# Patient Record
Sex: Female | Born: 1935 | Race: White | Hispanic: No | Smoking: Never smoker
Health system: Southern US, Community
[De-identification: ages and names within clinical notes are randomized; demographics above are authoritative.]

## PROBLEM LIST (undated history)

## (undated) DIAGNOSIS — K648 Other hemorrhoids: Secondary | ICD-10-CM

## (undated) DIAGNOSIS — I10 Essential (primary) hypertension: Secondary | ICD-10-CM

## (undated) DIAGNOSIS — I509 Heart failure, unspecified: Secondary | ICD-10-CM

## (undated) DIAGNOSIS — M1990 Unspecified osteoarthritis, unspecified site: Secondary | ICD-10-CM

## (undated) DIAGNOSIS — Z86010 Personal history of colonic polyps: Secondary | ICD-10-CM

## (undated) DIAGNOSIS — M4800 Spinal stenosis, site unspecified: Secondary | ICD-10-CM

## (undated) DIAGNOSIS — K5792 Diverticulitis of intestine, part unspecified, without perforation or abscess without bleeding: Secondary | ICD-10-CM

## (undated) DIAGNOSIS — I272 Pulmonary hypertension, unspecified: Principal | ICD-10-CM

## (undated) DIAGNOSIS — I4891 Unspecified atrial fibrillation: Secondary | ICD-10-CM

## (undated) DIAGNOSIS — N390 Urinary tract infection, site not specified: Secondary | ICD-10-CM

## (undated) DIAGNOSIS — R32 Unspecified urinary incontinence: Secondary | ICD-10-CM

## (undated) HISTORY — DX: Urinary tract infection, site not specified: N39.0

## (undated) HISTORY — DX: Unspecified urinary incontinence: R32

## (undated) HISTORY — DX: Personal history of colonic polyps: Z86.010

## (undated) HISTORY — DX: Pulmonary hypertension, unspecified: I27.20

## (undated) HISTORY — DX: Essential (primary) hypertension: I10

## (undated) HISTORY — DX: Unspecified atrial fibrillation: I48.91

## (undated) HISTORY — PX: CATARACT EXTRACTION: SUR2

## (undated) HISTORY — DX: Unspecified osteoarthritis, unspecified site: M19.90

## (undated) HISTORY — DX: Other hemorrhoids: K64.8

## (undated) HISTORY — DX: Spinal stenosis, site unspecified: M48.00

## (undated) HISTORY — DX: Heart failure, unspecified: I50.9

## (undated) HISTORY — DX: Diverticulitis of intestine, part unspecified, without perforation or abscess without bleeding: K57.92

## (undated) HISTORY — PX: VAGINAL HYSTERECTOMY: SHX2639

---
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
## 1968-03-14 HISTORY — PX: GALLBLADDER SURGERY: SHX652

## 2008-03-14 HISTORY — PX: BREAST BIOPSY: SHX20

## 2014-10-20 ENCOUNTER — Encounter: Payer: Self-pay | Admitting: Adult Health

## 2014-10-20 ENCOUNTER — Ambulatory Visit (INDEPENDENT_AMBULATORY_CARE_PROVIDER_SITE_OTHER): Payer: Medicare Other | Admitting: Adult Health

## 2014-10-20 VITALS — BP 138/80 | Temp 98.5°F | Ht 63.25 in | Wt 183.0 lb

## 2014-10-20 DIAGNOSIS — M1990 Unspecified osteoarthritis, unspecified site: Secondary | ICD-10-CM

## 2014-10-20 DIAGNOSIS — Z7189 Other specified counseling: Secondary | ICD-10-CM

## 2014-10-20 DIAGNOSIS — I509 Heart failure, unspecified: Secondary | ICD-10-CM

## 2014-10-20 DIAGNOSIS — N390 Urinary tract infection, site not specified: Secondary | ICD-10-CM

## 2014-10-20 DIAGNOSIS — I50813 Acute on chronic right heart failure: Secondary | ICD-10-CM | POA: Insufficient documentation

## 2014-10-20 DIAGNOSIS — I482 Chronic atrial fibrillation, unspecified: Secondary | ICD-10-CM

## 2014-10-20 DIAGNOSIS — Z7689 Persons encountering health services in other specified circumstances: Secondary | ICD-10-CM

## 2014-10-20 DIAGNOSIS — I10 Essential (primary) hypertension: Secondary | ICD-10-CM

## 2014-10-20 LAB — POCT URINALYSIS DIPSTICK
Bilirubin, UA: NEGATIVE
GLUCOSE UA: NEGATIVE
KETONES UA: NEGATIVE
Nitrite, UA: NEGATIVE
Urobilinogen, UA: 0.2
pH, UA: 5.5

## 2014-10-20 MED ORDER — METOPROLOL SUCCINATE ER 50 MG PO TB24: 50.0000 mg | ORAL_TABLET | Freq: Every day | ORAL | Status: DC

## 2014-10-20 MED ORDER — METOPROLOL SUCCINATE ER 25 MG PO TB24: 25.0000 mg | ORAL_TABLET | Freq: Every day | ORAL | Status: DC

## 2014-10-20 MED ORDER — LISINOPRIL 10 MG PO TABS: 10.0000 mg | ORAL_TABLET | Freq: Every day | ORAL | Status: DC

## 2014-10-20 MED ORDER — FUROSEMIDE 40 MG PO TABS: 40.0000 mg | ORAL_TABLET | Freq: Every day | ORAL | Status: DC

## 2014-10-20 MED ORDER — ELIQUIS 5 MG PO TABS: 5.0000 mg | ORAL_TABLET | Freq: Two times a day (BID) | ORAL | Status: DC

## 2014-10-20 MED ORDER — CIPROFLOXACIN HCL 500 MG PO TABS: 500.0000 mg | ORAL_TABLET | Freq: Two times a day (BID) | ORAL | Status: DC

## 2014-10-20 MED ORDER — MELOXICAM 15 MG PO TABS: 7.5000 mg | ORAL_TABLET | Freq: Every day | ORAL | Status: DC

## 2014-10-20 NOTE — Progress Notes (Signed)
HPI:  Alyssa Cameron is here to establish care. She is a very pleasant and spry 81 year old caucasian female.   Last PCP and physical: 1.5 years.   Has the following chronic problems that require follow up and concerns today:   Afib/CHF - She is currently taking Eliquis for her Afib and Lasix for her CHF. She was followed by Cardiology in [place] and would like to follow up with cardiology in [place]. She feels as though she is controlled on both medications.   Chronic UTI - Was followed by Urology in [place] and would like a referral placed for Urology in [place]. She does endorse having burning with urination currently. Denies any other symptoms.   Arthritis - She has arthritic pain in bilateral knees, right elbow and bilateral wrists. Is currently taking Tylenol for her arthritis.     ROS negative for unless reported above: fevers, chills,feeling poorly, unintentional weight loss, hearing or vision loss, chest pain, palpitations, leg claudication, struggling to breath,Not feeling congested in the chest, no orthopenia, no cough,no wheezing, normal appetite, no soft tissue swelling, no hemoptysis, melena, hematochezia, hematuria, falls, loc, si, or thoughts of self harm.  Immunizations: Needs PNA shingles. But unsure if had chickenpox.  Diet:eats a healthy diet Exercise: Does PT Colonoscopy:2 years ago - has hx polyps Dexa: "many years ago Mammogram:2014 Dentist: Will schedule.   No past medical history on file.  No past surgical history on file.  No family history on file.  History   Social History  . Marital Status: Widowed    Spouse Name: N/A  . Number of Children: N/A  . Years of Education: N/A   Social History Main Topics  . Smoking status: Never Smoker   . Smokeless tobacco: Not on file  . Alcohol Use: No  . Drug Use: No  . Sexual Activity: Not on file   Other Topics Concern  . Not on file   Social History Narrative  . No narrative on file     Current  outpatient prescriptions:  .  acetaminophen (TYLENOL) 500 MG tablet, Take 500 mg by mouth every 8 (eight) hours as needed., Disp: , Rfl:  .  ELIQUIS 5 MG TABS tablet, Take 5 mg by mouth 2 (two) times daily., Disp: , Rfl: 0 .  furosemide (LASIX) 40 MG tablet, Take 40 mg by mouth., Disp: , Rfl:  .  lisinopril (PRINIVIL,ZESTRIL) 10 MG tablet, Take 10 mg by mouth daily., Disp: , Rfl: 0 .  LORazepam (ATIVAN) 0.5 MG tablet, Take 0.5 mg by mouth at bedtime., Disp: , Rfl:  .  metoprolol succinate (TOPROL-XL) 25 MG 24 hr tablet, Take 1 tablet by mouth 3 (three) times daily., Disp: , Rfl: 0 .  metoprolol succinate (TOPROL-XL) 50 MG 24 hr tablet, Take 50 mg by mouth 3 (three) times daily., Disp: , Rfl: 0  EXAM:  Filed Vitals:   10/20/14 1250  BP: 138/80  Temp: 98.5 F (36.9 C)    Body mass index is 32.14 kg/(m^2).  GENERAL: vitals reviewed and listed above, alert, oriented, appears well hydrated and in no acute distress  HEENT: atraumatic, conjunttiva clear, no obvious abnormalities on inspection of external nose and ears  NECK: Neck is soft and supple without masses, no adenopathy or thyromegaly, trachea midline, no JVD. Normal range of motion.   LUNGS: clear to auscultation bilaterally, no wheezes, rales or rhonchi, good air movement  CV: A-fib,   no audible murmurs, gallops, or rubs. No carotid bruit and  slight peripheral edema.   MS: moves all extremities without noticeable abnormality. Has steady gate with walker. Can get onto exam table with liminal assistance  Abd: soft/nontender/nondistended/normal bowel sounds   Skin: warm and dry, no rash   Extremities: No clubbing, cyanosis, or edema. Capillary refill is WNL. Pulses intact bilaterally in upper and lower extremities.   Neuro: CN II-XII intact, sensation and reflexes normal throughout, 5/5 muscle strength in bilateral upper and lower extremities. Normal finger to nose. Normal rapid alternating movements.    PSYCH: pleasant and  cooperative, no obvious depression or anxiety  ASSESSMENT AND PLAN:  1. Encounter to establish care - Ambulatory referral to Cardiology - Ambulatory referral to Urology - POCT urinalysis dipstick - Follow up in one month for CPE  2. Recurrent UTI - POCT urinalysis dipstick; Standing - Culture, Urine - POCT urinalysis dipstick -- ciprofloxacin (CIPRO) 500 MG tablet; Take 1 tablet (500 mg total) by mouth 2 (two) times daily.  Dispense: 6 tablet; Refill: 0  3. Chronic atrial fibrillation - Ambulatory referral to Cardiology - ELIQUIS 5 MG TABS tablet; Take 1 tablet (5 mg total) by mouth 2 (two) times daily.  Dispense: 180 tablet; Refill: 1  4. Chronic congestive heart failure, unspecified congestive heart failure type  - Ambulatory referral to Cardiology - furosemide (LASIX) 40 MG tablet; Take 1 tablet (40 mg total) by mouth daily.  Dispense: 90 tablet; Refill: 1  5. Essential hypertension - Controlled - no change - Ambulatory referral to Cardiology - lisinopril (PRINIVIL,ZESTRIL) 10 MG tablet; Take 1 tablet (10 mg total) by mouth daily.  Dispense: 90 tablet; Refill: 1 - metoprolol succinate (TOPROL-XL) 25 MG 24 hr tablet; Take 1 tablet (25 mg total) by mouth daily.  Dispense: 90 tablet; Refill: 1 - metoprolol succinate (TOPROL-XL) 50 MG 24 hr tablet; Take 1 tablet (50 mg total) by mouth daily.  Dispense: 90 tablet; Refill: 1  6. Arthritis - meloxicam (MOBIC) 15 MG tablet; Take 0.5 tablets (7.5 mg total) by mouth daily.  Dispense: 30 tablet; Refill: 1   -We reviewed the PMH, PSH, FH, SH, Meds and Allergies. -We provided refills for any medications we will prescribe as needed. -We addressed current concerns per orders and patient instructions. -We have asked for records for pertinent exams, studies, vaccines and notes from previous providers. -We have advised patient to follow up per instructions below.   -Patient advised to return or notify a provider immediately if symptoms  worsen or persist or new concerns arise.  There are no Patient Instructions on file for this visit.   Shirline Frees, AGNP

## 2014-10-20 NOTE — Progress Notes (Signed)
Pre visit review using our clinic review tool, if applicable. No additional management support is needed unless otherwise documented below in the visit note. 

## 2014-10-20 NOTE — Patient Instructions (Signed)
It was so nice to meet you today!  Please follow up with me in one month for a physical.   I have placed the referrals and they will call you to make appointments.   I have sent a prescription for Mobic to the pharmacy, please take one half a pill as directed.   Please let me know if you need anything in the meantime.

## 2014-10-21 ENCOUNTER — Telehealth: Payer: Self-pay | Admitting: Adult Health

## 2014-10-21 NOTE — Telephone Encounter (Signed)
[facility] received orders for home health yesterday, they need copy of OV notes from yesterday and copy of demographic sheet and insurance card.  Fax:  [phone]

## 2014-10-21 NOTE — Telephone Encounter (Signed)
This has already been done.

## 2014-10-23 LAB — URINE CULTURE: Colony Count: >=100000

## 2014-10-24 ENCOUNTER — Telehealth: Payer: Self-pay | Admitting: Adult Health

## 2014-10-24 NOTE — Telephone Encounter (Signed)
Physical Therapist Byrd Hesselbach with [facility] calling to notify pcp that pt was seen and evaluated today for home health physical therapy.  Byrd Hesselbach is requesting a verbal order for strength training, transfer balance and gait training for 1 time a week for this week and then 2 times per week for the next 6 weeks starting next week.  Please call her back to provide verbal order.

## 2014-10-27 ENCOUNTER — Telehealth: Payer: Self-pay | Admitting: Adult Health

## 2014-10-27 DIAGNOSIS — I509 Heart failure, unspecified: Secondary | ICD-10-CM

## 2014-10-27 MED ORDER — FUROSEMIDE 40 MG PO TABS: 40.0000 mg | ORAL_TABLET | Freq: Every day | ORAL | Status: DC

## 2014-10-27 NOTE — Telephone Encounter (Signed)
ok 

## 2014-10-27 NOTE — Telephone Encounter (Signed)
Ok to give verbal 

## 2014-10-27 NOTE — Telephone Encounter (Signed)
Pt's rx furosemide (LASIX) 40 MG tablet [facility] but [facility] is [facility].  [facility]ne [facility] until 8/18 pt needs enough to get her through.  Also daughter has a med list from 2014 that states pt is taking potassium 20 MEQ dated 2014. Pt was taking one /day But not on current med list. . pls advise..  [facility] [place]

## 2014-10-27 NOTE — Telephone Encounter (Signed)
Rx sent to local pharmacy.  Cory please advise on potassium 20 meq. Per pt's daughter she has been on this medication continuously since 2014 and is running low on this medication  If approved, a 30 day supply to local pharmacy and 90 day to mail order.

## 2014-10-27 NOTE — Telephone Encounter (Signed)
OK to refill both. I saw that her next appointment in a 15 min follow up. This was supposed to be a CPE.

## 2014-10-28 NOTE — Telephone Encounter (Signed)
Called and left a verbal ok for pt's PT on personalized vm.

## 2014-10-28 NOTE — Telephone Encounter (Addendum)
Sorry about that [place], the appt notes states fup, but it was scheduled as a CPE. I corrected my mistake, it is definitely a physical. Again, I apologize!  By the way, is pt to have labs that day? Fasting?

## 2014-10-29 ENCOUNTER — Telehealth: Payer: Self-pay | Admitting: Adult Health

## 2014-10-29 MED ORDER — POTASSIUM CHLORIDE CRYS ER 20 MEQ PO TBCR: 20.0000 meq | EXTENDED_RELEASE_TABLET | Freq: Once | ORAL | Status: DC

## 2014-10-29 NOTE — Telephone Encounter (Signed)
error 

## 2014-10-29 NOTE — Telephone Encounter (Signed)
Spoke with pt and pt is aware

## 2014-10-29 NOTE — Telephone Encounter (Signed)
Rx sent to mail order for 90 day supply and 30 day to the local pharmacy. Per Kandee Keen pt does not need to fast that day.  We will get labs another day.

## 2014-10-30 ENCOUNTER — Encounter: Payer: Self-pay | Admitting: Adult Health

## 2014-10-30 ENCOUNTER — Telehealth: Payer: Self-pay

## 2014-10-30 NOTE — Telephone Encounter (Signed)
[facility] called to get verbal ordres for occupational therapy.  She would like to see the patient once for 1 week and then twice for 4 weeks.  Ok per [place] to give verbal order.  Malori is aware.

## 2014-10-31 ENCOUNTER — Telehealth: Payer: Self-pay | Admitting: Adult Health

## 2014-10-31 NOTE — Telephone Encounter (Signed)
Sonya from [facility] call to say pt need prior authorization for the following med ELIQUIS 5 MG TABS tablet

## 2014-10-31 NOTE — Telephone Encounter (Signed)
Pls advise.  

## 2014-11-03 NOTE — Telephone Encounter (Signed)
PA is pending with Cover My Meds. Key is [id number].

## 2014-11-03 NOTE — Telephone Encounter (Signed)
Pt following up on med request ELIQUIS 5 MG TABS tablet [facility]

## 2014-11-04 ENCOUNTER — Telehealth: Payer: Self-pay | Admitting: Adult Health

## 2014-11-04 NOTE — Telephone Encounter (Signed)
Called and spoke with pt and pt states the bottle says take 1/2 tablet by mouth daily.  Pt states her daughter wrote take 0.5 tablet bid.  Per Cory's last office note pt should take 0.5 tablet daily.  Also advised pt that this Mobic will take the place of Tylenol.  Advised pt to call the office with further questions or concerns.   Pt states before she came here she was taking MegTab 84 mg and she cannot find it down here.  Pt states [facility] told her they could order it.  Pt would like to know if she should be taking it at all? Pls advise.

## 2014-11-04 NOTE — Telephone Encounter (Signed)
Pt has questions about her rx and would like a call back   meloxicam (MOBIC) 15 MG tablet

## 2014-11-04 NOTE — Telephone Encounter (Signed)
We will check her Magnesium level during her physical.

## 2014-11-05 NOTE — Telephone Encounter (Signed)
Called and spoke with pt and pt is aware.  

## 2014-11-17 NOTE — Progress Notes (Signed)
Cardiology Office Note   Date:  11/18/2014   ID:  Alyssa Cameron, DOB 1918-07-15, MRN [id number]  PCP:  Shirline Frees, NP  Cardiologist:   Madilyn Hook, MD   Chief Complaint  Patient presents with  . Advice Only    NP- consult. here to get established.      History of Present Illness: Alyssa Cameron is a 81 y.o. female with hypertension, heart failure, and atrial fibrillation who presents to establish care.  Alyssa Cameron recently moved to [place] from [place] and presents to establish care.  She is feeling well and is without complaint at this time.  She was diagnosed with atrial fibrillation approximately 3 years ago.  At that time she presented with heart failure.  She is unsure whether this was systolic or diastolic heart failure.  After starting metoprolol and diuresis she has done much better.  She denies palpitations, lightheadedness, CP, shortness of breath, LE edema, orthopnea or PND.  She moved to [place] to live with her daughter who accompanies her today.  She has been taking apixaban for anticoagulation and denies any complications.  Prior to that she was on warfarin.     Past Medical History  Diagnosis Date  . Hypertension   . A-fib   . Spinal stenosis   . History of colon polyps   . CHF (congestive heart failure)   . Arthritis   . Urine incontinence   . Frequent UTI   . Diverticulitis   . Hx of colonic polyps     2013 - 2 tubular adenoma sigmoid and Transverse polyp  . Internal hemorrhoids     Past Surgical History  Procedure Laterality Date  . Cataract extraction    . Gallbladder surgery  1970  . Breast biopsy  2010  . Appendectomy  1941  . Tonsillectomy and adenoidectomy  1941  . Vaginal hysterectomy       Current Outpatient Prescriptions  Medication Sig Dispense Refill  . acetaminophen (TYLENOL) 500 MG tablet Take 500 mg by mouth every 8 (eight) hours as needed.    Marland Kitchen ELIQUIS 5 MG TABS tablet Take 1 tablet (5 mg total) by mouth 2 (two) times daily.  180 tablet 1  . furosemide (LASIX) 40 MG tablet Take 1 tablet (40 mg total) by mouth daily. 30 tablet 0  . lisinopril (PRINIVIL,ZESTRIL) 10 MG tablet Take 1 tablet (10 mg total) by mouth daily. 90 tablet 1  . Melatonin 3 MG TABS Take 1 tablet by mouth at bedtime as needed.    . meloxicam (MOBIC) 15 MG tablet Take 0.5 tablets (7.5 mg total) by mouth daily. 30 tablet 1  . metoprolol succinate (TOPROL-XL) 50 MG 24 hr tablet Take 1 tablet (50 mg total) by mouth daily. 90 tablet 1  . potassium chloride SA (K-DUR,KLOR-CON) 20 MEQ tablet Take 1 tablet (20 mEq total) by mouth once. 90 tablet 1   No current facility-administered medications for this visit.    Allergies:   Demerol and Vicodin    Social History:  The patient  reports that she has never smoked. She does not have any smokeless tobacco history on file. She reports that she does not drink alcohol or use illicit drugs.   Family History:  The patient's family history includes Breast cancer in an other family member; Heart failure in her father and mother; Lung cancer in an other family member; Stroke in an other family member.    ROS:  Please see the history of present  illness.  All other systems are reviewed and were positive for foot numbness and knee pain due to arthritis.  Otherwise review of systems was negative.    PHYSICAL EXAM: VS:  BP 152/74 mmHg  Pulse 98  Ht 5\' 6"  (1.676 m)  Wt 86.183 kg (190 lb)  BMI 30.68 kg/m2 , BMI Body mass index is 30.68 kg/(m^2). GENERAL:  Well appearing HEENT:  Pupils equal round and reactive, fundi not visualized, oral mucosa unremarkable NECK:  No jugular venous distention, waveform within normal limits, carotid upstroke brisk and symmetric, no bruits, no thyromegaly LYMPHATICS:  No cervical adenopathy LUNGS:  Clear to auscultation bilaterally HEART:  Irregularly irregular.  PMI not displaced or sustained,S1 and S2 within normal limits, no S3, no S4, no clicks, no rubs, no murmurs ABD:  Flat,  positive bowel sounds normal in frequency in pitch, no bruits, no rebound, no guarding, no midline pulsatile mass, no hepatomegaly, no splenomegaly EXT:  2 plus pulses throughout, no edema, no cyanosis no clubbing SKIN:  No rashes no nodules NEURO:  Cranial nerves II through XII grossly intact, motor grossly intact throughout PSYCH:  Cognitively intact, oriented to person place and time    EKG:  EKG is ordered today. The ekg ordered today demonstrates atrial fibrillation rate 98 bpm.  Low voltage precordial leads.     Recent Labs: No results found for requested labs within last 365 days.    Lipid Panel No results found for: CHOL, TRIG, HDL, CHOLHDL, VLDL, LDLCALC, LDLDIRECT    Wt Readings from Last 3 Encounters:  11/18/14 86.183 kg (190 lb)  10/20/14 83.008 kg (183 lb)      Other studies Reviewed: Additional studies/ records that were reviewed today include:  Review of the above records demonstrates:  Please see elsewhere in the note.   ASSESSMENT AND PLAN:  # Chronic Atrial fibrillation: Rate controlled and patient tolerating metoprolol and apixaban.  Continue current management.  # Hypertension: BP above goal today.  She brings a list of her home BP readings which have been within normal range.  We will not make any changes based on this one reading, as she reports some symptomatic low BPs in the past. - Continue metoprolol and lisinopril  - Patient to continue to monitor home BP  # Chronic heart failure, type unknown:  Euvolemic today.  Will obtain outside records. - Continue furosemide  - Continue metoprolol and lisinopril - check K and Mg to appropriately dose supplementation  #  CV disease prevention: Patient requests that lipids be checked today.  Current medicines are reviewed at length with the patient today.  The patient does not have concerns regarding medicines.  The following changes have been made:  no change  Labs/ tests ordered today include:    Orders Placed This Encounter  Procedures  . Comprehensive metabolic panel  . Lipid panel  . Magnesium  . EKG 12-Lead     Disposition:   FU with Dr. Elmarie Shiley C. Duke Salvia in 1 year   Signed, Madilyn Hook, MD  11/18/2014 8:36 PM    [facility]

## 2014-11-18 ENCOUNTER — Ambulatory Visit (INDEPENDENT_AMBULATORY_CARE_PROVIDER_SITE_OTHER): Payer: Medicare Other | Admitting: Cardiovascular Disease

## 2014-11-18 ENCOUNTER — Encounter: Payer: Self-pay | Admitting: Cardiovascular Disease

## 2014-11-18 VITALS — BP 152/74 | HR 98 | Ht 66.0 in | Wt 190.0 lb

## 2014-11-18 DIAGNOSIS — I482 Chronic atrial fibrillation, unspecified: Secondary | ICD-10-CM

## 2014-11-18 DIAGNOSIS — Z79899 Other long term (current) drug therapy: Secondary | ICD-10-CM

## 2014-11-18 DIAGNOSIS — I10 Essential (primary) hypertension: Secondary | ICD-10-CM | POA: Diagnosis not present

## 2014-11-18 NOTE — Patient Instructions (Signed)
Your physician recommends that you return for lab work fasting. You will receive results via phone call or by mail if you are not signed up with my chart.   Your physician wants you to follow-up in: 1 year or sooner if needed with Dr. Duke Salvia. You will receive a reminder letter in the mail two months in advance. If you don't receive a letter, please call our office to schedule the follow-up appointment.

## 2014-11-19 ENCOUNTER — Encounter: Payer: Self-pay | Admitting: Adult Health

## 2014-11-19 ENCOUNTER — Ambulatory Visit (INDEPENDENT_AMBULATORY_CARE_PROVIDER_SITE_OTHER): Payer: Medicare Other | Admitting: Adult Health

## 2014-11-19 VITALS — BP 120/80 | Temp 98.4°F | Ht 66.0 in | Wt 187.7 lb

## 2014-11-19 DIAGNOSIS — Z0000 Encounter for general adult medical examination without abnormal findings: Secondary | ICD-10-CM

## 2014-11-19 DIAGNOSIS — I10 Essential (primary) hypertension: Secondary | ICD-10-CM

## 2014-11-19 DIAGNOSIS — Z23 Encounter for immunization: Secondary | ICD-10-CM | POA: Diagnosis not present

## 2014-11-19 NOTE — Patient Instructions (Signed)
It was great seeing you again!  I will follow up with you regarding your labs when I get the results.   Come back in 6 months for a follow up. If you need anything in the meantime, please let me know.

## 2014-11-19 NOTE — Progress Notes (Signed)
Subjective:  Patient presents for yearly medicare wellness exam Medicare questionnaire was completed and reviewed  All immunizations and health maintenance protocols were reviewed with the patient and needed orders were placed.  Appropriate screening laboratory values were ordered for the patient including screening of hyperlipidemia, renal function and hepatic function.  Medication reconciliation,  past medical history, social history, problem list and allergies were reviewed in detail with the patient  Goals were established with regard to weight loss, exercise, and  diet in compliance with medications  End of life planning was discussed.   Preventive Screening-Counseling & Management  Smoking Status: Never Smoker Second Hand Smoking status: No smokers in home  Risk Factors Regular exercise: None Diet: Does not follow specific diet Fall Risk: Yes, impaired mobility  Cardiac risk factors:  advanced age (older than 66 for men, 55 for women)  Hyperlipidemia : no No diabetes.  Family History: Father and Mother had heart disease  Depression Screen None. PHQ2 0   Activities of Daily Living Independent ADLs and IADLs   Hearing Difficulties: Has trouble understanding soft voices  Cognitive Testing No reported trouble.   Normal 3 word recall  List the Names of Other Physician/Practitioners you currently use: 1.Cardiology - [facility]ilton Si 2. Optho - Dr. Wallace Cullens - [place], [place] 3. Urology- Dr. Annabell Howells  Immunization History  Administered Date(s) Administered  . Influenza, High Dose Seasonal PF 10/26/2014   Required Immunizations needed today: Prevnar 13  Screening tests- up to date Health Maintenance Due  Topic Date Due  . TETANUS/TDAP  01/31/1940  . ZOSTAVAX  01/30/1981  . DEXA SCAN  01/30/1986  . PNA vac Low Risk Adult (1 of 2 - PCV13) 01/30/1986    ROS- No pertinent positives discovered in course of AWV  The following were reviewed and  entered/updated in epic: Past Medical History  Diagnosis Date  . Hypertension   . A-fib   . Spinal stenosis   . History of colon polyps   . CHF (congestive heart failure)   . Arthritis   . Urine incontinence   . Frequent UTI   . Diverticulitis   . Hx of colonic polyps     2013 - 2 tubular adenoma sigmoid and Transverse polyp  . Internal hemorrhoids    Patient Active Problem List   Diagnosis Date Noted  . A-fib 10/20/2014  . CHF (congestive heart failure) 10/20/2014  . Essential hypertension 10/20/2014  . Arthritis 10/20/2014  . Chronic UTI 10/20/2014   Past Surgical History  Procedure Laterality Date  . Cataract extraction    . Gallbladder surgery  1970  . Breast biopsy  2010  . Appendectomy  1941  . Tonsillectomy and adenoidectomy  1941  . Vaginal hysterectomy      Family History  Problem Relation Age of Onset  . Heart failure Mother     73  . Heart failure Father     38  . Breast cancer    . Lung cancer    . Stroke      Medications- reviewed and updated Current Outpatient Prescriptions  Medication Sig Dispense Refill  . acetaminophen (TYLENOL) 500 MG tablet Take 500 mg by mouth every 8 (eight) hours as needed.    Marland Kitchen ELIQUIS 5 MG TABS tablet Take 1 tablet (5 mg total) by mouth 2 (two) times daily. 180 tablet 1  . furosemide (LASIX) 40 MG tablet Take 1 tablet (40 mg total) by mouth daily. 30 tablet 0  . lisinopril (PRINIVIL,ZESTRIL) 10 MG tablet  Take 1 tablet (10 mg total) by mouth daily. 90 tablet 1  . Melatonin 3 MG TABS Take 1 tablet by mouth at bedtime as needed.    . meloxicam (MOBIC) 15 MG tablet Take 0.5 tablets (7.5 mg total) by mouth daily. 30 tablet 1  . metoprolol succinate (TOPROL-XL) 50 MG 24 hr tablet Take 1 tablet (50 mg total) by mouth daily. 90 tablet 1  . potassium chloride SA (K-DUR,KLOR-CON) 20 MEQ tablet Take 1 tablet (20 mEq total) by mouth once. 90 tablet 1   No current facility-administered medications for this visit.     Allergies-reviewed and updated Allergies  Allergen Reactions  . Demerol [Meperidine] Nausea Only  . Vicodin [Hydrocodone-Acetaminophen] Nausea And Vomiting    Social History   Social History  . Marital Status: Widowed    Spouse Name: N/A  . Number of Children: N/A  . Years of Education: N/A   Social History Main Topics  . Smoking status: Never Smoker   . Smokeless tobacco: None  . Alcohol Use: No  . Drug Use: No  . Sexual Activity: Not Asked   Other Topics Concern  . None   Social History Narrative   Retired from [employer] in [place] and moved here from [place]   2 daughter and 2 sons   Likes to read and do puzzles    Objective: BP 120/80 mmHg  Temp(Src) 98.4 F (36.9 C) (Oral)  Ht 5\' 6"  (1.676 m)  Wt 187 lb 11.2 oz (85.14 kg)  BMI 30.31 kg/m2 GENERAL: vitals reviewed and listed above, alert, oriented, appears well hydrated and in no acute distress  HEENT: atraumatic, conjunttiva clear, no obvious abnormalities on inspection of external nose and ears. TM's visualized, no cerumen impaction.   NECK: Neck is soft and supple without masses, no adenopathy or thyromegaly, trachea midline, no JVD. Normal range of motion.   LUNGS: clear to auscultation bilaterally, no wheezes, rales or rhonchi, good air movement  CV: A-fib,no audible murmurs, gallops, or rubs. No carotid bruit and slight peripheral edema.   MS: moves all extremities without noticeable abnormality. Has steady gate with walker. Can get onto exam table with minimal assistance  Abd: soft/nontender/nondistended/normal bowel sounds   Skin: warm and dry, no rash   Extremities: No clubbing, cyanosis, or edema. Capillary refill is WNL. Pulses intact bilaterally in upper and lower extremities.   Neuro: CN II-XII intact, sensation and reflexes normal throughout, 5/5 muscle strength in bilateral upper and lower extremities. Normal finger to nose. Normal rapid alternating movements.   PSYCH: pleasant  and cooperative, no obvious depression or anxiety  Assessment/Plan:  1. Encounter for Medicare annual wellness exam - Follow up in one year for MWE - Follow up in 6 months for check up - Follow up sooner if needed  2. Essential hypertension - Controlled with current regimen - no change - CBC with Differential - Hepatic function panel - TSH  3. Need for pneumococcal vaccination - Pneumococcal conjugate vaccine 13-valent IM   This is a list of the screening recommended for you and due dates:  Health Maintenance  Topic Date Due  . Tetanus Vaccine  01/31/1940  . Shingles Vaccine  01/30/1981  . DEXA scan (bone density measurement)  01/30/1986  . Flu Shot  10/13/2015  . Pneumonia vaccines (2 of 2 - PPSV23) 11/19/2015  . Colon Cancer Screening  01/18/2022      Return precautions advised.

## 2014-11-21 ENCOUNTER — Other Ambulatory Visit: Payer: Self-pay | Admitting: Adult Health

## 2014-11-21 LAB — HEPATIC FUNCTION PANEL
ALK PHOS: 76 U/L (ref 33–130)
ALT: 22 U/L (ref 6–29)
AST: 23 U/L (ref 10–35)
Albumin: 4 g/dL (ref 3.6–5.1)
BILIRUBIN INDIRECT: 0.5 mg/dL (ref 0.2–1.2)
BILIRUBIN TOTAL: 0.7 mg/dL (ref 0.2–1.2)
Bilirubin, Direct: 0.2 mg/dL (ref <=0.2)
Total Protein: 6.5 g/dL (ref 6.1–8.1)

## 2014-11-21 LAB — CBC WITH DIFFERENTIAL/PLATELET
BASOS ABS: 0 10*3/uL (ref 0.0–0.1)
BASOS PCT: 0 % (ref 0–1)
EOS ABS: 0.1 10*3/uL (ref 0.0–0.7)
Eosinophils Relative: 2 % (ref 0–5)
HCT: 36 % (ref 36.0–46.0)
Hemoglobin: 11.7 g/dL — ABNORMAL LOW (ref 12.0–15.0)
LYMPHS ABS: 1 10*3/uL (ref 0.7–4.0)
Lymphocytes Relative: 17 % (ref 12–46)
MCH: 31 pg (ref 26.0–34.0)
MCHC: 32.5 g/dL (ref 30.0–36.0)
MCV: 95.5 fL (ref 78.0–100.0)
MPV: 10.4 fL (ref 8.6–12.4)
Monocytes Absolute: 0.4 10*3/uL (ref 0.1–1.0)
Monocytes Relative: 6 % (ref 3–12)
NEUTROS ABS: 4.4 10*3/uL (ref 1.7–7.7)
NEUTROS PCT: 75 % (ref 43–77)
PLATELETS: 174 10*3/uL (ref 150–400)
RBC: 3.77 MIL/uL — ABNORMAL LOW (ref 3.87–5.11)
RDW: 13.4 % (ref 11.5–15.5)
WBC: 5.9 10*3/uL (ref 4.0–10.5)

## 2014-11-21 LAB — TSH: TSH: 1.609 u[IU]/mL (ref 0.350–4.500)

## 2014-11-22 LAB — LIPID PANEL
CHOLESTEROL: 159 mg/dL (ref 125–200)
HDL: 56 mg/dL (ref >=46)
LDL CALC: 83 mg/dL (ref <130)
Total CHOL/HDL Ratio: 2.8 Ratio (ref <=5.0)
Triglycerides: 98 mg/dL (ref <150)
VLDL: 20 mg/dL (ref <30)

## 2014-11-22 LAB — URINALYSIS, ROUTINE W REFLEX MICROSCOPIC
Bilirubin Urine: NEGATIVE
Glucose, UA: NEGATIVE
Ketones, ur: NEGATIVE
NITRITE: NEGATIVE
PH: 5 (ref 5.0–8.0)
Protein, ur: NEGATIVE
SPECIFIC GRAVITY, URINE: 1.017 (ref 1.001–1.035)

## 2014-11-22 LAB — URINALYSIS, MICROSCOPIC ONLY
BACTERIA UA: NONE SEEN [HPF]
CRYSTALS: NONE SEEN [HPF]
Casts: NONE SEEN [LPF]
Yeast: NONE SEEN [HPF]

## 2014-11-22 LAB — COMPREHENSIVE METABOLIC PANEL
ALBUMIN: 4.3 g/dL (ref 3.6–5.1)
ALT: 21 U/L (ref 6–29)
AST: 22 U/L (ref 10–35)
Alkaline Phosphatase: 83 U/L (ref 33–130)
BUN: 30 mg/dL — ABNORMAL HIGH (ref 7–25)
CHLORIDE: 103 mmol/L (ref 98–110)
CO2: 27 mmol/L (ref 20–31)
Calcium: 9.7 mg/dL (ref 8.6–10.4)
Creat: 1.02 mg/dL — ABNORMAL HIGH (ref 0.60–0.88)
Glucose, Bld: 145 mg/dL — ABNORMAL HIGH (ref 65–99)
POTASSIUM: 4.7 mmol/L (ref 3.5–5.3)
Sodium: 139 mmol/L (ref 135–146)
TOTAL PROTEIN: 6.3 g/dL (ref 6.1–8.1)
Total Bilirubin: 0.7 mg/dL (ref 0.2–1.2)

## 2014-11-22 LAB — MAGNESIUM: MAGNESIUM: 1.9 mg/dL (ref 1.5–2.5)

## 2014-11-25 ENCOUNTER — Other Ambulatory Visit: Payer: Self-pay | Admitting: Adult Health

## 2014-11-25 MED ORDER — CIPROFLOXACIN HCL 250 MG PO TABS: 250.0000 mg | ORAL_TABLET | Freq: Two times a day (BID) | ORAL | Status: DC

## 2014-11-25 NOTE — Addendum Note (Signed)
Addended by: Azucena Freed on: 11/25/2014 03:23 PM   Modules accepted: Orders, Medications

## 2014-11-26 ENCOUNTER — Other Ambulatory Visit: Payer: Self-pay | Admitting: Adult Health

## 2014-12-01 ENCOUNTER — Encounter: Payer: Self-pay | Admitting: Adult Health

## 2014-12-01 ENCOUNTER — Telehealth: Payer: Self-pay | Admitting: Adult Health

## 2014-12-01 ENCOUNTER — Ambulatory Visit (INDEPENDENT_AMBULATORY_CARE_PROVIDER_SITE_OTHER): Payer: Medicare Other | Admitting: Adult Health

## 2014-12-01 VITALS — BP 130/70 | HR 82 | Wt 193.6 lb

## 2014-12-01 DIAGNOSIS — R600 Localized edema: Secondary | ICD-10-CM | POA: Diagnosis not present

## 2014-12-01 DIAGNOSIS — G479 Sleep disorder, unspecified: Secondary | ICD-10-CM

## 2014-12-01 MED ORDER — SPIRONOLACTONE 25 MG PO TABS: 25.0000 mg | ORAL_TABLET | Freq: Every day | ORAL | Status: DC

## 2014-12-01 NOTE — Telephone Encounter (Signed)
Referral to see Chilton Si for a echocardiogram

## 2014-12-01 NOTE — Patient Instructions (Addendum)
It was great seeing you again!  - Please follow up with cardiology and let them know that you have more edema in your legs and see if they want to do an Echocardiogram.   -I will start you on a medication call Aldactone. Take this every other day. Stay hydrated   - Try taking 6 mg of Melatonin   - Follow up in 1-2 weeks with me.

## 2014-12-01 NOTE — Telephone Encounter (Signed)
Pt needs a referral from [facility] in order to see Dr Chilton Si for the echocardiogram.  Phone no: [phone]

## 2014-12-01 NOTE — Progress Notes (Signed)
Subjective:    Patient ID: Alyssa Cameron, female    DOB: Jan 14, 1919, 81 y.o.   MRN: [id number]  HPI  81 year old female presents to the office today for two issues.    1) Insomnia - She feels as though she is not getting enough sleep. She is taking 3 mg Melatonin and goes to bed around 10-10:30 and then wakes up at 2 am and is unable to fall back to sleep. She has always had issues with sleeping.   2)Lower extremity edema.  - Over the last week she has had increased swelling in bilateral lower extremities. She endorses taking her Lasix as prescribed. Denies any loss of sensation in feet, no calf pain and no calf warmness. The swelling is better after waking up in the morning.   Review of Systems  Constitutional: Positive for fatigue.  HENT: Negative.   Respiratory: Negative.   Cardiovascular: Positive for leg swelling. Negative for chest pain and palpitations.  Gastrointestinal: Negative.   Genitourinary: Negative.   Musculoskeletal: Positive for arthralgias.  Skin: Negative.   Neurological: Negative.   Psychiatric/Behavioral: Positive for sleep disturbance.  All other systems reviewed and are negative.  Past Medical History  Diagnosis Date  . Hypertension   . A-fib   . Spinal stenosis   . History of colon polyps   . CHF (congestive heart failure)   . Arthritis   . Urine incontinence   . Frequent UTI   . Diverticulitis   . Hx of colonic polyps     2013 - 2 tubular adenoma sigmoid and Transverse polyp  . Internal hemorrhoids     Social History   Social History  . Marital Status: Widowed    Spouse Name: N/A  . Number of Children: N/A  . Years of Education: N/A   Occupational History  . Not on file.   Social History Main Topics  . Smoking status: Never Smoker   . Smokeless tobacco: Not on file  . Alcohol Use: No  . Drug Use: No  . Sexual Activity: Not on file   Other Topics Concern  . Not on file   Social History Narrative   Retired from [employer] in  [place] and moved here from [place]   2 daughter and 2 sons   Likes to read and do puzzles    Past Surgical History  Procedure Laterality Date  . Cataract extraction    . Gallbladder surgery  1970  . Breast biopsy  2010  . Appendectomy  1941  . Tonsillectomy and adenoidectomy  1941  . Vaginal hysterectomy      Family History  Problem Relation Age of Onset  . Heart failure Mother     84  . Heart failure Father     16  . Breast cancer    . Lung cancer    . Stroke      Allergies  Allergen Reactions  . Demerol [Meperidine] Nausea Only  . Vicodin [Hydrocodone-Acetaminophen] Nausea And Vomiting    Current Outpatient Prescriptions on File Prior to Visit  Medication Sig Dispense Refill  . acetaminophen (TYLENOL) 500 MG tablet Take 500 mg by mouth every 8 (eight) hours as needed.    Marland Kitchen ELIQUIS 5 MG TABS tablet Take 1 tablet (5 mg total) by mouth 2 (two) times daily. 180 tablet 1  . furosemide (LASIX) 40 MG tablet Take 1 tablet (40 mg total) by mouth daily. 30 tablet 0  . lisinopril (PRINIVIL,ZESTRIL) 10 MG  tablet Take 1 tablet (10 mg total) by mouth daily. 90 tablet 1  . Melatonin 3 MG TABS Take 1 tablet by mouth at bedtime as needed.    . meloxicam (MOBIC) 15 MG tablet Take 0.5 tablets (7.5 mg total) by mouth daily. 30 tablet 1  . metoprolol succinate (TOPROL-XL) 50 MG 24 hr tablet Take 1 tablet (50 mg total) by mouth daily. 90 tablet 1  . potassium chloride SA (K-DUR,KLOR-CON) 20 MEQ tablet TAKE 1 TABLET BY MOUTH EVERY DAY 90 tablet 1   No current facility-administered medications on file prior to visit.    BP 130/70 mmHg  Pulse 82  Wt 193 lb 9.6 oz (87.816 kg)  SpO2 96%       Objective:   Physical Exam  Constitutional: She is oriented to person, place, and time. She appears well-developed and well-nourished. No distress.  Cardiovascular: Normal rate, regular rhythm, normal heart sounds and intact distal pulses.  Exam reveals no friction rub.   No murmur  heard. Pulmonary/Chest: Effort normal and breath sounds normal. No respiratory distress. She has no wheezes. She has no rales. She exhibits no tenderness.  Musculoskeletal: Normal range of motion. She exhibits edema and tenderness.  +1 pitting edema to bilateral lower extremities. No redness or warmth in calf.   Neurological: She is alert and oriented to person, place, and time. She has normal reflexes.  Skin: Skin is warm and dry. No rash noted. She is not diaphoretic. No erythema. No pallor.  Psychiatric: She has a normal mood and affect. Her behavior is normal. Judgment and thought content normal.  Nursing note and vitals reviewed.     Assessment & Plan:  .1. Sleep disturbance - Increase Melatonin to 6 mg nightly as needed - Take medication earlier in the evening and go to bed at an earlier time.  - Minimize distraction in room  2. Bilateral edema of lower extremity - She has a history of CHF, has not had echo done in " a long time".  - spironolactone (ALDACTONE) 25 MG tablet; Take 1 tablet (25 mg total) by mouth daily.  Dispense: 30 tablet; Refill: 3. Take every other day - Follow up with Dr. Duke Salvia with cardiology for possible echo.  - Follow up in 1-2 weeks for recheck and lab draw -

## 2014-12-01 NOTE — Progress Notes (Signed)
Pre visit review using our clinic review tool, if applicable. No additional management support is needed unless otherwise documented below in the visit note. 

## 2014-12-02 ENCOUNTER — Telehealth: Payer: Self-pay | Admitting: *Deleted

## 2014-12-02 NOTE — Telephone Encounter (Signed)
-----   Message from Chilton Si, MD sent at 11/28/2014  4:44 PM EDT ----- Electrolytes are normal.  No changes recommended.

## 2014-12-02 NOTE — Telephone Encounter (Signed)
Left message to call back on answer machine 

## 2014-12-02 NOTE — Telephone Encounter (Signed)
Spoke to patient. Result given . Verbalized understanding  

## 2014-12-03 ENCOUNTER — Other Ambulatory Visit: Payer: Self-pay | Admitting: Adult Health

## 2014-12-03 DIAGNOSIS — M7989 Other specified soft tissue disorders: Secondary | ICD-10-CM

## 2014-12-03 DIAGNOSIS — I509 Heart failure, unspecified: Secondary | ICD-10-CM

## 2014-12-03 NOTE — Telephone Encounter (Signed)
Order entered

## 2014-12-15 ENCOUNTER — Encounter: Payer: Self-pay | Admitting: Adult Health

## 2014-12-15 ENCOUNTER — Ambulatory Visit (INDEPENDENT_AMBULATORY_CARE_PROVIDER_SITE_OTHER): Payer: Medicare Other | Admitting: Adult Health

## 2014-12-15 VITALS — Temp 98.5°F | Ht 66.0 in | Wt 189.5 lb

## 2014-12-15 DIAGNOSIS — M1990 Unspecified osteoarthritis, unspecified site: Secondary | ICD-10-CM

## 2014-12-15 DIAGNOSIS — I509 Heart failure, unspecified: Secondary | ICD-10-CM

## 2014-12-15 DIAGNOSIS — R600 Localized edema: Secondary | ICD-10-CM | POA: Diagnosis not present

## 2014-12-15 DIAGNOSIS — I10 Essential (primary) hypertension: Secondary | ICD-10-CM | POA: Diagnosis not present

## 2014-12-15 LAB — BASIC METABOLIC PANEL
BUN: 32 mg/dL — ABNORMAL HIGH (ref 6–23)
CHLORIDE: 103 meq/L (ref 96–112)
CO2: 28 meq/L (ref 19–32)
Calcium: 9.9 mg/dL (ref 8.4–10.5)
Creatinine, Ser: 1.31 mg/dL — ABNORMAL HIGH (ref 0.40–1.20)
GFR: 40.19 mL/min — ABNORMAL LOW (ref >60.00)
GLUCOSE: 115 mg/dL — AB (ref 70–99)
Potassium: 5.2 mEq/L — ABNORMAL HIGH (ref 3.5–5.1)
SODIUM: 140 meq/L (ref 135–145)

## 2014-12-15 MED ORDER — MELOXICAM 15 MG PO TABS: 15.0000 mg | ORAL_TABLET | Freq: Every day | ORAL | Status: DC

## 2014-12-15 MED ORDER — FUROSEMIDE 40 MG PO TABS: 40.0000 mg | ORAL_TABLET | Freq: Every day | ORAL | Status: DC

## 2014-12-15 NOTE — Patient Instructions (Signed)
It was great seeing you again!  Cut the lisinopril in half and see how your blood pressure reacts to that. If it starts to get close to 140s/90, please let me know.   You can stop the spirolactone and see what happens with the swelling.   Take one full tab of Mobic and then take tylenol in the afternoon and evening if needed. If your pain is not controlled, please let me know.   I will see you in March!

## 2014-12-15 NOTE — Progress Notes (Signed)
Pre visit review using our clinic review tool, if applicable. No additional management support is needed unless otherwise documented below in the visit note. 

## 2014-12-15 NOTE — Progress Notes (Signed)
Subjective:    Patient ID: Alyssa Cameron, female    DOB: 1918/04/22, 81 y.o.   MRN: [id number]  HPI  81 year old female who presents to the office today for two week follow up after starting Spirolactone. She continues to complain of constant arthritic pain in her lower extremities. She is currently taking 7.5 mg Mobic twice a day.This is not controlling her pain. Has been adding one gram tylenol as needed, which helps.   She also endorses that her edema in her legs has improved.   Review of Systems  Constitutional: Negative.   Respiratory: Negative.   Cardiovascular: Positive for leg swelling.  Gastrointestinal: Negative.   Musculoskeletal: Positive for arthralgias and gait problem.  Neurological: Negative.   All other systems reviewed and are negative.  Past Medical History  Diagnosis Date  . Hypertension   . A-fib (HCC)   . Spinal stenosis   . History of colon polyps   . CHF (congestive heart failure) (HCC)   . Arthritis   . Urine incontinence   . Frequent UTI   . Diverticulitis   . Hx of colonic polyps     2013 - 2 tubular adenoma sigmoid and Transverse polyp  . Internal hemorrhoids     Social History   Social History  . Marital Status: Widowed    Spouse Name: N/A  . Number of Children: N/A  . Years of Education: N/A   Occupational History  . Not on file.   Social History Main Topics  . Smoking status: Never Smoker   . Smokeless tobacco: Not on file  . Alcohol Use: No  . Drug Use: No  . Sexual Activity: Not on file   Other Topics Concern  . Not on file   Social History Narrative   Retired from [employer] in [place] and moved here from [place]   2 daughter and 2 sons   Likes to read and do puzzles    Past Surgical History  Procedure Laterality Date  . Cataract extraction    . Gallbladder surgery  1970  . Breast biopsy  2010  . Appendectomy  1941  . Tonsillectomy and adenoidectomy  1941  . Vaginal hysterectomy      Family History  Problem  Relation Age of Onset  . Heart failure Mother     6  . Heart failure Father     44  . Breast cancer    . Lung cancer    . Stroke      Allergies  Allergen Reactions  . Demerol [Meperidine] Nausea Only  . Vicodin [Hydrocodone-Acetaminophen] Nausea And Vomiting    Current Outpatient Prescriptions on File Prior to Visit  Medication Sig Dispense Refill  . acetaminophen (TYLENOL) 500 MG tablet Take 500 mg by mouth every 8 (eight) hours as needed.    Marland Kitchen ELIQUIS 5 MG TABS tablet Take 1 tablet (5 mg total) by mouth 2 (two) times daily. 180 tablet 1  . lisinopril (PRINIVIL,ZESTRIL) 10 MG tablet Take 1 tablet (10 mg total) by mouth daily. 90 tablet 1  . Melatonin 3 MG TABS Take 1 tablet by mouth at bedtime as needed.    . metoprolol succinate (TOPROL-XL) 50 MG 24 hr tablet Take 1 tablet (50 mg total) by mouth daily. 90 tablet 1  . potassium chloride SA (K-DUR,KLOR-CON) 20 MEQ tablet TAKE 1 TABLET BY MOUTH EVERY DAY 90 tablet 1   No current facility-administered medications on file prior to visit.  Temp(Src) 98.5 F (36.9 C) (Oral)  Ht  (1.676 m)  Wt 189 lb 8 oz (85.957 kg)  BMI 30.60 kg/m2       Objective:   Physical Exam  Constitutional: She is oriented to person, place, and time. She appears well-developed and well-nourished. No distress.  Cardiovascular: Normal rate, regular rhythm, normal heart sounds and intact distal pulses.  Exam reveals no gallop and no friction rub.   No murmur heard. Pulmonary/Chest: Effort normal and breath sounds normal. No respiratory distress. She has no wheezes. She has no rales. She exhibits no tenderness.  Musculoskeletal: Normal range of motion. She exhibits edema (Non pitting edema in bilateral lower extremities. Has improved since last visit. ). She exhibits no tenderness.  Neurological: She is alert and oriented to person, place, and time. She has normal reflexes.  Skin: Skin is warm and dry. No rash noted. She is not diaphoretic. No  erythema. No pallor.  Psychiatric: She has a normal mood and affect. Her behavior is normal. Judgment and thought content normal.  Nursing note and vitals reviewed.      Assessment & Plan:  1. Bilateral edema of lower extremity - Can d/c Aldactone as this time and see how her edema does.  - Basic metabolic panel  - furosemide (LASIX) 40 MG tablet; Take 1 tablet (40 mg total) by mouth daily.  Dispense: 90 tablet; Refill: 2  2. Arthritis - Can supplement with one gram tylenol in afternoon and evening.  - meloxicam (MOBIC) 15 MG tablet; Take 1 tablet (15 mg total) by mouth daily.  Dispense: 90 tablet; Refill: 1 - Add capsaicin cream   4. Essential hypertension - Her blood pressure has been low at home, the lowest they have see has been 90/70's.  - Take  lisinopril instead of 10 mg. Continue to monitor blood pressure as needed.

## 2014-12-18 ENCOUNTER — Other Ambulatory Visit: Payer: Self-pay

## 2014-12-18 ENCOUNTER — Ambulatory Visit (HOSPITAL_COMMUNITY): Payer: Medicare Other | Attending: Adult Health

## 2014-12-18 DIAGNOSIS — I340 Nonrheumatic mitral (valve) insufficiency: Secondary | ICD-10-CM | POA: Insufficient documentation

## 2014-12-18 DIAGNOSIS — I517 Cardiomegaly: Secondary | ICD-10-CM | POA: Insufficient documentation

## 2014-12-18 DIAGNOSIS — I071 Rheumatic tricuspid insufficiency: Secondary | ICD-10-CM | POA: Diagnosis not present

## 2014-12-18 DIAGNOSIS — I509 Heart failure, unspecified: Secondary | ICD-10-CM | POA: Insufficient documentation

## 2014-12-18 DIAGNOSIS — I10 Essential (primary) hypertension: Secondary | ICD-10-CM | POA: Diagnosis not present

## 2014-12-18 DIAGNOSIS — M7989 Other specified soft tissue disorders: Secondary | ICD-10-CM

## 2014-12-22 ENCOUNTER — Telehealth: Payer: Self-pay | Admitting: *Deleted

## 2014-12-22 NOTE — Telephone Encounter (Signed)
Left message to call --needs appointment

## 2014-12-22 NOTE — Telephone Encounter (Signed)
-----   Message from Chilton Si, MD sent at 12/21/2014 11:31 AM EDT ----- The blood pressure in her lungs is quite elevated.  Please schedule follow up in 2-4 weeks.

## 2014-12-23 NOTE — Telephone Encounter (Signed)
Returning your call. °

## 2014-12-23 NOTE — Telephone Encounter (Signed)
Spoke to patient. Result given . Verbalized understanding Appointment 01/07/15

## 2014-12-23 NOTE — Telephone Encounter (Signed)
Left message to call back  

## 2015-01-06 NOTE — Progress Notes (Signed)
Cardiology Office Note   Date:  01/07/2015   ID:  Alyssa Cameron, DOB 1918-07-01, MRN [id number]  PCP:  Shirline Frees, NP  Cardiologist:   Madilyn Hook, MD   Chief Complaint  Patient presents with  . Follow-up    BP elevated//ECHO//pt states no Sx.     Interval History 01/05/14: Alyssa Cameron is a 81 y.o. female with hypertension, heart failure, and atrial fibrillation who presents to for follow up on her echo.  Alyssa Cameron recently moved to [place] from [place] and was seen in clinic on September 5th to establish care. At that appointment she was feeling well and was without complaint. She had previously been diagnosed with heart failure, however it was unclear whether she had systolic or diastolic heart failure. Therefore she was referred for echocardiography.  Echo revealed normal systolic function and mild mitral regurgitation. Her RV function was slightly reduced and she had moderate tricuspid regurgitation. She also had significantly elevated pulmonary pressures with peak systolic pressure of 68 mmHg. Her IVC was dilated and there was abnormal respiratory collapse.    She notes that her blood pressure has been elevated this week.  It has ranged from 130s-150s systolic. She denies any chest pain or shortness of breath.  She also denies lower extremity edema, orthopnea or PND.    HPI 11/17/14: She is feeling well and is without complaint at this time.  She was diagnosed with atrial fibrillation approximately 3 years ago.  At that time she presented with heart failure.  She is unsure whether this was systolic or diastolic heart failure.  After starting metoprolol and diuresis she has done much better.  She denies palpitations, lightheadedness, CP, shortness of breath, LE edema, orthopnea or PND.  She moved to [place] to live with her daughter who accompanies her today.  She has been taking apixaban for anticoagulation and denies any complications.  Prior to that she was on warfarin.      Past Medical History  Diagnosis Date  . Hypertension   . A-fib (HCC)   . Spinal stenosis   . History of colon polyps   . CHF (congestive heart failure) (HCC)   . Arthritis   . Urine incontinence   . Frequent UTI   . Diverticulitis   . Hx of colonic polyps     2013 - 2 tubular adenoma sigmoid and Transverse polyp  . Internal hemorrhoids     Past Surgical History  Procedure Laterality Date  . Cataract extraction    . Gallbladder surgery  1970  . Breast biopsy  2010  . Appendectomy  1941  . Tonsillectomy and adenoidectomy  1941  . Vaginal hysterectomy       Current Outpatient Prescriptions  Medication Sig Dispense Refill  . acetaminophen (TYLENOL) 500 MG tablet Take 500 mg by mouth every 8 (eight) hours as needed.    . diphenhydrAMINE (BENADRYL) 25 MG tablet Take 25 mg by mouth every 6 (six) hours as needed.    Marland Kitchen ELIQUIS 5 MG TABS tablet Take 1 tablet (5 mg total) by mouth 2 (two) times daily. 180 tablet 1  . furosemide (LASIX) 40 MG tablet Take 1 tablet (40 mg total) by mouth daily. 90 tablet 2  . lisinopril (PRINIVIL,ZESTRIL) 10 MG tablet Take 1 tablet (10 mg total) by mouth daily. 90 tablet 1  . meloxicam (MOBIC) 15 MG tablet Take 1 tablet (15 mg total) by mouth daily. 90 tablet 1  . metoprolol succinate (TOPROL-XL) 50 MG 24  hr tablet Take 1 tablet (50 mg total) by mouth daily. 90 tablet 1  . potassium chloride SA (K-DUR,KLOR-CON) 20 MEQ tablet TAKE 1 TABLET BY MOUTH EVERY DAY 90 tablet 1   No current facility-administered medications for this visit.    Allergies:   Demerol and Vicodin    Social History:  The patient  reports that she has never smoked. She does not have any smokeless tobacco history on file. She reports that she does not drink alcohol or use illicit drugs.   Family History:  The patient's family history includes Breast cancer in an other family member; Heart failure in her father and mother; Lung cancer in an other family member; Stroke in an other  family member.    ROS:  Please see the history of present illness.  All other systems are reviewed and were positive for foot numbness and knee pain due to arthritis.  Otherwise review of systems was negative.    PHYSICAL EXAM: VS:  BP 172/82 mmHg  Pulse 68  Ht $R'5\' 2"'GQ$  (1.575 m)  Wt 81.33 kg (179 lb 4.8 oz)  BMI 32.79 kg/m2 , BMI Body mass index is 32.79 kg/(m^2). GENERAL:  Well appearing HEENT:  Pupils equal round and reactive, fundi not visualized, oral mucosa unremarkable NECK:  JVP to the mandible at 45 degrees, waveform within normal limits, carotid upstroke brisk and symmetric, no bruits, no thyromegaly LYMPHATICS:  No cervical adenopathy LUNGS:  Clear to auscultation bilaterally HEART:  Irregularly irregular.  PMI not displaced or sustained,S1 and S2 within normal limits, no S3, no S4, no clicks, no rubs, III/VI holosystolic murmur at the apex.  II/VI systolic murmur at the LUSB. ABD:  Flat, positive bowel sounds normal in frequency in pitch, no bruits, no rebound, no guarding, no midline pulsatile mass, no hepatomegaly, no splenomegaly EXT:  2 plus pulses throughout, trace edema, no cyanosis no clubbing SKIN:  No rashes no nodules NEURO:  Cranial nerves II through XII grossly intact, motor grossly intact throughout PSYCH:  Cognitively intact, oriented to person place and time    EKG:  EKG is not ordered today. The ekg ordered 11/17/14 demonstrates atrial fibrillation rate 98 bpm.  Low voltage precordial leads.     Recent Labs: 11/21/2014: ALT 21; Hemoglobin 11.7*; Magnesium 1.9; Platelets 174; TSH 1.609 12/15/2014: BUN 32*; Creatinine, Ser 1.31*; Potassium 5.2*; Sodium 140    Lipid Panel    Component Value Date/Time   CHOL 159 11/21/2014 0820   TRIG 98 11/21/2014 0820   HDL 56 11/21/2014 0820   CHOLHDL 2.8 11/21/2014 0820   VLDL 20 11/21/2014 0820   LDLCALC 83 11/21/2014 0820      Wt Readings from Last 3 Encounters:  01/07/15 81.33 kg (179 lb 4.8 oz)  12/15/14 85.957  kg (189 lb 8 oz)  12/01/14 87.816 kg (193 lb 9.6 oz)      Other studies Reviewed: Additional studies/ records that were reviewed today include:  Review of the above records demonstrates:  Please see elsewhere in the note.   ASSESSMENT AND PLAN:  # Chronic Atrial fibrillation: Rate controlled and patient tolerating metoprolol and apixaban.  Continue current management.  # Hypertension: BP above goal today.  She brings a list of her home BP readings which have been within normal range.  We will not make any changes based on this one reading, as she reports some symptomatic low BPs in the past.  Given that we will be starting diuresis, we will not titrate her antihypertensives  at this time. If her systolic blood pressures greater than 160, she will take an extra dose of metoprolol succinate 25 mg daily. This is in addition to her standing dose of 50 mg daily.   # Pulmonary hypertension: Systolic pulmonary pressure was 68 mmHg. This would be considered severe pulmonary hypertension. However she is very asymptomatic. She would prefer to avoid extensive workup. She is on enteric coagulation chronically making pulmonary embolism unlikely. She does not have any stigmata of rheumatologic disease. She denies episodes of apnea or loud snoring, making OSA less likely. Her echo was not able to evaluate for diastolic dysfunction given that she is in atrial fibrillation. I suspect that her elevated pulmonary pressures are due to left-sided heart disease. Her neck veins are elevated and her IVC was dilated on echo, so we will diuresis by increasing her Lasix to 40 mg twice a day for one week. She will follow-up in clinic in one week for volume assessment and assessment of her lateralizing creatinine.   At that time we will check a BMP, ANA, ESR and HIV.  #  CV disease prevention: Lipids at goal.  Current medicines are reviewed at length with the patient today.  The patient does not have concerns regarding  medicines.  The following changes have been made:  no change  Labs/ tests ordered today include:   No orders of the defined types were placed in this encounter.     Disposition:   FU with Dr. Jonelle Sidle C. [place] in 1 week.     Signed, Sharol Harness, MD  01/07/2015 2:34 PM    [place]

## 2015-01-07 ENCOUNTER — Encounter: Payer: Self-pay | Admitting: Cardiovascular Disease

## 2015-01-07 ENCOUNTER — Ambulatory Visit (INDEPENDENT_AMBULATORY_CARE_PROVIDER_SITE_OTHER): Payer: Medicare Other | Admitting: Cardiovascular Disease

## 2015-01-07 VITALS — BP 172/82 | HR 68 | Ht 62.0 in | Wt 179.3 lb

## 2015-01-07 DIAGNOSIS — I272 Other secondary pulmonary hypertension: Secondary | ICD-10-CM

## 2015-01-07 HISTORY — DX: Pulmonary hypertension, unspecified: I27.20

## 2015-01-07 NOTE — Patient Instructions (Signed)
Dr Duke Salviaandolph has recommended making the following medication changes: INCREASE Furosemide to 40 TWICE daily for 1 week then resume regular dose  Your physician recommends that you schedule a follow-up appointment on November 7th, 2016.  **If your systolic blood pressure (the top number of your blood pressure) is higher than 160 please take 75 mg of metoprolol (1.5 tablets)  If you need a refill on your cardiac medications before your next appointment, please call your pharmacy.

## 2015-01-19 ENCOUNTER — Encounter: Payer: Self-pay | Admitting: Cardiovascular Disease

## 2015-01-19 ENCOUNTER — Ambulatory Visit (INDEPENDENT_AMBULATORY_CARE_PROVIDER_SITE_OTHER): Payer: Medicare Other | Admitting: Cardiovascular Disease

## 2015-01-19 ENCOUNTER — Telehealth: Payer: Self-pay | Admitting: Cardiovascular Disease

## 2015-01-19 VITALS — BP 120/74 | HR 80 | Ht 66.0 in | Wt 180.3 lb

## 2015-01-19 DIAGNOSIS — I272 Other secondary pulmonary hypertension: Secondary | ICD-10-CM

## 2015-01-19 DIAGNOSIS — I10 Essential (primary) hypertension: Secondary | ICD-10-CM

## 2015-01-19 LAB — BASIC METABOLIC PANEL
BUN: 62 mg/dL — ABNORMAL HIGH (ref 7–25)
CALCIUM: 9.5 mg/dL (ref 8.6–10.4)
CHLORIDE: 102 mmol/L (ref 98–110)
CO2: 24 mmol/L (ref 20–31)
CREATININE: 1.62 mg/dL — AB (ref 0.60–0.88)
Glucose, Bld: 123 mg/dL — ABNORMAL HIGH (ref 65–99)
Potassium: 5.2 mmol/L (ref 3.5–5.3)
SODIUM: 141 mmol/L (ref 135–146)

## 2015-01-19 MED ORDER — METOPROLOL SUCCINATE ER 50 MG PO TB24: ORAL_TABLET | ORAL | Status: DC

## 2015-01-19 NOTE — Progress Notes (Signed)
Cardiology Office Note   Date:  01/19/2015   ID:  Alyssa Cameron, DOB 1918-09-12, MRN [id number]  PCP:  Shirline Frees, NP  Cardiologist:   Madilyn Hook, MD   Chief Complaint  Patient presents with  . Follow-up    no chest pain, no shortness of breath, no edema, no pain in legs, occassional cramping in legs, no lightheadedness, no dizziness     Patient ID:  Alyssa Cameron is a 81 y.o. female with hypertension, severe pulmonary hypertensin, heart failure, and chronic atrial fibrillation.    Interval History 01/19/15: At Alyssa Cameron's last appointment lasix was increased to 40 mg bid due to volume overload.  She has been feeling well.  She denies chest pain, shortness of breath, orthopnea or PND.  She thinks that her LE edema has improved significantly.  Her blood pressure has been mostly 130s-150s with one reading of 166 mmHg.  She wonders if she should increase her metoprolol.  She denies lightheadedness or dizziness.   01/05/14: Alyssa Cameron recently moved to [facility] from [place] and was seen in clinic on September 5th to establish care. At that appointment she was feeling well and was without complaint. She had previously been diagnosed with heart failure, however it was unclear whether she had systolic or diastolic heart failure. Therefore she was referred for echocardiography.  Echo revealed normal systolic function and mild mitral regurgitation. Her RV function was slightly reduced and she had moderate tricuspid regurgitation. She also had significantly elevated pulmonary pressures with peak systolic pressure of 68 mmHg. Her IVC was dilated and there was abnormal respiratory collapse.    She notes that her blood pressure has been elevated this week.  It has ranged from 130s-150s systolic. She denies any chest pain or shortness of breath.  She also denies lower extremity edema, orthopnea or PND.    HPI 11/17/14: She is feeling well and is without complaint at this time.  She was diagnosed with  atrial fibrillation approximately 3 years ago.  At that time she presented with heart failure.  She is unsure whether this was systolic or diastolic heart failure.  After starting metoprolol and diuresis she has done much better.  She denies palpitations, lightheadedness, CP, shortness of breath, LE edema, orthopnea or PND.  She moved to [place] to live with her daughter who accompanies her today.  She has been taking apixaban for anticoagulation and denies any complications.  Prior to that she was on warfarin.     Past Medical History  Diagnosis Date  . Hypertension   . A-fib (HCC)   . Spinal stenosis   . History of colon polyps   . CHF (congestive heart failure) (HCC)   . Arthritis   . Urine incontinence   . Frequent UTI   . Diverticulitis   . Hx of colonic polyps     2013 - 2 tubular adenoma sigmoid and Transverse polyp  . Internal hemorrhoids   . Pulmonary hypertension (HCC) 01/07/2015    Past Surgical History  Procedure Laterality Date  . Cataract extraction    . Gallbladder surgery  1970  . Breast biopsy  2010  . Appendectomy  1941  . Tonsillectomy and adenoidectomy  1941  . Vaginal hysterectomy       Current Outpatient Prescriptions  Medication Sig Dispense Refill  . acetaminophen (TYLENOL) 500 MG tablet Take 500 mg by mouth every 8 (eight) hours as needed.    . diphenhydrAMINE (BENADRYL) 25 MG tablet Take 25 mg  by mouth every 6 (six) hours as needed.    Alyssa Cameron ELIQUIS 5 MG TABS tablet Take 1 tablet (5 mg total) by mouth 2 (two) times daily. 180 tablet 1  . furosemide (LASIX) 40 MG tablet Take 1 tablet (40 mg total) by mouth daily. (Patient taking differently: Take 40 mg by mouth 2 (two) times daily. ) 90 tablet 2  . lisinopril (PRINIVIL,ZESTRIL) 10 MG tablet Take 1 tablet (10 mg total) by mouth daily. 90 tablet 1  . meloxicam (MOBIC) 15 MG tablet Take 1 tablet (15 mg total) by mouth daily. 90 tablet 1  . metoprolol succinate (TOPROL-XL) 50 MG 24 hr tablet Take 1 tablet  (50 mg total) by mouth daily. 90 tablet 1  . potassium chloride SA (K-DUR,KLOR-CON) 20 MEQ tablet TAKE 1 TABLET BY MOUTH EVERY DAY 90 tablet 1   No current facility-administered medications for this visit.    Allergies:   Demerol and Vicodin    Social History:  The patient  reports that she has never smoked. She does not have any smokeless tobacco history on file. She reports that she does not drink alcohol or use illicit drugs.   Family History:  The patient's family history includes Breast cancer in an other family member; Heart failure in her father and mother; Lung cancer in an other family member; Stroke in an other family member.    ROS:  Please see the history of present illness.  All other systems are reviewed and were positive for foot numbness and knee pain due to arthritis.  Otherwise review of systems was negative.    PHYSICAL EXAM: VS:  BP 120/74 mmHg  Pulse 80  Ht 5\' 6"  (1.676 m)  Wt 81.789 kg (180 lb 5 oz)  BMI 29.12 kg/m2 , BMI Body mass index is 29.12 kg/(m^2). GENERAL:  Well appearing HEENT:  Pupils equal round and reactive, fundi not visualized, oral mucosa unremarkable NECK:  JVP at clavicle at 45 degrees, waveform within normal limits, carotid upstroke brisk and symmetric, no bruits, no thyromegaly LYMPHATICS:  No cervical adenopathy LUNGS:  Clear to auscultation bilaterally HEART:  Irregularly irregular.  PMI not displaced or sustained,S1 and S2 within normal limits, no S3, no S4, no clicks, no rubs, III/VI holosystolic murmur at the apex.  II/VI systolic murmur at the LUSB. ABD:  Flat, positive bowel sounds normal in frequency in pitch, no bruits, no rebound, no guarding, no midline pulsatile mass, no hepatomegaly, no splenomegaly EXT:  2 plus pulses throughout, no edema, no cyanosis no clubbing SKIN:  No rashes no nodules NEURO:  Cranial nerves II through XII grossly intact, motor grossly intact throughout PSYCH:  Cognitively intact, oriented to person place  and time    EKG:  EKG is not ordered today. The ekg ordered 11/17/14 demonstrates atrial fibrillation rate 98 bpm.  Low voltage precordial leads.     Recent Labs: 11/21/2014: ALT 21; Hemoglobin 11.7*; Magnesium 1.9; Platelets 174; TSH 1.609 12/15/2014: BUN 32*; Creatinine, Ser 1.31*; Potassium 5.2*; Sodium 140    Lipid Panel    Component Value Date/Time   CHOL 159 11/21/2014 0820   TRIG 98 11/21/2014 0820   HDL 56 11/21/2014 0820   CHOLHDL 2.8 11/21/2014 0820   VLDL 20 11/21/2014 0820   LDLCALC 83 11/21/2014 0820      Wt Readings from Last 3 Encounters:  01/19/15 81.789 kg (180 lb 5 oz)  01/07/15 81.33 kg (179 lb 4.8 oz)  12/15/14 85.957 kg (189 lb 8 oz)  Other studies Reviewed: Additional studies/ records that were reviewed today include:  Review of the above records demonstrates:  Please see elsewhere in the note.   ASSESSMENT AND PLAN:  # Chronic Atrial fibrillation: Rate controlled and patient tolerating metoprolol and apixaban.    # Hypertension: BP improved today.  This is likely due to diuresis.  She is concerned about her SBPs in the 150s-160s.  We discussed the fact that we want her BPs to be <150 mmHg, but not too aggressive in order to avoid falls.  We will increased metoprolol to 75 mg daily and continue lisinopril 10 mg daily.  She will continue to check her BP daily and decrease metoprolol back to 50 mg if she becomes symptomatic or if SBP is <120 mmHg.  # Pulmonary hypertension: Systolic pulmonary pressure was 68 mmHg. This would be considered severe pulmonary hypertension. She is very asymptomatic. She would prefer to avoid extensive workup. She is on enteric coagulation chronically making pulmonary embolism unlikely. She does not have any stigmata of rheumatologic disease. She denies episodes of apnea or loud snoring, making OSA less likely. Her echo was not able to evaluate for diastolic dysfunction given that she is in atrial fibrillation. I suspect that  her elevated pulmonary pressures are due to left-sided heart disease. Her volume status has improved significantly with bid lasix.  We will check a BMP, ANA and ESR today.  #  CV disease prevention: Lipids at goal.  Current medicines are reviewed at length with the patient today.  The patient does not have concerns regarding medicines.  The following changes have been made:  Increase metoprolol succinate to 75 mg daily  Labs/ tests ordered today include:   No orders of the defined types were placed in this encounter.     Disposition:   FU with Dr. Jonelle Sidle C. Tye in 3 months.   Signed, Sharol Harness, MD  01/19/2015 10:07 AM    [place]

## 2015-01-19 NOTE — Telephone Encounter (Signed)
[facility] thinking she needed to have blood drawn - has no labs ordered but does have appt to see Dr. Duke Salviaandolph today.  Advised pt to come upstairs and check in - if labs needed today will order at time of appt.

## 2015-01-19 NOTE — Patient Instructions (Signed)
Increase metoprolol succinate to 75 mg daily   ( 1 and 1/2 tablets )  Labs BMP,ANA,SED RATE  Your physician recommends that you schedule a follow-up appointment in 3 months with Dr Duke Salviaandolph

## 2015-01-20 LAB — ANA: Anti Nuclear Antibody(ANA): NEGATIVE

## 2015-01-20 LAB — SEDIMENTATION RATE: SED RATE: 8 mm/h (ref 0–30)

## 2015-01-21 ENCOUNTER — Other Ambulatory Visit: Payer: Self-pay

## 2015-01-21 ENCOUNTER — Telehealth: Payer: Self-pay | Admitting: *Deleted

## 2015-01-21 NOTE — Telephone Encounter (Signed)
-----   Message from Chilton Siiffany [place], MD sent at 01/20/2015  9:16 PM EST ----- Kidney function is increasing.  Don't take any lasix for one day.  Then, start back taking 40 mg daily.  Weigh yourself every day and if weight goes up by more than 2 lb in one day or 5 lb in one week, take an extra dose of lasix.

## 2015-01-21 NOTE — Telephone Encounter (Signed)
Left message to call back on 01/21/15

## 2015-01-22 MED ORDER — FUROSEMIDE 40 MG PO TABS: 40.0000 mg | ORAL_TABLET | Freq: Every day | ORAL | Status: DC

## 2015-01-22 NOTE — Telephone Encounter (Signed)
Left message to call back  

## 2015-01-22 NOTE — Telephone Encounter (Signed)
Pt is returning your call from yesterday about some lab results

## 2015-01-22 NOTE — Telephone Encounter (Signed)
Spoke to patient. Result given . Verbalized understanding AWARE TO HOLD LASIX THEN TAKE ONE TABLET DAILY

## 2015-01-23 ENCOUNTER — Other Ambulatory Visit: Payer: Self-pay | Admitting: *Deleted

## 2015-01-23 DIAGNOSIS — M1990 Unspecified osteoarthritis, unspecified site: Secondary | ICD-10-CM

## 2015-01-23 MED ORDER — MELOXICAM 15 MG PO TABS: 15.0000 mg | ORAL_TABLET | Freq: Every day | ORAL | Status: DC

## 2015-01-30 ENCOUNTER — Telehealth: Payer: Self-pay | Admitting: Adult Health

## 2015-01-30 DIAGNOSIS — M1990 Unspecified osteoarthritis, unspecified site: Secondary | ICD-10-CM

## 2015-01-30 DIAGNOSIS — I10 Essential (primary) hypertension: Secondary | ICD-10-CM

## 2015-01-30 MED ORDER — MELOXICAM 15 MG PO TABS: 15.0000 mg | ORAL_TABLET | Freq: Every day | ORAL | Status: DC

## 2015-01-30 MED ORDER — LISINOPRIL 10 MG PO TABS: 10.0000 mg | ORAL_TABLET | Freq: Every day | ORAL | Status: DC

## 2015-01-30 NOTE — Telephone Encounter (Signed)
Patient requesting Rx refill on meloxicam (MOBIC) 15 MG tablet lisinopril (PRINIVIL,ZESTRIL) 10 MG tabletit was sent mail order but will not be filled up Dec 10 th. Patient asking for 30 days  [facility] [address] [facility]

## 2015-01-30 NOTE — Telephone Encounter (Signed)
Rx sent to pharmacy   

## 2015-02-12 ENCOUNTER — Ambulatory Visit (INDEPENDENT_AMBULATORY_CARE_PROVIDER_SITE_OTHER): Payer: Medicare Other | Admitting: Adult Health

## 2015-02-12 ENCOUNTER — Encounter: Payer: Self-pay | Admitting: Adult Health

## 2015-02-12 VITALS — BP 150/80 | Temp 98.7°F | Ht 66.0 in | Wt 185.8 lb

## 2015-02-12 DIAGNOSIS — K219 Gastro-esophageal reflux disease without esophagitis: Secondary | ICD-10-CM | POA: Diagnosis not present

## 2015-02-12 MED ORDER — NYAMYC 100000 UNIT/GM EX POWD: CUTANEOUS | Status: DC

## 2015-02-12 MED ORDER — PANTOPRAZOLE SODIUM 40 MG PO TBEC: 40.0000 mg | DELAYED_RELEASE_TABLET | Freq: Every day | ORAL | Status: DC

## 2015-02-12 NOTE — Progress Notes (Signed)
Subjective:    Patient ID: Alyssa BranchRita Cameron, female    DOB: [birth year], 81 y.o.   MRN: [id number]  HPI  81 year old female who presents to the office for generalized abdominal pain 1 month. Per patient, she has noticed that she has increased pain before meals and when her stomach is full the pain is no longer there. She also endorses having more gas and belching more often, and belching often reduces the discomfort. Pain is described as "dull". She has not tried any over-the-counter medications. She also has no history of GERD.   Sh denies any vomiting, diarrhea, blood in stool, or waking up with a sour taste in her mouth,  but she does endorse occasional nausea.    Review of Systems  Constitutional: Negative.   HENT: Negative.   Eyes: Negative.   Respiratory: Negative.   Cardiovascular: Negative.   Gastrointestinal: Positive for nausea and abdominal pain. Negative for vomiting, diarrhea, constipation, blood in stool, abdominal distention, anal bleeding and rectal pain.  All other systems reviewed and are negative.  Past Medical History  Diagnosis Date  . Hypertension   . A-fib (HCC)   . Spinal stenosis   . History of colon polyps   . CHF (congestive heart failure) (HCC)   . Arthritis   . Urine incontinence   . Frequent UTI   . Diverticulitis   . Hx of colonic polyps     2013 - 2 tubular adenoma sigmoid and Transverse polyp  . Internal hemorrhoids   . Pulmonary hypertension (HCC) 01/07/2015    Social History   Social History  . Marital Status: Widowed    Spouse Name: N/A  . Number of Children: N/A  . Years of Education: N/A   Occupational History  . Not on file.   Social History Main Topics  . Smoking status: Never Smoker   . Smokeless tobacco: Not on file  . Alcohol Use: No  . Drug Use: No  . Sexual Activity: Not on file   Other Topics Concern  . Not on file   Social History Narrative   Retired from [employer]    Lived in [place] and moved here from [place]   2 daughter  and 2 sons   Likes to read and do puzzles    Past Surgical History  Procedure Laterality Date  . Cataract extraction    . Gallbladder surgery  1970  . Breast biopsy  2010  . Appendectomy  1941  . Tonsillectomy and adenoidectomy  1941  . Vaginal hysterectomy      Family History  Problem Relation Age of Onset  . Heart failure Mother     [id number]  . Heart failure Father     [id number]  . Breast cancer    . Lung cancer    . Stroke      Allergies  Allergen Reactions  . Demerol [Meperidine] Nausea Only  . Vicodin [Hydrocodone-Acetaminophen] Nausea And Vomiting    Current Outpatient Prescriptions on File Prior to Visit  Medication Sig Dispense Refill  . acetaminophen (TYLENOL) 500 MG tablet Take 500 mg by mouth every 8 (eight) hours as needed.    . diphenhydrAMINE (BENADRYL) 25 MG tablet Take 25 mg by mouth every 6 (six) hours as needed.    Marland Kitchen. ELIQUIS 5 MG TABS tablet Take 1 tablet (5 mg total) by mouth 2 (two) times daily. 180 tablet 1  . furosemide (LASIX) 40 MG tablet Take 1 tablet (40 mg total) by mouth daily. 90  tablet 3  . lisinopril (PRINIVIL,ZESTRIL) 10 MG tablet Take 1 tablet (10 mg total) by mouth daily. 30 tablet 0  . meloxicam (MOBIC) 15 MG tablet Take 1 tablet (15 mg total) by mouth daily. 30 tablet 0  . metoprolol succinate (TOPROL-XL) 50 MG 24 hr tablet Take 1 and 1/2 tablet daily. 135 tablet 3  . potassium chloride SA (K-DUR,KLOR-CON) 20 MEQ tablet TAKE 1 TABLET BY MOUTH EVERY DAY 90 tablet 1   No current facility-administered medications on file prior to visit.    BP 150/80 mmHg  Temp(Src) 98.7 F (37.1 C) (Oral)  Ht  (1.676 m)  Wt 185 lb 12.8 oz (84.278 kg)  BMI 30.00 kg/m2       Objective:   Physical Exam  Constitutional: She is oriented to person, place, and time. She appears well-developed and well-nourished. No distress.  Cardiovascular: Normal rate, regular rhythm, normal heart sounds and intact distal pulses.  Exam reveals no gallop and no friction  rub.   No murmur heard. Pulmonary/Chest: Effort normal and breath sounds normal. No respiratory distress. She has no wheezes. She has no rales. She exhibits no tenderness.  Abdominal: Soft. Bowel sounds are normal. She exhibits no distension and no mass. There is no tenderness. There is no rebound and no guarding.  Neurological: She is alert and oriented to person, place, and time.  Skin: Skin is warm and dry. No rash noted. She is not diaphoretic. No erythema. No pallor.  Psychiatric: She has a normal mood and affect. Her behavior is normal. Judgment and thought content normal.  Nursing note and vitals reviewed.      Assessment & Plan:  1. Gastroesophageal reflux disease without esophagitis -Possibly gastric ulcer. She does not want a extensive workup done - pantoprazole (PROTONIX) 40 MG tablet; Take 1 tablet (40 mg total) by mouth daily.  Dispense: 30 tablet; Refill: 3 - Follow-up if no improvement in 2 or 3 days

## 2015-02-12 NOTE — Patient Instructions (Addendum)
It was so nice seeing you today!  I think you have some acid reflux. I have sent in a prescription for a medication called Protonix. Take this every day. If you continue to have discomfort please let me know.   Have a great Holiday Season!    Gastroesophageal Reflux Disease, Adult Normally, food travels down the esophagus and stays in the stomach to be digested. However, when a person has gastroesophageal reflux disease (GERD), food and stomach acid move back up into the esophagus. When this happens, the esophagus becomes sore and inflamed. Over time, GERD can create small holes (ulcers) in the lining of the esophagus.  CAUSES This condition is caused by a problem with the muscle between the esophagus and the stomach (lower esophageal sphincter, or LES). Normally, the LES muscle closes after food passes through the esophagus to the stomach. When the LES is weakened or abnormal, it does not close properly, and that allows food and stomach acid to go back up into the esophagus. The LES can be weakened by certain dietary substances, medicines, and medical conditions, including:  Tobacco use.  Pregnancy.  Having a hiatal hernia.  Heavy alcohol use.  Certain foods and beverages, such as coffee, chocolate, onions, and peppermint. RISK FACTORS This condition is more likely to develop in:  People who have an increased body weight.  People who have connective tissue disorders.  People who use NSAID medicines. SYMPTOMS Symptoms of this condition include:  Heartburn.  Difficult or painful swallowing.  The feeling of having a lump in the throat.  Abitter taste in the mouth.  Bad breath.  Having a large amount of saliva.  Having an upset or bloated stomach.  Belching.  Chest pain.  Shortness of breath or wheezing.  Ongoing (chronic) cough or a night-time cough.  Wearing away of tooth enamel.  Weight loss. Different conditions can cause chest pain. Make sure to see your  health care provider if you experience chest pain. DIAGNOSIS Your health care provider will take a medical history and perform a physical exam. To determine if you have mild or severe GERD, your health care provider may also monitor how you respond to treatment. You may also have other tests, including:  An endoscopy toexamine your stomach and esophagus with a small camera.  A test thatmeasures the acidity level in your esophagus.  A test thatmeasures how much pressure is on your esophagus.  A barium swallow or modified barium swallow to show the shape, size, and functioning of your esophagus. TREATMENT The goal of treatment is to help relieve your symptoms and to prevent complications. Treatment for this condition may vary depending on how severe your symptoms are. Your health care provider may recommend:  Changes to your diet.  Medicine.  Surgery. HOME CARE INSTRUCTIONS Diet  Follow a diet as recommended by your health care provider. This may involve avoiding foods and drinks such as:  Coffee and tea (with or without caffeine).  Drinks that containalcohol.  Energy drinks and sports drinks.  Carbonated drinks or sodas.  Chocolate and cocoa.  Peppermint and mint flavorings.  Garlic and onions.  Horseradish.  Spicy and acidic foods, including peppers, chili powder, curry powder, vinegar, hot sauces, and barbecue sauce.  Citrus fruit juices and citrus fruits, such as oranges, lemons, and limes.  Tomato-based foods, such as red sauce, chili, salsa, and pizza with red sauce.  Fried and fatty foods, such as donuts, french fries, potato chips, and high-fat dressings.  High-fat meats, such  as hot dogs and fatty cuts of red and white meats, such as rib eye steak, sausage, ham, and bacon.  High-fat dairy items, such as whole milk, butter, and cream cheese.  Eat small, frequent meals instead of large meals.  Avoid drinking large amounts of liquid with your  meals.  Avoid eating meals during the 2-3 hours before bedtime.  Avoid lying down right after you eat.  Do not exercise right after you eat. General Instructions  Pay attention to any changes in your symptoms.  Take over-the-counter and prescription medicines only as told by your health care provider. Do not take aspirin, ibuprofen, or other NSAIDs unless your health care provider told you to do so.  Do not use any tobacco products, including cigarettes, chewing tobacco, and e-cigarettes. If you need help quitting, ask your health care provider.  Wear loose-fitting clothing. Do not wear anything tight around your waist that causes pressure on your abdomen.  Raise (elevate) the head of your bed 6 inches (15cm).  Try to reduce your stress, such as with yoga or meditation. If you need help reducing stress, ask your health care provider.  If you are overweight, reduce your weight to an amount that is healthy for you. Ask your health care provider for guidance about a safe weight loss goal.  Keep all follow-up visits as told by your health care provider. This is important. SEEK MEDICAL CARE IF:  You have new symptoms.  You have unexplained weight loss.  You have difficulty swallowing, or it hurts to swallow.  You have wheezing or a persistent cough.  Your symptoms do not improve with treatment.  You have a hoarse voice. SEEK IMMEDIATE MEDICAL CARE IF:  You have pain in your arms, neck, jaw, teeth, or back.  You feel sweaty, dizzy, or light-headed.  You have chest pain or shortness of breath.  You vomit and your vomit looks like blood or coffee grounds.  You faint.  Your stool is bloody or black.  You cannot swallow, drink, or eat.   This information is not intended to replace advice given to you by your health care provider. Make sure you discuss any questions you have with your health care provider.   Document Released: 12/08/2004 Document Revised: 11/19/2014  Document Reviewed: 06/25/2014 Elsevier Interactive Patient Education [employer].

## 2015-02-12 NOTE — Progress Notes (Signed)
Pre visit review using our clinic review tool, if applicable. No additional management support is needed unless otherwise documented below in the visit note. 

## 2015-02-23 ENCOUNTER — Telehealth: Payer: Self-pay | Admitting: Adult Health

## 2015-02-23 ENCOUNTER — Other Ambulatory Visit: Payer: Self-pay | Admitting: Adult Health

## 2015-02-23 DIAGNOSIS — M1990 Unspecified osteoarthritis, unspecified site: Secondary | ICD-10-CM

## 2015-02-23 MED ORDER — MELOXICAM 15 MG PO TABS: 15.0000 mg | ORAL_TABLET | Freq: Every day | ORAL | Status: DC

## 2015-02-23 NOTE — Telephone Encounter (Signed)
Please only take one 15 mg tab or split in half and take one half in the morning and one half at night

## 2015-02-23 NOTE — Telephone Encounter (Signed)
Per last visit pt should decrease to 1 tablet daily due to upset stomach.  Would you like pt to take 1 tab daily?

## 2015-02-23 NOTE — Telephone Encounter (Signed)
Pt request refill of the following: meloxicam (MOBIC) 15 MG tablet  Daughter said Mom take med twice a day so the pharmacy need a rx saying that  [facility] [address]

## 2015-02-23 NOTE — Telephone Encounter (Signed)
Called and spoke with Aram BeechamCynthia and she is aware of recommendations.  She states pt told her that earlier today and she wanted to make sure.  Rx sent to pharmacy.

## 2015-03-09 ENCOUNTER — Encounter (HOSPITAL_COMMUNITY): Payer: Self-pay | Admitting: Emergency Medicine

## 2015-03-09 ENCOUNTER — Emergency Department (HOSPITAL_COMMUNITY)
Admission: EM | Admit: 2015-03-09 | Discharge: 2015-03-09 | Disposition: A | Discharge status: Home / Self Care | Payer: Medicare Other | Attending: Emergency Medicine | Admitting: Emergency Medicine

## 2015-03-09 DIAGNOSIS — M1990 Unspecified osteoarthritis, unspecified site: Secondary | ICD-10-CM | POA: Insufficient documentation

## 2015-03-09 DIAGNOSIS — Z86010 Personal history of colonic polyps: Secondary | ICD-10-CM | POA: Insufficient documentation

## 2015-03-09 DIAGNOSIS — I4891 Unspecified atrial fibrillation: Secondary | ICD-10-CM | POA: Diagnosis not present

## 2015-03-09 DIAGNOSIS — I509 Heart failure, unspecified: Secondary | ICD-10-CM | POA: Insufficient documentation

## 2015-03-09 DIAGNOSIS — N390 Urinary tract infection, site not specified: Secondary | ICD-10-CM | POA: Diagnosis not present

## 2015-03-09 DIAGNOSIS — Z791 Long term (current) use of non-steroidal anti-inflammatories (NSAID): Secondary | ICD-10-CM | POA: Insufficient documentation

## 2015-03-09 DIAGNOSIS — R319 Hematuria, unspecified: Secondary | ICD-10-CM | POA: Diagnosis present

## 2015-03-09 DIAGNOSIS — I10 Essential (primary) hypertension: Secondary | ICD-10-CM | POA: Diagnosis not present

## 2015-03-09 DIAGNOSIS — Z7902 Long term (current) use of antithrombotics/antiplatelets: Secondary | ICD-10-CM | POA: Insufficient documentation

## 2015-03-09 DIAGNOSIS — Z8719 Personal history of other diseases of the digestive system: Secondary | ICD-10-CM | POA: Insufficient documentation

## 2015-03-09 DIAGNOSIS — Z79899 Other long term (current) drug therapy: Secondary | ICD-10-CM | POA: Insufficient documentation

## 2015-03-09 LAB — CBC WITH DIFFERENTIAL/PLATELET
BASOS PCT: 1 %
Basophils Absolute: 0 10*3/uL (ref 0.0–0.1)
EOS ABS: 0.2 10*3/uL (ref 0.0–0.7)
EOS PCT: 3 %
HCT: 36.6 % (ref 36.0–46.0)
HEMOGLOBIN: 11.8 g/dL — AB (ref 12.0–15.0)
Lymphocytes Relative: 12 %
Lymphs Abs: 0.8 10*3/uL (ref 0.7–4.0)
MCH: 31.1 pg (ref 26.0–34.0)
MCHC: 32.2 g/dL (ref 30.0–36.0)
MCV: 96.6 fL (ref 78.0–100.0)
MONO ABS: 0.4 10*3/uL (ref 0.1–1.0)
MONOS PCT: 7 %
NEUTROS PCT: 77 %
Neutro Abs: 5.1 10*3/uL (ref 1.7–7.7)
PLATELETS: 190 10*3/uL (ref 150–400)
RBC: 3.79 MIL/uL — ABNORMAL LOW (ref 3.87–5.11)
RDW: 14.6 % (ref 11.5–15.5)
WBC: 6.6 10*3/uL (ref 4.0–10.5)

## 2015-03-09 LAB — URINALYSIS, ROUTINE W REFLEX MICROSCOPIC
BILIRUBIN URINE: NEGATIVE
Glucose, UA: NEGATIVE mg/dL
KETONES UR: NEGATIVE mg/dL
NITRITE: NEGATIVE
PH: 5.5 (ref 5.0–8.0)
Protein, ur: 30 mg/dL — AB
Specific Gravity, Urine: 1.014 (ref 1.005–1.030)

## 2015-03-09 LAB — URINE MICROSCOPIC-ADD ON

## 2015-03-09 LAB — BASIC METABOLIC PANEL
Anion gap: 10 (ref 5–15)
BUN: 39 mg/dL — ABNORMAL HIGH (ref 6–20)
CALCIUM: 10 mg/dL (ref 8.9–10.3)
CO2: 27 mmol/L (ref 22–32)
CREATININE: 1.4 mg/dL — AB (ref 0.44–1.00)
Chloride: 103 mmol/L (ref 101–111)
GFR, EST AFRICAN AMERICAN: 36 mL/min — AB (ref >60)
GFR, EST NON AFRICAN AMERICAN: 31 mL/min — AB (ref >60)
Glucose, Bld: 212 mg/dL — ABNORMAL HIGH (ref 65–99)
Potassium: 4.4 mmol/L (ref 3.5–5.1)
SODIUM: 140 mmol/L (ref 135–145)

## 2015-03-09 MED ORDER — CEPHALEXIN 500 MG PO CAPS
500.0000 mg | ORAL_CAPSULE | Freq: Once | ORAL | Status: AC
  Administered 2015-03-09: 500 mg via ORAL
  Filled 2015-03-09: qty 1

## 2015-03-09 MED ORDER — CEPHALEXIN 500 MG PO CAPS: 500.0000 mg | ORAL_CAPSULE | Freq: Four times a day (QID) | ORAL | Status: DC

## 2015-03-09 NOTE — ED Provider Notes (Signed)
CSN: [id number]     Arrival date & time 03/09/15  [id number] History   First MD Initiated Contact with Patient 03/09/15 0818     Chief Complaint  Patient presents with  . Hematuria     (Consider location/radiation/quality/duration/timing/severity/associated sxs/prior Treatment) Patient is a 81 y.o. female presenting with hematuria. The history is provided by the patient (The patient states that she has been having blood in her urine per few days.).  Hematuria This is a new problem. The current episode started more than 2 days ago. The problem occurs constantly. The problem has not changed since onset.Pertinent negatives include no chest pain, no abdominal pain and no headaches. Nothing aggravates the symptoms. Nothing relieves the symptoms.    Past Medical History  Diagnosis Date  . Hypertension   . A-fib (HCC)   . Spinal stenosis   . History of colon polyps   . CHF (congestive heart failure) (HCC)   . Arthritis   . Urine incontinence   . Frequent UTI   . Diverticulitis   . Hx of colonic polyps     2013 - 2 tubular adenoma sigmoid and Transverse polyp  . Internal hemorrhoids   . Pulmonary hypertension (HCC) 01/07/2015   Past Surgical History  Procedure Laterality Date  . Cataract extraction    . Gallbladder surgery  1970  . Breast biopsy  2010  . Appendectomy  1941  . Tonsillectomy and adenoidectomy  1941  . Vaginal hysterectomy     Family History  Problem Relation Age of Onset  . Heart failure Mother     8364  . Heart failure Father     4175  . Breast cancer    . Lung cancer    . Stroke     Social History  Substance Use Topics  . Smoking status: Never Smoker   . Smokeless tobacco: None  . Alcohol Use: No   OB History    No data available     Review of Systems  Constitutional: Negative for appetite change and fatigue.  HENT: Negative for congestion, ear discharge and sinus pressure.   Eyes: Negative for discharge.  Respiratory: Negative for cough.    Cardiovascular: Negative for chest pain.  Gastrointestinal: Negative for abdominal pain and diarrhea.  Genitourinary: Positive for hematuria. Negative for frequency.  Musculoskeletal: Negative for back pain.  Skin: Negative for rash.  Neurological: Negative for seizures and headaches.  Psychiatric/Behavioral: Negative for hallucinations.      Allergies  Demerol and Vicodin  Home Medications   Prior to Admission medications   Medication Sig Start Date End Date Taking? Authorizing Provider  acetaminophen (TYLENOL) 500 MG tablet Take 500-1,000 mg by mouth every 8 (eight) hours as needed for moderate pain.    Yes Historical Provider, MD  CRANBERRY PO Take 1 tablet by mouth daily.   Yes Historical Provider, MD  diphenhydrAMINE (BENADRYL) 25 MG tablet Take 25 mg by mouth at bedtime as needed for sleep.    Yes Historical Provider, MD  ELIQUIS 5 MG TABS tablet Take 1 tablet (5 mg total) by mouth 2 (two) times daily. 10/20/14  Yes Shirline Freesory Nafziger, NP  furosemide (LASIX) 40 MG tablet Take 1 tablet (40 mg total) by mouth daily. 01/22/15  Yes Chilton Siiffany Niagara, MD  lisinopril (PRINIVIL,ZESTRIL) 10 MG tablet Take 1 tablet (10 mg total) by mouth daily. Patient taking differently: Take 15 mg by mouth daily.  01/30/15  Yes Shirline Freesory Nafziger, NP  meloxicam (MOBIC) 15 MG tablet Take 1 tablet (15  mg total) by mouth daily. 02/23/15  Yes Shirline Frees, NP  metoprolol succinate (TOPROL-XL) 50 MG 24 hr tablet Take 1 and 1/2 tablet daily. Patient taking differently: Take 75 mg by mouth daily.  01/19/15  Yes Chilton Si, MD  Nystatin [facility]) 100000 UNIT/GM POWD Apply as needed Patient taking differently: Apply 1 g topically daily as needed (rash). Apply as neede 02/12/15  Yes Shirline Frees, NP  pantoprazole (PROTONIX) 40 MG tablet Take 1 tablet (40 mg total) by mouth daily. 02/12/15  Yes Shirline Frees, NP  potassium chloride SA (K-DUR,KLOR-CON) 20 MEQ tablet TAKE 1 TABLET BY MOUTH EVERY DAY 11/26/14  Yes Shirline Frees, NP  cephALEXin (KEFLEX) 500 MG capsule Take 1 capsule (500 mg total) by mouth 4 (four) times daily. 03/09/15   Bethann Berkshire, MD   BP 151/74 mmHg  Pulse 85  Temp(Src) 97.6 F (36.4 C) (Oral)  Resp 20  SpO2 100% Physical Exam  Constitutional: She is oriented to person, place, and time. She appears well-developed.  HENT:  Head: Normocephalic.  Eyes: Conjunctivae and EOM are normal. No scleral icterus.  Neck: Neck supple. No thyromegaly present.  Cardiovascular: Normal rate and regular rhythm.  Exam reveals no gallop and no friction rub.   No murmur heard. Pulmonary/Chest: No stridor. She has no wheezes. She has no rales. She exhibits no tenderness.  Abdominal: She exhibits no distension. There is no tenderness. There is no rebound.  Musculoskeletal: Normal range of motion. She exhibits no edema.  Lymphadenopathy:    She has no cervical adenopathy.  Neurological: She is oriented to person, place, and time. She exhibits normal muscle tone. Coordination normal.  Skin: No rash noted. No erythema.  Psychiatric: She has a normal mood and affect. Her behavior is normal.    ED Course  Procedures (including critical care time) Labs Review Labs Reviewed  URINALYSIS, ROUTINE W REFLEX MICROSCOPIC (NOT AT [facility]) - Abnormal; Notable for the following:    Color, Urine RED (*)    APPearance TURBID (*)    Hgb urine dipstick LARGE (*)    Protein, ur 30 (*)    Leukocytes, UA MODERATE (*)    All other components within normal limits  CBC WITH DIFFERENTIAL/PLATELET - Abnormal; Notable for the following:    RBC 3.79 (*)    Hemoglobin 11.8 (*)    All other components within normal limits  BASIC METABOLIC PANEL - Abnormal; Notable for the following:    Glucose, Bld 212 (*)    BUN 39 (*)    Creatinine, Ser 1.40 (*)    GFR calc non Af Amer 31 (*)    GFR calc Af Amer 36 (*)    All other components within normal limits  URINE MICROSCOPIC-ADD ON - Abnormal; Notable for the following:     Squamous Epithelial / LPF 0-5 (*)    Bacteria, UA FEW (*)    All other components within normal limits  URINE CULTURE    Imaging Review No results found. I have personally reviewed and evaluated these images and lab results as part of my medical decision-making.   EKG Interpretation None      MDM   Final diagnoses:  UTI (lower urinary tract infection)    Urinary tract infection. Patient will be treated with Keflex. She will be told to hold her Eliquis tonight. She will follow-up with her PCP or urologist in 3-4 days    Bethann Berkshire, MD 03/09/15 1047

## 2015-03-09 NOTE — ED Notes (Signed)
Bright red blood in urine which started on Friday.  She primary care on Friday and they recommended coming to ED, but then the bleeding stopped.  Then last night started again with clots and bleeding again.  Patient has been diagnosed with chronic cystitis.  Patient takes eliquis for atrial fib.

## 2015-03-09 NOTE — Discharge Instructions (Signed)
Follow up with your urologist or family md in 3-4 days

## 2015-03-10 ENCOUNTER — Telehealth: Payer: Self-pay

## 2015-03-10 LAB — URINE CULTURE

## 2015-03-10 NOTE — Telephone Encounter (Signed)
PLEASE NOTE: All timestamps contained within this report are represented as [place] Standard Time. CONFIDENTIALTY NOTICE: This fax transmission is intended only for the addressee. It contains information that is legally privileged, confidential or otherwise protected from use or disclosure. If you are not the intended recipient, you are strictly prohibited from reviewing, disclosing, copying using or disseminating any of this information or taking any action in reliance on or regarding this information. If you have received this fax in error, please notify us immediately by telephone so that we can arrange for its return to [place]. Phone: [phone], Toll-Free: [phone], Fax: [phone] Page: 1 of 2 Call Id: [id number] [facility] - Client TELEPHONE ADVICE RECORD [facility] Patient Name: Alyssa Cameron Gender: Female DOB: 11-01-1918 Age: 81 Y 1 M 6 D Return Phone Number: [phone] (Primary), [phone] (Secondary) [place] [address] Client [facility] - Client Client Site [facility] - Night Physician Evelena Peat Contact Type Call Call Type Triage / Clinical Relationship To Patient Self Return Phone Number [phone] (Primary) Chief Complaint Vaginal Bleeding Initial Comment Caller states she has bleeding from bladder. Dr. Debarah Crape not listed, but works under Dr. Caryl Never. PreDisposition Call Doctor Nurse Assessment Nurse: Delford Field, RN, Ivin Booty Date/Time Lamount Cohen Time): 03/08/2015 10:03:22 AM Confirm and document reason for call. If symptomatic, describe symptoms. ---Caller states that she has chronic cystitis and has been spotting bright blood since yesterday that has gone down, and she spots periodically with brown blood but this was bright the last two days, and she was told it was related to the elequis. Caller states that it is spotting, but bright  spotting, but it has decreased. Has the patient traveled out of the country within the last 30 days? ---No Does the patient have any new or worsening symptoms? ---Yes Will a triage be completed? ---Yes Related visit to physician within the last 2 weeks? ---No Does the PT have any chronic conditions? (i.e. diabetes, asthma, etc.) ---Yes List chronic conditions. ---cystitis, afib, arthritis Is this a behavioral health or substance abuse call? ---No Guidelines Guideline Title Affirmed Question Affirmed Notes Nurse Date/Time (Eastern Time) Urine - Blood In Taking Coumadin (warfarin) or other strong blood thinner, or known bleeding disorder (e.g., thrombocytopenia) Delford Field, RN, Ivin Booty 03/08/2015 10:06:45 AM Disp. Time Lamount Cohen Time) Disposition Final User 03/08/2015 10:10:52 AM See Physician within 4 Hours (or PCP triage) Yes Delford Field, RN, Ivin Booty PLEASE NOTE: All timestamps contained within this report are represented as [place] Standard Time. CONFIDENTIALTY NOTICE: This fax transmission is intended only for the addressee. It contains information that is legally privileged, confidential or otherwise protected from use or disclosure. If you are not the intended recipient, you are strictly prohibited from reviewing, disclosing, copying using or disseminating any of this information or taking any action in reliance on or regarding this information. If you have received this fax in error, please notify us immediately by telephone so that we can arrange for its return to [place]. Phone: [phone], Toll-Free: [phone], Fax: [phone] Page: 2 of 2 Call Id: [id number] Caller Understands: Yes Disagree/Comply: Comply Care Advice Given Per Guideline SEE PHYSICIAN WITHIN 4 HOURS (or PCP triage): * IF OFFICE WILL BE CLOSED AND NO PCP TRIAGE: You need to be seen within the next 3 or 4 hours. A nearby Urgent Care Center is often a good source of care. Another choice is to go to the ER. Go sooner if  you become worse. BRING MEDICINES: * Please bring  a list of your current medicines when you go to see the doctor. * It is also a good idea to bring the pill bottles too. This will help the doctor to make certain you are taking the right medicines and the right dose. CALL BACK IF: * Fever occurs * You become worse. CARE ADVICE given per Urine, Blood In (Adult) guideline. After Care Instructions Given Call Event Type User Date / Time Description [facility]

## 2015-03-10 NOTE — Telephone Encounter (Signed)
Left message to call back  

## 2015-03-11 ENCOUNTER — Telehealth: Payer: Self-pay | Admitting: Adult Health

## 2015-03-11 ENCOUNTER — Telehealth (HOSPITAL_COMMUNITY): Payer: Self-pay

## 2015-03-11 ENCOUNTER — Other Ambulatory Visit: Payer: Self-pay | Admitting: *Deleted

## 2015-03-11 DIAGNOSIS — K219 Gastro-esophageal reflux disease without esophagitis: Secondary | ICD-10-CM

## 2015-03-11 MED ORDER — PANTOPRAZOLE SODIUM 40 MG PO TBEC: 40.0000 mg | DELAYED_RELEASE_TABLET | Freq: Every day | ORAL | Status: DC

## 2015-03-11 NOTE — Telephone Encounter (Signed)
Noted  

## 2015-03-11 NOTE — Telephone Encounter (Signed)
Rx done. 

## 2015-03-11 NOTE — Telephone Encounter (Signed)
Spoke with daughter. Pt went to the ER on 03/09/15. She was DX with UTI and is currently being treated. Daughter will call back later today to schedule [facility] when she finds out when the last dose of medication will be. Daughter is aware that Kandee KeenCory will not be back in the office until 1/9 and is agreeable to schedule with another provider.

## 2015-03-11 NOTE — Telephone Encounter (Signed)
Patient returned your call back

## 2015-03-11 NOTE — Telephone Encounter (Signed)
Post ED Visit - Positive Culture Follow-up  Culture report reviewed by antimicrobial stewardship pharmacist:  []  Enzo BiNathan Batchelder, Pharm.D. []  Celedonio MiyamotoJeremy Frens, Pharm.D., BCPS [x]  Garvin FilaMike Maccia, Pharm.D. []  Georgina PillionElizabeth Martin, Pharm.D., BCPS []  White SalmonMinh Pham, [address]Ph[address]D., BCPS, AAHIVP []  Estella HuskMichelle Turner, Pharm.D., BCPS, AAHIVP []  Tennis Mustassie Stewart, [address]Pharm[address] []  Sherle Poeob Vincent, [address]  Positive urine culture, 80,000 colonies -> Group B strep Treated with cephalexin, organism sensitive to the same and no further patient follow-up is required at this time.  Arvid RightClark, Tylor Gambrill Dorn 03/11/2015, 9:22 AM

## 2015-03-11 NOTE — Telephone Encounter (Signed)
Spoke with daughter. Pt went to the

## 2015-03-11 NOTE — Telephone Encounter (Signed)
See other message

## 2015-03-17 ENCOUNTER — Ambulatory Visit (INDEPENDENT_AMBULATORY_CARE_PROVIDER_SITE_OTHER): Payer: Medicare Other | Admitting: Family Medicine

## 2015-03-17 ENCOUNTER — Encounter: Payer: Self-pay | Admitting: Family Medicine

## 2015-03-17 VITALS — BP 138/70 | HR 74 | Temp 98.1°F | Ht 66.0 in | Wt 185.1 lb

## 2015-03-17 DIAGNOSIS — N3001 Acute cystitis with hematuria: Secondary | ICD-10-CM | POA: Diagnosis not present

## 2015-03-17 NOTE — Progress Notes (Signed)
Pre visit review using our clinic review tool, if applicable. No additional management support is needed unless otherwise documented below in the visit note. 

## 2015-03-17 NOTE — Progress Notes (Signed)
HPI:  Alyssa Cameron is a pleasant 82 yo patient of Alyssa Cameron here for follow up of a UTI. She was seen and treated in the ER  Last week for a UTI. Reports has hx recurrent UTIs and hematuria and sees urologist for this. Reports since taking abx urine has cleared and she has no symptoms other then has had very mild diarrhea from the abx, < 3 stools per day and resolving now. Denies:fevers, chills, malaise, persistent or severe diarrhea, hematuria, flank pain, dysuria, NV.  ROS: See pertinent positives and negatives per HPI.  Past Medical History  Diagnosis Date  . Hypertension   . A-fib (HCC)   . Spinal stenosis   . History of colon polyps   . CHF (congestive heart failure) (HCC)   . Arthritis   . Urine incontinence   . Frequent UTI   . Diverticulitis   . Hx of colonic polyps     2013 - 2 tubular adenoma sigmoid and Transverse polyp  . Internal hemorrhoids   . Pulmonary hypertension (HCC) 01/07/2015    Past Surgical History  Procedure Laterality Date  . Cataract extraction    . Gallbladder surgery  1970  . Breast biopsy  2010  . Appendectomy  1941  . Tonsillectomy and adenoidectomy  1941  . Vaginal hysterectomy      Family History  Problem Relation Age of Onset  . Heart failure Mother     4764  . Heart failure Father     2975  . Breast cancer    . Lung cancer    . Stroke      Social History   Social History  . Marital Status: Widowed    Spouse Name: N/A  . Number of Children: N/A  . Years of Education: N/A   Social History Main Topics  . Smoking status: Never Smoker   . Smokeless tobacco: None  . Alcohol Use: No  . Drug Use: No  . Sexual Activity: Not Asked   Other Topics Concern  . None   Social History Narrative   Retired from [employer]    [place] in [place] from [place]   2 daughter and 2 sons   Likes to read and do puzzles     Current outpatient prescriptions:  .  acetaminophen (TYLENOL) 500 MG tablet, Take 500-1,000 mg by mouth every 8  (eight) hours as needed for moderate pain. , Disp: , Rfl:  .  cephALEXin (KEFLEX) 500 MG capsule, Take 1 capsule (500 mg total) by mouth 4 (four) times daily., Disp: 28 capsule, Rfl: 0 .  CRANBERRY PO, Take 1 tablet by mouth daily., Disp: , Rfl:  .  diphenhydrAMINE (BENADRYL) 25 MG tablet, Take 25 mg by mouth at bedtime as needed for sleep. , Disp: , Rfl:  .  ELIQUIS 5 MG TABS tablet, Take 1 tablet (5 mg total) by mouth 2 (two) times daily., Disp: 180 tablet, Rfl: 1 .  furosemide (LASIX) 40 MG tablet, Take 1 tablet (40 mg total) by mouth daily., Disp: 90 tablet, Rfl: 3 .  lisinopril (PRINIVIL,ZESTRIL) 10 MG tablet, Take 1 tablet (10 mg total) by mouth daily. (Patient taking differently: Take 15 mg by mouth daily. ), Disp: 30 tablet, Rfl: 0 .  meloxicam (MOBIC) 15 MG tablet, Take 1 tablet (15 mg total) by mouth daily., Disp: 30 tablet, Rfl: 3 .  metoprolol succinate (TOPROL-XL) 50 MG 24 hr tablet, Take 1 and 1/2 tablet daily. (Patient taking differently: Take 75 mg  by mouth daily. ), Disp: 135 tablet, Rfl: 3 .  Nystatin (NYAMYC) 100000 UNIT/GM POWD, Apply as needed (Patient taking differently: Apply 1 g topically daily as needed (rash). Apply as neede), Disp: 60 g, Rfl: 3 .  pantoprazole (PROTONIX) 40 MG tablet, Take 1 tablet (40 mg total) by mouth daily., Disp: 90 tablet, Rfl: 0 .  potassium chloride SA (K-DUR,KLOR-CON) 20 MEQ tablet, TAKE 1 TABLET BY MOUTH EVERY DAY, Disp: 90 tablet, Rfl: 1  EXAM:  Filed Vitals:   03/17/15 1136  BP: 138/70  Pulse: 74  Temp: 98.1 F (36.7 C)    Body mass index is 29.89 kg/(m^2).  GENERAL: vitals reviewed and listed above, alert, oriented, appears well hydrated and in no acute distress  HEENT: atraumatic, conjunttiva clear, no obvious abnormalities on inspection of external nose and ears  NECK: no obvious masses on inspection  LUNGS: clear to auscultation bilaterally, no wheezes, rales or rhonchi, good air movement  CV: HRRR, no peripheral  edema  ABD: no CVA TTP  MS: moves all extremities without noticeable abnormality  PSYCH: pleasant and cooperative, no obvious depression or anxiety  ASSESSMENT AND PLAN:  Discussed the following assessment and plan:  Acute cystitis with hematuria  -seems to be doing well with symptoms resolved -Patient advised to return or notify a doctor immediately if symptoms worsen or persist or new concerns arise.  There are no Patient Instructions on file for this visit.   Alyssa Cameron.

## 2015-03-19 ENCOUNTER — Other Ambulatory Visit: Payer: Self-pay | Admitting: Adult Health

## 2015-03-22 ENCOUNTER — Other Ambulatory Visit: Payer: Self-pay | Admitting: Adult Health

## 2015-03-25 ENCOUNTER — Ambulatory Visit (INDEPENDENT_AMBULATORY_CARE_PROVIDER_SITE_OTHER): Payer: Medicare Other | Admitting: Adult Health

## 2015-03-25 ENCOUNTER — Encounter: Payer: Self-pay | Admitting: Adult Health

## 2015-03-25 VITALS — BP 160/90 | HR 87 | Temp 98.6°F | Ht 66.0 in | Wt 182.9 lb

## 2015-03-25 DIAGNOSIS — J209 Acute bronchitis, unspecified: Secondary | ICD-10-CM | POA: Diagnosis not present

## 2015-03-25 MED ORDER — METHYLPREDNISOLONE 4 MG PO TBPK: ORAL_TABLET | ORAL | Status: DC

## 2015-03-25 MED ORDER — DOXYCYCLINE HYCLATE 100 MG PO CAPS: 100.0000 mg | ORAL_CAPSULE | Freq: Two times a day (BID) | ORAL | Status: DC

## 2015-03-25 NOTE — Progress Notes (Signed)
Pre visit review using our clinic review tool, if applicable. No additional management support is needed unless otherwise documented below in the visit note. 

## 2015-03-25 NOTE — Progress Notes (Signed)
Subjective:    Patient ID: Alyssa BranchRita Cameron, female    DOB: September 04, 1918, 82 y.o.   MRN: [id number]  HPI  82 year old active female who presents to the office today for productive cough, mid sternal chest congestion and wheezing for four days. She denies any fever, nausea, vomiting, or diarrhea. Denies being short of breath.    Review of Systems  Constitutional: Positive for activity change and fatigue. Negative for fever and diaphoresis.  HENT: Positive for congestion. Negative for ear discharge, ear pain, nosebleeds, postnasal drip, rhinorrhea, sinus pressure and sore throat.   Respiratory: Positive for cough and wheezing.   Cardiovascular: Negative.   Neurological: Negative.    Past Medical History  Diagnosis Date  . Hypertension   . A-fib (HCC)   . Spinal stenosis   . History of colon polyps   . CHF (congestive heart failure) (HCC)   . Arthritis   . Urine incontinence   . Frequent UTI   . Diverticulitis   . Hx of colonic polyps     2013 - 2 tubular adenoma sigmoid and Transverse polyp  . Internal hemorrhoids   . Pulmonary hypertension (HCC) 01/07/2015    Social History   Social History  . Marital Status: Widowed    Spouse Name: N/A  . Number of Children: N/A  . Years of Education: N/A   Occupational History  . Not on file.   Social History Main Topics  . Smoking status: Never Smoker   . Smokeless tobacco: Not on file  . Alcohol Use: No  . Drug Use: No  . Sexual Activity: Not on file   Other Topics Concern  . Not on file   Social History Narrative   Retired from [employer]    [place] and [place] from [place]   2 daughter and 2 sons   Likes to read and do puzzles    Past Surgical History  Procedure Laterality Date  . Cataract extraction    . Gallbladder surgery  1970  . Breast biopsy  2010  . Appendectomy  1941  . Tonsillectomy and adenoidectomy  1941  . Vaginal hysterectomy      Family History  Problem Relation Age of Onset  . Heart failure Mother      4864  . Heart failure Father     4575  . Breast cancer    . Lung cancer    . Stroke      Allergies  Allergen Reactions  . Demerol [Meperidine] Nausea Only  . Vicodin [Hydrocodone-Acetaminophen] Nausea And Vomiting    Current Outpatient Prescriptions on File Prior to Visit  Medication Sig Dispense Refill  . acetaminophen (TYLENOL) 500 MG tablet Take 500-1,000 mg by mouth every 8 (eight) hours as needed for moderate pain.     Marland Kitchen. CRANBERRY PO Take 1 tablet by mouth daily.    . diphenhydrAMINE (BENADRYL) 25 MG tablet Take 25 mg by mouth at bedtime as needed for sleep.     Marland Kitchen. ELIQUIS 5 MG TABS tablet Take 1 tablet by mouth two  times daily 180 tablet 1  . furosemide (LASIX) 40 MG tablet Take 1 tablet (40 mg total) by mouth daily. 90 tablet 3  . lisinopril (PRINIVIL,ZESTRIL) 10 MG tablet Take 1 tablet (10 mg total) by mouth daily. (Patient taking differently: Take 15 mg by mouth daily. ) 30 tablet 0  . lisinopril (PRINIVIL,ZESTRIL) 10 MG tablet Take 1 tablet by mouth  daily 90 tablet 2  . meloxicam (  MOBIC) 15 MG tablet Take 1 tablet (15 mg total) by mouth daily. 30 tablet 3  . metoprolol succinate (TOPROL-XL) 50 MG 24 hr tablet Take 1 and 1/2 tablet daily. (Patient taking differently: Take 75 mg by mouth daily. ) 135 tablet 3  . Nystatin (NYAMYC) 100000 UNIT/GM POWD Apply as needed (Patient taking differently: Apply 1 g topically daily as needed (rash). Apply as neede) 60 g 3  . pantoprazole (PROTONIX) 40 MG tablet Take 1 tablet (40 mg total) by mouth daily. 90 tablet 0  . potassium chloride SA (K-DUR,KLOR-CON) 20 MEQ tablet TAKE 1 TABLET BY MOUTH EVERY DAY 90 tablet 1   No current facility-administered medications on file prior to visit.    BP 160/90 mmHg  Pulse 87  Temp(Src) 98.6 F (37 C) (Oral)  Ht 5\' 6"  (1.676 m)  Wt 182 lb 14.4 oz (82.963 kg)  BMI 29.53 kg/m2  SpO2 96%       Objective:   Physical Exam  Constitutional: She is oriented to person, place, and time. She  appears well-developed and well-nourished. No distress.  Neck: Normal range of motion. Neck supple.  Cardiovascular: Normal rate, regular rhythm, normal heart sounds and intact distal pulses.  Exam reveals no gallop and no friction rub.   No murmur heard. Pulmonary/Chest: Effort normal. No respiratory distress. She has wheezes (full fields that is resolved with cough. No rhonchi noted). She has no rales. She exhibits no tenderness.  Lymphadenopathy:    She has no cervical adenopathy.  Neurological: She is alert and oriented to person, place, and time.  Skin: Skin is warm and dry. No rash noted. She is not diaphoretic. No erythema. No pallor.  Psychiatric: She has a normal mood and affect. Her behavior is normal. Judgment and thought content normal.  Nursing note and vitals reviewed.     Assessment & Plan:  1. Acute bronchitis, unspecified organism - Likely in nature. Due to age will start her on abx therapy. Little concern for pneumonia at this time.  - methylPREDNISolone (MEDROL DOSEPAK) 4 MG TBPK tablet; Take as  directed  Dispense: 21 tablet; Refill: 0 - doxycycline (VIBRAMYCIN) 100 MG capsule; Take 1 capsule (100 mg total) by mouth 2 (two) times daily.  Dispense: 20 capsule; Refill: 0 - Follow up if no improvement in 2-3 days, or sooner if needed.

## 2015-03-25 NOTE — Patient Instructions (Addendum)
It was great seeing you again!  Your exam is consistent with bronchitis.. I have sent in a prescription for Doxycycline (antibiotic) and Prednisone. Take each as directed.   If you develop a fever or your symptoms get worse, please follow up right away.    Acute Bronchitis Bronchitis is inflammation of the airways that extend from the windpipe into the lungs (bronchi). The inflammation often causes mucus to develop. This leads to a cough, which is the most common symptom of bronchitis.  In acute bronchitis, the condition usually develops suddenly and goes away over time, usually in a couple weeks. Smoking, allergies, and asthma can make bronchitis worse. Repeated episodes of bronchitis may cause further lung problems.  CAUSES Acute bronchitis is most often caused by the same virus that causes a cold. The virus can spread from person to person (contagious) through coughing, sneezing, and touching contaminated objects. SIGNS AND SYMPTOMS   Cough.   Fever.   Coughing up mucus.   Body aches.   Chest congestion.   Chills.   Shortness of breath.   Sore throat.  DIAGNOSIS  Acute bronchitis is usually diagnosed through a physical exam. Your health care provider will also ask you questions about your medical history. Tests, such as chest X-rays, are sometimes done to rule out other conditions.  TREATMENT  Acute bronchitis usually goes away in a couple weeks. Oftentimes, no medical treatment is necessary. Medicines are sometimes given for relief of fever or cough. Antibiotic medicines are usually not needed but may be prescribed in certain situations. In some cases, an inhaler may be recommended to help reduce shortness of breath and control the cough. A cool mist vaporizer may also be used to help thin bronchial secretions and make it easier to clear the chest.  HOME CARE INSTRUCTIONS  Get plenty of rest.   Drink enough fluids to keep your urine clear or pale yellow (unless you  have a medical condition that requires fluid restriction). Increasing fluids may help thin your respiratory secretions (sputum) and reduce chest congestion, and it will prevent dehydration.   Take medicines only as directed by your health care provider.  If you were prescribed an antibiotic medicine, finish it all even if you start to feel better.  Avoid smoking and secondhand smoke. Exposure to cigarette smoke or irritating chemicals will make bronchitis worse. If you are a smoker, consider using nicotine gum or skin patches to help control withdrawal symptoms. Quitting smoking will help your lungs heal faster.   Reduce the chances of another bout of acute bronchitis by washing your hands frequently, avoiding people with cold symptoms, and trying not to touch your hands to your mouth, nose, or eyes.   Keep all follow-up visits as directed by your health care provider.  SEEK MEDICAL CARE IF: Your symptoms do not improve after 1 week of treatment.  SEEK IMMEDIATE MEDICAL CARE IF:  You develop an increased fever or chills.   You have chest pain.   You have severe shortness of breath.  You have bloody sputum.   You develop dehydration.  You faint or repeatedly feel like you are going to pass out.  You develop repeated vomiting.  You develop a severe headache. MAKE SURE YOU:   Understand these instructions.  Will watch your condition.  Will get help right away if you are not doing well or get worse.   This information is not intended to replace advice given to you by your health care provider. Make sure  you discuss any questions you have with your health care provider.   Document Released: 04/07/2004 Document Revised: 03/21/2014 Document Reviewed: 08/21/2012 Elsevier Interactive Patient Education [employer].

## 2015-03-30 NOTE — Telephone Encounter (Signed)
[facility] - Client TELEPHONE ADVICE RECORD [facility]  Patient Name: Alyssa BranchRITA Cameron  DOB: [birth year]    Initial Comment Caller states has bronchitis. Taking prednisone. Making her nauseous. Not wanting to eat. Only has 2 more tabs left. Wondering if she can stop the med? Spitting up mucous sometimes and still coughing and wheezing occasionally. Also feels weak.    Nurse Assessment  Nurse: Izola PriceMyers, RN, Cala BradfordKimberly Date/Time (Eastern Time): 03/30/2015 5:22:14 PM  Confirm and document reason for call. If symptomatic, describe symptoms. You must click the next button to save text entered. ---Caller states has bronchitis. Taking prednisone. Making her nauseous. Not wanting to eat. Only has 2 more tabs left. Wondering if she can stop the med? Spitting up mucus sometimes and still coughing and wheezing occasionally. Also feels weak. Also Shaky from prednisone. Mucus is clear but still coughing. Was 182 lbs now down to 178. Prednisone is 4 mg. no fever.  Has the patient traveled out of the country within the last 30 days? ---No  Does the patient have any new or worsening symptoms? ---Yes  Will a triage be completed? ---Yes  Related visit to physician within the last 2 weeks? ---Yes  Does the PT have any chronic conditions? (i.e. diabetes, asthma, etc.) ---Yes  List chronic conditions. ---A fib, arthritis, spinal stenosis, htn (lisinopril, metoprolol, elaquis, mobic)  Is this a behavioral health or substance abuse call? ---No     Guidelines    Guideline Title Affirmed Question Affirmed Notes  Cough - Acute Productive [1] Known COPD or other severe lung disease (i.e., bronchiectasis, cystic fibrosis, lung surgery) AND [2] worsening symptoms (i.e., increased sputum purulence or amount, increased breathing difficulty    Final Disposition User   See Physician within 24 Hours Izola PriceMyers, RN, Cala BradfordKimberly    Comments  verified phone number in recording; [phone]  Also  takes doxycycline 100mg  for bronchitis (has 3 more pills to take).  Appt was made for tues. at 1:30 with Dr. Fabian SharpPanosh (dr. Caryl Neverburchette only had 9:45 appt and pt wanted afternoon but she would like to see Angela Adamory Nafzinger if possible as she is familiar with him)   Referrals  REFERRED TO PCP OFFICE   Disagree/Comply: Comply

## 2015-03-31 ENCOUNTER — Ambulatory Visit: Payer: Self-pay | Admitting: Internal Medicine

## 2015-03-31 ENCOUNTER — Encounter: Payer: Self-pay | Admitting: Adult Health

## 2015-03-31 ENCOUNTER — Other Ambulatory Visit: Payer: Self-pay | Admitting: Adult Health

## 2015-03-31 ENCOUNTER — Ambulatory Visit (INDEPENDENT_AMBULATORY_CARE_PROVIDER_SITE_OTHER): Payer: Medicare Other | Admitting: Adult Health

## 2015-03-31 ENCOUNTER — Ambulatory Visit (INDEPENDENT_AMBULATORY_CARE_PROVIDER_SITE_OTHER)
Admission: RE | Admit: 2015-03-31 | Discharge: 2015-03-31 | Disposition: A | Discharge status: Home / Self Care | Payer: Medicare Other | Source: Ambulatory Visit | Attending: Adult Health | Admitting: Adult Health

## 2015-03-31 DIAGNOSIS — R059 Cough, unspecified: Secondary | ICD-10-CM

## 2015-03-31 DIAGNOSIS — K219 Gastro-esophageal reflux disease without esophagitis: Secondary | ICD-10-CM | POA: Diagnosis not present

## 2015-03-31 DIAGNOSIS — R058 Other specified cough: Secondary | ICD-10-CM

## 2015-03-31 DIAGNOSIS — R05 Cough: Secondary | ICD-10-CM

## 2015-03-31 MED ORDER — ONDANSETRON HCL 4 MG PO TABS: 4.0000 mg | ORAL_TABLET | Freq: Three times a day (TID) | ORAL | Status: DC | PRN

## 2015-03-31 MED ORDER — PANTOPRAZOLE SODIUM 40 MG PO TBEC: 40.0000 mg | DELAYED_RELEASE_TABLET | Freq: Every day | ORAL | Status: DC

## 2015-03-31 MED ORDER — BENZONATATE 200 MG PO CAPS
200.0000 mg | ORAL_CAPSULE | Freq: Three times a day (TID) | ORAL | Status: DC | PRN
Start: 2015-03-31 — End: 2015-05-04

## 2015-03-31 NOTE — Patient Instructions (Signed)
I have sent in a prescription for Tessalon Pearls and Protonix. The tessalon pearls will be for the cough. Also continue to use Mucinex.   Drink plenty of water and try and eat.   I will follow up with you throughout the week. If you have any fevers please let me know.

## 2015-03-31 NOTE — Telephone Encounter (Signed)
I would like her to go get a chest xray today. She needs to go to the [facility] across from [place]. I have also sent in a prescription for zofran, if she needs it for her nausea. She can stop the prednisone

## 2015-03-31 NOTE — Progress Notes (Signed)
Subjective:    Patient ID: Alyssa Cameron, female    DOB: 1918/09/17, 82 y.o.   MRN: [id number]  HPI  82 year old female who presents to the office today for follow up regarding her cough. I last saw here on 03/24/2014 at which time she was started on Doxycycline and Prednisone. She has one dose of prednisone left, but has not responded well to the therapy. She feels as though the prednisone has made her "shakey" and her cough has not resolved. She has two doses of Doxycycline left.   She continues to have a [productive cough which she endorses th mucus is mostly clear, but has episodes of grey mucus. She has lost her appetite and has not been eating well, which has made her feal weak.   She denies any feeling of being acutely ill, fevers, nausea, vomiting, diarrhea.   She had a chest xray done today that showed   IMPRESSION: There is bilateral diffuse interstitial thickening as can be seen with chronic disease versus interstitial edema or interstitial pneumonitis secondary to an infectious or inflammatory etiology.  Review of Systems  Constitutional: Positive for activity change, appetite change and fatigue. Negative for fever, chills and diaphoresis.  HENT: Positive for congestion. Negative for postnasal drip, rhinorrhea, sinus pressure and sore throat.   Respiratory: Positive for cough and wheezing. Negative for chest tightness and shortness of breath.   Cardiovascular: Negative.   Musculoskeletal: Negative.   Skin: Negative.   Neurological: Negative.   Psychiatric/Behavioral: Negative for sleep disturbance.     Past Medical History  Diagnosis Date  . Hypertension   . A-fib (HCC)   . Spinal stenosis   . History of colon polyps   . CHF (congestive heart failure) (HCC)   . Arthritis   . Urine incontinence   . Frequent UTI   . Diverticulitis   . Hx of colonic polyps     2013 - 2 tubular adenoma sigmoid and Transverse polyp  . Internal hemorrhoids   . Pulmonary hypertension  (HCC) 01/07/2015    Social History   Social History  . Marital Status: Widowed    Spouse Name: N/A  . Number of Children: N/A  . Years of Education: N/A   Occupational History  . Not on file.   Social History Main Topics  . Smoking status: Never Smoker   . Smokeless tobacco: Not on file  . Alcohol Use: No  . Drug Use: No  . Sexual Activity: Not on file   Other Topics Concern  . Not on file   Social History Narrative   Retired from [employer] in [place] and moved here from [place]   2 daughter and 2 sons   Likes to read and do puzzles    Past Surgical History  Procedure Laterality Date  . Cataract extraction    . Gallbladder surgery  1970  . Breast biopsy  2010  . Appendectomy  1941  . Tonsillectomy and adenoidectomy  1941  . Vaginal hysterectomy      Family History  Problem Relation Age of Onset  . Heart failure Mother     74  . Heart failure Father     64  . Breast cancer    . Lung cancer    . Stroke      Allergies  Allergen Reactions  . Demerol [Meperidine] Nausea Only  . Vicodin [Hydrocodone-Acetaminophen] Nausea And Vomiting    Current Outpatient Prescriptions on File Prior to Visit  Medication Sig Dispense Refill  . acetaminophen (TYLENOL) 500 MG tablet Take 500-1,000 mg by mouth every 8 (eight) hours as needed for moderate pain.     Marland Kitchen CRANBERRY PO Take 1 tablet by mouth daily.    . diphenhydrAMINE (BENADRYL) 25 MG tablet Take 25 mg by mouth at bedtime as needed for sleep.     Marland Kitchen doxycycline (VIBRAMYCIN) 100 MG capsule Take 1 capsule (100 mg total) by mouth 2 (two) times daily. 20 capsule 0  . ELIQUIS 5 MG TABS tablet Take 1 tablet by mouth two  times daily 180 tablet 1  . furosemide (LASIX) 40 MG tablet Take 1 tablet (40 mg total) by mouth daily. 90 tablet 3  . lisinopril (PRINIVIL,ZESTRIL) 10 MG tablet Take 1 tablet (10 mg total) by mouth daily. (Patient taking differently: Take 15 mg by mouth daily. ) 30 tablet 0  . lisinopril (PRINIVIL,ZESTRIL)  10 MG tablet Take 1 tablet by mouth  daily 90 tablet 2  . meloxicam (MOBIC) 15 MG tablet Take 1 tablet (15 mg total) by mouth daily. 30 tablet 3  . methylPREDNISolone (MEDROL DOSEPAK) 4 MG TBPK tablet Take as  directed 21 tablet 0  . metoprolol succinate (TOPROL-XL) 50 MG 24 hr tablet Take 1 and 1/2 tablet daily. (Patient taking differently: Take 75 mg by mouth daily. ) 135 tablet 3  . Nystatin (NYAMYC) 100000 UNIT/GM POWD Apply as needed (Patient taking differently: Apply 1 g topically daily as needed (rash). Apply as neede) 60 g 3  . ondansetron (ZOFRAN) 4 MG tablet Take 1 tablet (4 mg total) by mouth every 8 (eight) hours as needed for nausea or vomiting. 20 tablet 0  . potassium chloride SA (K-DUR,KLOR-CON) 20 MEQ tablet TAKE 1 TABLET BY MOUTH EVERY DAY 90 tablet 1   No current facility-administered medications on file prior to visit.    There were no vitals taken for this visit.       Objective:   Physical Exam  Constitutional: She is oriented to person, place, and time. She appears well-developed and well-nourished. No distress.  Neck: Normal range of motion. Neck supple. No thyromegaly present.  Cardiovascular: Normal rate, regular rhythm, normal heart sounds and intact distal pulses.  Exam reveals no gallop and no friction rub.   No murmur heard. Pulmonary/Chest: Effort normal. No respiratory distress. She has wheezes (decreased breath sounds at bases. Wheezing at bases that is cleared with cough). She has no rales. She exhibits no tenderness.  Musculoskeletal: Normal range of motion. She exhibits edema (non pitting. ). She exhibits no tenderness.  Lymphadenopathy:    She has no cervical adenopathy.  Neurological: She is alert and oriented to person, place, and time.  Skin: Skin is warm and dry. No rash noted. She is not diaphoretic. No erythema. No pallor.  Psychiatric: She has a normal mood and affect. Her behavior is normal. Judgment and thought content normal.  Nursing note  and vitals reviewed.      Assessment & Plan:   1. Cough - Likely due to viral illness. Chest x ray read was vague. I doubt that this is a pulmonary edema releated to CHF. Her legs are less swollen than normal during this visit. She does also have a history of pulmonary hypertension but I am not convinced that this is a result of this.  - benzonatate (TESSALON) 200 MG capsule; Take 1 capsule (200 mg total) by mouth 3 (three) times daily as needed for cough.  Dispense: 20 capsule; Refill: 2 -  Will have close commmunication with patient during the week to see how she is doing.  - She refused BNP or other labs at this time.  - Use Mucinex, stay hydrated and drink Boost.  - Follow up immediately with any fevers.  2. Gastroesophageal reflux disease without esophagitis  - pantoprazole (PROTONIX) 40 MG tablet; Take 1 tablet (40 mg total) by mouth daily.  Dispense: 90 tablet; Refill: 0

## 2015-03-31 NOTE — Telephone Encounter (Signed)
Pt is aware and also she will see Kandee Keen at 11:15

## 2015-04-03 ENCOUNTER — Telehealth: Payer: Self-pay

## 2015-04-03 DIAGNOSIS — M1990 Unspecified osteoarthritis, unspecified site: Secondary | ICD-10-CM

## 2015-04-03 NOTE — Telephone Encounter (Signed)
Pt left a vm. Pt called and states she is not feeling well.  Called and left a message for pt to return call.

## 2015-04-03 NOTE — Telephone Encounter (Signed)
Spoke with pt and she states she is still coughing and spitting up thick mucus but she feels better.  Pt needs a refill of meloxicam and eliquis.  Advised pt that eliquis was sent to mail order on 1.9.2016.    Per Kandee Keen pt should continue to push fluids and mucinex.  Pt verbalized understanding and states the pearles are working as well.    Pls advise on meloixcam- take once daily.  Pt would like a 90 day supply.

## 2015-04-05 NOTE — Telephone Encounter (Signed)
Ok to refill Meloxicam for 90 days. If she is able to, try tylenol or ibuprofen instead of Mobic. Prolonged use of Mobic can lead to heart attack or stroke.

## 2015-04-06 MED ORDER — MELOXICAM 15 MG PO TABS: 15.0000 mg | ORAL_TABLET | Freq: Every day | ORAL | Status: DC

## 2015-04-06 NOTE — Telephone Encounter (Signed)
Left a message for pt to return call 

## 2015-04-06 NOTE — Telephone Encounter (Signed)
Called and spoke with pt and pt is aware.  Rx sent to [facility].  Pt states she takes the mobic in the morning and 1 tylenol in the evening and 1 before bed.  Pt states her cough has gotten better and she only has one pearle left.  Advised pt to continue Mucinex and fluids. Advised pt to call the office if further assistance was needed. Pt verbalized understanding.

## 2015-04-19 ENCOUNTER — Other Ambulatory Visit: Payer: Self-pay | Admitting: Adult Health

## 2015-04-23 ENCOUNTER — Ambulatory Visit: Payer: Medicare Other | Admitting: Cardiovascular Disease

## 2015-05-03 NOTE — Progress Notes (Signed)
Cardiology Office Note   Date:  05/04/2015   ID:  Alyssa Cameron, DOB 1918/04/19, MRN [id number]  PCP:  Alyssa Frees, NP  Cardiologist:   Alyssa Hook, MD   Chief Complaint  Patient presents with  . Follow-up    no chest pain, no swelling, no shortness of breath, occ cramping, no dizziness or lightheadedness     Patient ID:  Alyssa Cameron is a 82 y.o. female with hypertension, severe pulmonary hypertensin, heart failure, and chronic atrial fibrillation.    Interval History 05/04/15: At her last appointment [name]'s metoprolol was increased to 75mg .  She has been doing well.  Her only complaint is some pain in her left shoulder.  She also had bronchitis but that has resolved.  She denies chest pain, shortness of breath, lightheadedness or dizziness.  She does not get any exercise due to arthritis in her knees and ankles.  She reports that her lower extremity edema has improved on lasix.  Interval History 01/19/15: At Alyssa Cameron's last appointment lasix was increased to 40 mg bid due to volume overload.  She has been feeling well.  She denies chest pain, shortness of breath, orthopnea or PND.  She thinks that her LE edema has improved significantly.  Her blood pressure has been mostly 130s-150s with one reading of 166 mmHg.  She wonders if she should increase her metoprolol.  She denies lightheadedness or dizziness.   01/05/14: Alyssa Cameron recently moved to [facility] from [place] and was seen in clinic on September 5th to establish care. At that appointment she was feeling well and was without complaint. She had previously been diagnosed with heart failure, however it was unclear whether she had systolic or diastolic heart failure. Therefore she was referred for echocardiography.  Echo revealed normal systolic function and mild mitral regurgitation. Her RV function was slightly reduced and she had moderate tricuspid regurgitation. She also had significantly elevated pulmonary pressures with peak  systolic pressure of 68 mmHg. Her IVC was dilated and there was abnormal respiratory collapse.    She notes that her blood pressure has been elevated this week.  It has ranged from 130s-150s systolic. She denies any chest pain or shortness of breath.  She also denies lower extremity edema, orthopnea or PND.    HPI 11/17/14: She is feeling well and is without complaint at this time.  She was diagnosed with atrial fibrillation approximately 3 years ago.  At that time she presented with heart failure.  She is unsure whether this was systolic or diastolic heart failure.  After starting metoprolol and diuresis she has done much better.  She denies palpitations, lightheadedness, CP, shortness of breath, LE edema, orthopnea or PND.  She moved to [place] to live with her daughter who accompanies her today.  She has been taking apixaban for anticoagulation and denies any complications.  Prior to that she was on warfarin.     Past Medical History  Diagnosis Date  . Hypertension   . A-fib (HCC)   . Spinal stenosis   . History of colon polyps   . CHF (congestive heart failure) (HCC)   . Arthritis   . Urine incontinence   . Frequent UTI   . Diverticulitis   . Hx of colonic polyps     2013 - 2 tubular adenoma sigmoid and Transverse polyp  . Internal hemorrhoids   . Pulmonary hypertension (HCC) 01/07/2015    Past Surgical History  Procedure Laterality Date  . Cataract extraction    .  Gallbladder surgery  1970  . Breast biopsy  2010  . Appendectomy  1941  . Tonsillectomy and adenoidectomy  1941  . Vaginal hysterectomy       Current Outpatient Prescriptions  Medication Sig Dispense Refill  . acetaminophen (TYLENOL) 500 MG tablet Take 500-1,000 mg by mouth every 8 (eight) hours as needed for moderate pain.     Marland Kitchen CRANBERRY PO Take 1 tablet by mouth daily.    . diphenhydrAMINE (BENADRYL) 25 MG tablet Take 25 mg by mouth at bedtime as needed for sleep.     Marland Kitchen ELIQUIS 5 MG TABS tablet Take 1  tablet by mouth two  times daily 180 tablet 1  . furosemide (LASIX) 40 MG tablet Take 1 tablet (40 mg total) by mouth daily. 90 tablet 3  . lisinopril (PRINIVIL,ZESTRIL) 10 MG tablet Take 1 tablet by mouth  daily 90 tablet 2  . lisinopril (PRINIVIL,ZESTRIL) 10 MG tablet Take 1.5 tablets (15 mg total) by mouth daily. 30 tablet 0  . metoprolol succinate (TOPROL-XL) 50 MG 24 hr tablet Take 1 and 1/2 tablet daily. (Patient taking differently: Take 75 mg by mouth daily. ) 135 tablet 3  . Nystatin (NYAMYC) 100000 UNIT/GM POWD Apply as needed (Patient taking differently: Apply 1 g topically daily as needed (rash). Apply as neede) 60 g 3  . pantoprazole (PROTONIX) 40 MG tablet Take 1 tablet (40 mg total) by mouth daily. 90 tablet 0  . potassium chloride SA (K-DUR,KLOR-CON) 20 MEQ tablet TAKE 1 TABLET BY MOUTH EVERY DAY 90 tablet 1   No current facility-administered medications for this visit.    Allergies:   Demerol and Vicodin    Social History:  The patient  reports that she has never smoked. She does not have any smokeless tobacco history on file. She reports that she does not drink alcohol or use illicit drugs.   Family History:  The patient's family history includes Heart failure in her father and mother.    ROS:  Please see the history of present illness.  All other systems are reviewed and were positive for foot numbness and knee pain due to arthritis.  Otherwise review of systems was negative.    PHYSICAL EXAM: VS:  BP 120/60 mmHg  Pulse 76  Ht  (1.676 m)  Wt 80.74 kg (178 lb)  BMI 28.74 kg/m2 , BMI Body mass index is 28.74 kg/(m^2). GENERAL:  Well appearing HEENT:  Pupils equal round and reactive, fundi not visualized, oral mucosa unremarkable NECK:  No JVD, waveform within normal limits, carotid upstroke brisk and symmetric, no bruits LYMPHATICS:  No cervical adenopathy LUNGS:  Clear to auscultation bilaterally HEART:  Irregularly irregular.  PMI not displaced or sustained,S1  and S2 within normal limits, no S3, no S4, no clicks, no rubs, III/VI holosystolic murmur at the apex.  II/VI systolic murmur at the LUSB. ABD:  Flat, positive bowel sounds normal in frequency in pitch, no bruits, no rebound, no guarding, no midline pulsatile mass, no hepatomegaly, no splenomegaly EXT:  2 plus pulses throughout, no edema, no cyanosis no clubbing SKIN:  No rashes no nodules NEURO:  Cranial nerves II through XII grossly intact, motor grossly intact throughout PSYCH:  Cognitively intact, oriented to person place and time   EKG:  EKG is not ordered today. The ekg ordered 11/17/14 demonstrates atrial fibrillation rate 98 bpm.  Low voltage precordial leads.     Recent Labs: 11/21/2014: ALT 21; Magnesium 1.9; TSH 1.609 03/09/2015: BUN 39*; Creatinine, Ser  1.40*; Hemoglobin 11.8*; Platelets 190; Potassium 4.4; Sodium 140    Lipid Panel    Component Value Date/Time   CHOL 159 11/21/2014 0820   TRIG 98 11/21/2014 0820   HDL 56 11/21/2014 0820   CHOLHDL 2.8 11/21/2014 0820   VLDL 20 11/21/2014 0820   LDLCALC 83 11/21/2014 0820      Wt Readings from Last 3 Encounters:  05/04/15 80.74 kg (178 lb)  03/25/15 82.963 kg (182 lb 14.4 oz)  03/17/15 83.961 kg (185 lb 1.6 oz)      Other studies Reviewed: Additional studies/ records that were reviewed today include:  Review of the above records demonstrates:  Please see elsewhere in the note.   ASSESSMENT AND PLAN:  # Chronic Atrial fibrillation: Rate controlled and patient tolerating metoprolol and apixaban.    # Hypertension: BP well-controlled.  Continue lisinopril, lasix, and metoprolol.  # Pulmonary hypertension: Etiology unclear.  She prefers to avoid any further work up given that she is feeling well.  Her volume status is much better on lasix.  #  CV disease prevention: Managed with diet.  Lipids at goal.  Current medicines are reviewed at length with the patient today.  The patient does not have concerns regarding  medicines.  The following changes have been made:  No changes.  Labs/ tests ordered today include:   No orders of the defined types were placed in this encounter.     Disposition:   FU with Dr. Elmarie Shiley C. Cameron in 6 months.   Signed, Alyssa Hook, MD  05/04/2015 1:08 PM    [facility]

## 2015-05-04 ENCOUNTER — Encounter: Payer: Self-pay | Admitting: Cardiovascular Disease

## 2015-05-04 ENCOUNTER — Ambulatory Visit (INDEPENDENT_AMBULATORY_CARE_PROVIDER_SITE_OTHER): Payer: Medicare Other | Admitting: Cardiovascular Disease

## 2015-05-04 VITALS — BP 120/60 | HR 76 | Ht 66.0 in | Wt 178.0 lb

## 2015-05-04 DIAGNOSIS — I10 Essential (primary) hypertension: Secondary | ICD-10-CM | POA: Diagnosis not present

## 2015-05-04 DIAGNOSIS — I482 Chronic atrial fibrillation, unspecified: Secondary | ICD-10-CM

## 2015-05-04 DIAGNOSIS — I110 Hypertensive heart disease with heart failure: Secondary | ICD-10-CM

## 2015-05-04 DIAGNOSIS — I272 Other secondary pulmonary hypertension: Secondary | ICD-10-CM

## 2015-05-04 MED ORDER — LISINOPRIL 10 MG PO TABS: 15.0000 mg | ORAL_TABLET | Freq: Every day | ORAL | Status: DC

## 2015-05-04 NOTE — Patient Instructions (Signed)
NO CHANGES IN CURRENT MEDICATIONS   Your physician wants you to follow-up in 6 MONTHS WITH DR [facility].  You will receive a reminder letter in the mail two months in advance. If you don't receive a letter, please call our office to schedule the follow-up appointment.   If you need a refill on your cardiac medications before your next appointment, please call your pharmacy.

## 2015-05-19 ENCOUNTER — Encounter: Payer: Self-pay | Admitting: Adult Health

## 2015-05-19 ENCOUNTER — Ambulatory Visit (INDEPENDENT_AMBULATORY_CARE_PROVIDER_SITE_OTHER): Payer: Medicare Other | Admitting: Adult Health

## 2015-05-19 VITALS — BP 130/80 | HR 88 | Temp 98.3°F | Ht 66.0 in | Wt 175.2 lb

## 2015-05-19 DIAGNOSIS — I509 Heart failure, unspecified: Secondary | ICD-10-CM | POA: Diagnosis not present

## 2015-05-19 DIAGNOSIS — K219 Gastro-esophageal reflux disease without esophagitis: Secondary | ICD-10-CM

## 2015-05-19 DIAGNOSIS — I10 Essential (primary) hypertension: Secondary | ICD-10-CM

## 2015-05-19 DIAGNOSIS — R350 Frequency of micturition: Secondary | ICD-10-CM | POA: Diagnosis not present

## 2015-05-19 LAB — BASIC METABOLIC PANEL
BUN: 24 mg/dL — AB (ref 6–23)
CHLORIDE: 105 meq/L (ref 96–112)
CO2: 24 mEq/L (ref 19–32)
Calcium: 9.9 mg/dL (ref 8.4–10.5)
Creatinine, Ser: 1.04 mg/dL (ref 0.40–1.20)
GFR: 52.41 mL/min — AB (ref >60.00)
Glucose, Bld: 112 mg/dL — ABNORMAL HIGH (ref 70–99)
POTASSIUM: 4.3 meq/L (ref 3.5–5.1)
SODIUM: 140 meq/L (ref 135–145)

## 2015-05-19 MED ORDER — PANTOPRAZOLE SODIUM 40 MG PO TBEC: 40.0000 mg | DELAYED_RELEASE_TABLET | Freq: Every day | ORAL | Status: DC

## 2015-05-19 NOTE — Addendum Note (Signed)
Addended by: Azucena FreedMILLNER, Lennox Leikam C on: 05/19/2015 03:20 PM   Modules accepted: Orders

## 2015-05-19 NOTE — Patient Instructions (Signed)
As always, it was so nice to see you!  Cut back on the Lasix to 20 mg. If you gain 5 pounds or more in a week, then restart the 40 mg.   I will follow up with you regarding your blood work.   Please let me know if you need anything.

## 2015-05-19 NOTE — Progress Notes (Signed)
Subjective:    Patient ID: Alyssa Cameron, female    DOB: 08-06-1918, 82 y.o.   MRN: [id number]  HPI  82 year old active female who presents to the office today for 6 month follow up due to hypertension and CHF. She reports that she is doing well over all. Is suffering from seasonal allergies but it is controlled with Claritin. Her only other complaint is that of urinary frequency after taking lasix. Her current dose is 40 mg.   She is eating well and enjoying where she is living. She is going to start doing balance classes at her facility.   Denies any chest pain, shortness of breath, fevers, or feeling ill.   She last saw her cardiologist, Dr. Duke Salviaandolph one month ago and was cleared for 6 months.   Review of Systems  Constitutional: Negative.   HENT: Positive for postnasal drip and rhinorrhea. Negative for sinus pressure.   Respiratory: Negative.   Cardiovascular: Negative.   Gastrointestinal: Negative.   Neurological: Negative.   All other systems reviewed and are negative.  Past Medical History  Diagnosis Date  . Hypertension   . A-fib (HCC)   . Spinal stenosis   . History of colon polyps   . CHF (congestive heart failure) (HCC)   . Arthritis   . Urine incontinence   . Frequent UTI   . Diverticulitis   . Hx of colonic polyps     2013 - 2 tubular adenoma sigmoid and Transverse polyp  . Internal hemorrhoids   . Pulmonary hypertension (HCC) 01/07/2015    Social History   Social History  . Marital Status: Widowed    Spouse Name: N/A  . Number of Children: N/A  . Years of Education: N/A   Occupational History  . Not on file.   Social History Main Topics  . Smoking status: Never Smoker   . Smokeless tobacco: Not on file  . Alcohol Use: No  . Drug Use: No  . Sexual Activity: Not on file   Other Topics Concern  . Not on file   Social History Narrative   Retired from [employer]    Lived in [place] and moved here from [place]   2 daughter and 2 sons   Likes to read and do  puzzles    Past Surgical History  Procedure Laterality Date  . Cataract extraction    . Gallbladder surgery  1970  . Breast biopsy  2010  . Appendectomy  1941  . Tonsillectomy and adenoidectomy  1941  . Vaginal hysterectomy      Family History  Problem Relation Age of Onset  . Heart failure Mother     3864  . Heart failure Father     7275  . Breast cancer    . Lung cancer    . Stroke      Allergies  Allergen Reactions  . Demerol [Meperidine] Nausea Only  . Vicodin [Hydrocodone-Acetaminophen] Nausea And Vomiting    Current Outpatient Prescriptions on File Prior to Visit  Medication Sig Dispense Refill  . acetaminophen (TYLENOL) 500 MG tablet Take 500-1,000 mg by mouth every 8 (eight) hours as needed for moderate pain.     Marland Kitchen. CRANBERRY PO Take 1 tablet by mouth daily.    . diphenhydrAMINE (BENADRYL) 25 MG tablet Take 25 mg by mouth at bedtime as needed for sleep.     Marland Kitchen. ELIQUIS 5 MG TABS tablet Take 1 tablet by mouth two  times daily 180 tablet 1  .  furosemide (LASIX) 40 MG tablet Take 1 tablet (40 mg total) by mouth daily. 90 tablet 3  . lisinopril (PRINIVIL,ZESTRIL) 10 MG tablet Take 1.5 tablets (15 mg total) by mouth daily. 30 tablet 0  . metoprolol succinate (TOPROL-XL) 50 MG 24 hr tablet Take 1 and 1/2 tablet daily. (Patient taking differently: Take 75 mg by mouth daily. ) 135 tablet 3  . Nystatin (NYAMYC) 100000 UNIT/GM POWD Apply as needed (Patient taking differently: Apply 1 g topically daily as needed (rash). Apply as neede) 60 g 3  . potassium chloride SA (K-DUR,KLOR-CON) 20 MEQ tablet TAKE 1 TABLET BY MOUTH EVERY DAY 90 tablet 1   No current facility-administered medications on file prior to visit.    BP 130/80 mmHg  Pulse 88  Temp(Src) 98.3 F (36.8 C) (Oral)  Ht  (1.676 m)  Wt 175 lb 3.2 oz (79.47 kg)  BMI 28.29 kg/m2  SpO2 98%       Objective:   Physical Exam  Constitutional: She is oriented to person, place, and time. She appears well-developed  and well-nourished. No distress.  Cardiovascular: Normal rate, normal heart sounds and intact distal pulses.  An irregularly irregular rhythm present. Exam reveals no gallop and no friction rub.   No murmur heard. Pulmonary/Chest: Effort normal and breath sounds normal. No respiratory distress. She has no wheezes. She has no rales. She exhibits no tenderness.  Abdominal: Soft. Bowel sounds are normal. She exhibits no distension and no mass. There is no tenderness. There is no rebound.  Musculoskeletal: Normal range of motion. She exhibits no edema or tenderness.  Trace lower extremity edema.   Neurological: She is alert and oriented to person, place, and time.  Skin: Skin is warm and dry. No rash noted. She is not diaphoretic. No erythema. No pallor.  Psychiatric: She has a normal mood and affect. Her behavior is normal. Thought content normal.  Nursing note and vitals reviewed.     Assessment & Plan:  1. Gastroesophageal reflux disease without esophagitis - pantoprazole (PROTONIX) 40 MG tablet; Take 1 tablet (40 mg total) by mouth daily.  Dispense: 90 tablet; Refill: 2  2. Increased urinary frequency - Ok to cut back on Lasix to 20 mg daily. She understands that if she gains 5 lbs or more in a week to go back to the 40 mg.  - Basic metabolic panel - Follow up if any issues  3. Essential hypertension - Basic metabolic panel - Controlled on current dose. No changes in medication.  - Continue to monitor periodically.  4. Chronic congestive heart failure, unspecified congestive heart failure type (HCC) - Basic metabolic panel

## 2015-05-20 ENCOUNTER — Other Ambulatory Visit: Payer: Self-pay | Admitting: Adult Health

## 2015-07-20 ENCOUNTER — Telehealth: Payer: Self-pay | Admitting: Family Medicine

## 2015-07-20 NOTE — Telephone Encounter (Signed)
[facility] - Client TELEPHONE ADVICE RECORD [facility] Patient Name: Alyssa Alycia RossettiRYAN Gender: Female DOB: [birth year] Age: 82 Y 5 M 19 D Return Phone Number: [phone] (Primary) Address: [place]State[place]Zip: [place] [address] Client [facility] Night - Client Client Site [facility] - Night Physician AA - PHYSICIAN, NOT LISTED Contact Type Call Who Is Calling Patient / Member / Family / Caregiver Call Type Triage / Clinical Relationship To Patient Self Return Phone Number [phone] (Primary) Chief Complaint OVERDOSE took too much medication at once Reason for Call Symptomatic / Request for Health Information Initial Comment Caller says she took 75 mg of her Metropolof and she jut took another dose by mistake thinking it was Tylenol. Sees Carie (not listed) PreDisposition InappropriateToAsk Translation No Nurse Assessment Nurse: Dareen PianoAnderson, RN, Marcelino DusterMichelle Date/Time (Eastern Time): 07/19/2015 10:58:17 PM Confirm and document reason for call. If symptomatic, describe symptoms. You must click the next button to save text entered. ---pt took 150mg  of metoprolol Has the patient traveled out of the country within the last 30 days? ---No Does the patient have any new or worsening symptoms? ---Yes Will a triage be completed? ---Yes Related visit to physician within the last 2 weeks? ---No Does the PT have any chronic conditions? (i.e. diabetes, asthma, etc.) ---Yes List chronic conditions. ---arthritis, a fib Is this a behavioral health or substance abuse call? ---No Guidelines Guideline Title Affirmed Question Affirmed Notes Nurse Date/Time (Eastern Time) Poisoning [1] DOUBLE DOSE (an extra dose or lesser amount) of prescription drug AND [2] NO symptoms Donato Schultznderson, RN, Michelle 07/19/2015 10:59:05 PM Disp. Time Lamount Cohen(Eastern Time) Disposition Final User 07/19/2015 10:52:30 PM Send to Urgent Ladoris GeneQueue Noriega,  Rosie 07/19/2015 11:05:11 PM Call PCP Now Dareen PianoAnderson, RN, Marcelino DusterMichelle PLEASE NOTE: All timestamps contained within this report are represented as [place] Standard Time. CONFIDENTIALTY NOTICE: This fax transmission is intended only for the addressee. It contains information that is legally privileged, confidential or otherwise protected from use or disclosure. If you are not the intended recipient, you are strictly prohibited from reviewing, disclosing, copying using or disseminating any of this information or taking any action in reliance on or regarding this information. If you have received this fax in error, please notify us immediately by telephone so that we can arrange for its return to us. Phone: [phone], Toll-Free: [phone], Fax: [phone] Page: 2 of 2 Call Id: [id number] Disp. Time Lamount Cohen(Eastern Time) Disposition Final User 07/19/2015 11:05:38 PM [facility] Anderson, RN, Marcelino DusterMichelle 07/19/2015 11:06:09 PM Send To RN Personal Dareen PianoAnderson, RN, Michelle 07/19/2015 11:20:10 PM Send To RN Personal Dareen PianoAnderson, RN, Michelle 07/19/2015 11:26:55 PM Send To RN Personal Dareen PianoAnderson, RN, Michelle 07/19/2015 11:35:37 PM Attempt made - line busy Dareen PianoAnderson, RN, [facility] 07/19/2015 11:38:51 PM Call Completed Dareen PianoAnderson, RN, Michelle 07/20/2015 12:36:21 AM Attempt made - line busy Dareen PianoAnderson, RN, [facility] 07/20/2015 12:42:09 AM Call Completed Yes Dareen PianoAnderson, RN, Lavon PaganiniMichelle Caller Understands: Yes Disagree/Comply: Comply Care Advice Given Per Guideline CALL PCP NOW: You need to discuss this with your doctor. I'll page him now. If you haven't heard from the on-call doctor within 30 minutes, call again. * You can also ask your own pharmacist at the drug store where you got the medicine. Comments User: Dennison BullaJason, [place] Date/Time Lamount Cohen(Eastern Time): 07/19/2015 11:29:11 PM per the recording the number is [phone] User: Inda CokeMichelle, Anderson, RN Date/Time (Eastern Time): 07/19/2015 11:36:24 PM attempted to call back numerous  times line busy, correct number verified by lead  PC Referrals REFERRED TO PCP OFFICE Paging DoctorName Phone DateTime Result/Outcome Message Type Notes Roxy Manns - Malcolm [phone] 07/19/2015 11:05:38 PM Paged On Call Back to Call Center Doctor Paged please call back regarding a pt. thanks Roxy Manns - MD 07/19/2015 11:38:12 PM Spoke with On Call - General Message Result pt states she has no current sx, told that the medication could lower her BP and her HR dangerously and if BP less than 90/50 or HR less than 50 pt should go to the ER to be seen or if pt has now way of monitoring vitals should go in to be seen, attempted to notify pt numerous times, line is disconnected and no longer in service

## 2015-07-20 NOTE — Telephone Encounter (Signed)
Tried to reach the pt.  Home phone has been disconnected.  Tried cell.  That belongs to her daughter.  Daughter will try to reach her and have her call back.

## 2015-07-21 NOTE — Telephone Encounter (Signed)
Spoke to the pt.  She reports that she was at her great grand-son's wedding over the weekend.  Accidentally took the extra metoprolol when she was suppose to take her Tylenol.  She denies any current sx.  [facility] a [facility].  States they come and take her bp a few times a week.  So far, she reports normal readings but unable to tell me the exact numbers.  Advised the pt to call back if not feeling well.

## 2015-07-21 NOTE — Telephone Encounter (Signed)
Will route to PCP as FYI. 

## 2015-07-21 NOTE — Telephone Encounter (Signed)
noted 

## 2015-07-27 ENCOUNTER — Other Ambulatory Visit: Payer: Self-pay | Admitting: Adult Health

## 2015-08-04 ENCOUNTER — Telehealth: Payer: Self-pay | Admitting: Adult Health

## 2015-08-04 ENCOUNTER — Other Ambulatory Visit: Payer: Self-pay

## 2015-08-04 MED ORDER — ELIQUIS 5 MG PO TABS: ORAL_TABLET | ORAL | Status: DC

## 2015-08-04 MED ORDER — LISINOPRIL 10 MG PO TABS: ORAL_TABLET | ORAL | Status: DC

## 2015-08-04 NOTE — Telephone Encounter (Signed)
Pt need new Rx for Eliquis 5 mg and lisinopril 10 mg.   Pharm:  [facility]

## 2015-08-04 NOTE — Telephone Encounter (Signed)
Rx sent in

## 2015-08-27 ENCOUNTER — Telehealth: Payer: Self-pay | Admitting: Adult Health

## 2015-08-27 ENCOUNTER — Other Ambulatory Visit: Payer: Self-pay

## 2015-08-27 MED ORDER — LISINOPRIL 10 MG PO TABS: ORAL_TABLET | ORAL | Status: DC

## 2015-08-27 NOTE — Telephone Encounter (Signed)
Pt request refill of the following:   lisinopril (PRINIVIL,ZESTRIL) 10 MG tablet  Pt said she will not received her rx till 08/31/15 and is asking if 12 pills can be called in to her local pharmacy     Phamacy:  [facility] [address]

## 2015-08-27 NOTE — Telephone Encounter (Signed)
Rx sent it to local pharmacy

## 2015-08-27 NOTE — Telephone Encounter (Signed)
Is this okay?

## 2015-08-27 NOTE — Telephone Encounter (Signed)
Go ahead and call in #[phone]

## 2015-10-06 ENCOUNTER — Encounter: Payer: Self-pay | Admitting: Adult Health

## 2015-10-06 ENCOUNTER — Ambulatory Visit (INDEPENDENT_AMBULATORY_CARE_PROVIDER_SITE_OTHER): Payer: Medicare Other | Admitting: Adult Health

## 2015-10-06 VITALS — BP 134/72 | Temp 98.9°F | Ht 66.0 in | Wt 177.1 lb

## 2015-10-06 DIAGNOSIS — R195 Other fecal abnormalities: Secondary | ICD-10-CM | POA: Diagnosis not present

## 2015-10-06 NOTE — Progress Notes (Signed)
Subjective:    Patient ID: Alyssa Cameron, female    DOB: 12-21-1918, 82 y.o.   MRN: [id number]  HPI  82 year old remarkable female who presents to the office today with the complaint of dark stools x 2 days. She reports that she was eating a lot of spinach salads and had taken pepto. Over the last two days her stools have been normal color.   She denies any abdominal pain or cramping. She does not have any acute bleeding.   Review of Systems  Constitutional: Negative.   Respiratory: Negative.   Cardiovascular: Negative.   Gastrointestinal:       Dark stools   All other systems reviewed and are negative.  Past Medical History:  Diagnosis Date  . A-fib (HCC)   . Arthritis   . CHF (congestive heart failure) (HCC)   . Diverticulitis   . Frequent UTI   . History of colon polyps   . Hx of colonic polyps    2013 - 2 tubular adenoma sigmoid and Transverse polyp  . Hypertension   . Internal hemorrhoids   . Pulmonary hypertension (HCC) 01/07/2015  . Spinal stenosis   . Urine incontinence     Social History   Social History  . Marital status: Widowed    Spouse name: N/A  . Number of children: N/A  . Years of education: N/A   Occupational History  . Not on file.   Social History Main Topics  . Smoking status: Never Smoker  . Smokeless tobacco: Not on file  . Alcohol use No  . Drug use: No  . Sexual activity: Not on file   Other Topics Concern  . Not on file   Social History Narrative   Retired from [employer] in [place] and moved here from [place]   2 daughter and 2 sons   Likes to read and do puzzles    Past Surgical History:  Procedure Laterality Date  . APPENDECTOMY  1941  . BREAST BIOPSY  2010  . CATARACT EXTRACTION    . GALLBLADDER SURGERY  1970  . TONSILLECTOMY AND ADENOIDECTOMY  1941  . VAGINAL HYSTERECTOMY      Family History  Problem Relation Age of Onset  . Heart failure Mother     40  . Heart failure Father     72  . Breast cancer    . Lung  cancer    . Stroke      Allergies  Allergen Reactions  . Demerol [Meperidine] Nausea Only  . Vicodin [Hydrocodone-Acetaminophen] Nausea And Vomiting    Current Outpatient Prescriptions on File Prior to Visit  Medication Sig Dispense Refill  . acetaminophen (TYLENOL) 500 MG tablet Take 500-1,000 mg by mouth every 8 (eight) hours as needed for moderate pain.     Marland Kitchen CRANBERRY PO Take 1 tablet by mouth daily.    . diphenhydrAMINE (BENADRYL) 25 MG tablet Take 25 mg by mouth at bedtime as needed for sleep.     Marland Kitchen ELIQUIS 5 MG TABS tablet Take 1 tablet by mouth two  times daily 180 tablet 1  . furosemide (LASIX) 40 MG tablet Take 1 tablet (40 mg total) by mouth daily. 90 tablet 3  . furosemide (LASIX) 40 MG tablet Take 1 tablet by mouth  daily 90 tablet 1  . lisinopril (PRINIVIL,ZESTRIL) 10 MG tablet Take 1 tablet by mouth  daily 30 tablet 0  . metoprolol succinate (TOPROL-XL) 50 MG 24 hr tablet Take 1  and 1/2 tablet daily. (Patient taking differently: Take 75 mg by mouth daily. ) 135 tablet 3  . Nystatin (NYAMYC) 100000 UNIT/GM POWD Apply as needed (Patient taking differently: Apply 1 g topically daily as needed (rash). Apply as neede) 60 g 3  . pantoprazole (PROTONIX) 40 MG tablet Take 1 tablet (40 mg total) by mouth daily. 90 tablet 2  . potassium chloride SA (K-DUR,KLOR-CON) 20 MEQ tablet TAKE 1 TABLET BY MOUTH EVERY DAY 90 tablet 1   No current facility-administered medications on file prior to visit.     BP 134/72 (BP Location: Left Arm, Patient Position: Sitting)   Temp 98.9 F (37.2 C) (Oral)   Ht  (1.676 m)   Wt 177 lb 1.6 oz (80.3 kg)   BMI 28.58 kg/m       Objective:   Physical Exam  Constitutional: She is oriented to person, place, and time. She appears well-developed and well-nourished. No distress.  Cardiovascular: Normal rate, regular rhythm, normal heart sounds and intact distal pulses.  Exam reveals no gallop and no friction rub.   No murmur  heard. Pulmonary/Chest: Effort normal and breath sounds normal. No respiratory distress. She has no wheezes. She has no rales. She exhibits no tenderness.  Abdominal: Soft. Bowel sounds are normal. She exhibits no distension and no mass. There is no tenderness. There is no rebound and no guarding.  Genitourinary: Rectal exam shows guaiac negative stool.  Neurological: She is alert and oriented to person, place, and time.  Skin: Skin is warm and dry. No rash noted. She is not diaphoretic. No erythema. No pallor.  Psychiatric: She has a normal mood and affect. Her behavior is normal. Judgment and thought content normal.  Nursing note and vitals reviewed.     Assessment & Plan:  1. Dark stools - Likely from pepto and spinich  - No acute bleeding currently.  - Follow up if dark stools return   Shirline Frees, NP

## 2015-10-06 NOTE — Patient Instructions (Addendum)
It was great seeing you today.   Your dark stools were more than likely from spinach and pepto bismol use  Follow up if it returns.   I will see you soon

## 2015-11-18 ENCOUNTER — Encounter: Payer: Self-pay | Admitting: Cardiovascular Disease

## 2015-11-18 ENCOUNTER — Ambulatory Visit (INDEPENDENT_AMBULATORY_CARE_PROVIDER_SITE_OTHER): Payer: Medicare Other | Admitting: Cardiovascular Disease

## 2015-11-18 VITALS — BP 155/87 | HR 95 | Ht 66.0 in | Wt 178.2 lb

## 2015-11-18 DIAGNOSIS — I482 Chronic atrial fibrillation, unspecified: Secondary | ICD-10-CM

## 2015-11-18 NOTE — Progress Notes (Signed)
Cardiology Office Note   Date:  11/19/2015   ID:  Alyssa Cameron, DOB Jul 19, 1918, MRN [id number]  PCP:  Shirline Frees, NP  Cardiologist:   Chilton Si, MD   Chief Complaint  Patient presents with  . Annual Exam    Pt states no Sx or concerns.      History of Present Illness:  Alyssa Cameron is a 82 y.o. female with hypertension, severe pulmonary hypertension, heart failure, and chronic atrial fibrillation here for follow up.  Alyssa Cameron was first seen 11/2014 after moving to [facility] from [place].  She was diagnosed with atrial fibrillation in approximately 2013.  She presented with heart failure, was diuresed and started on metoprolol.  She had an echo 12/2014 that revealed LVEF 65-70% with mild MR, moderate TR and severe pulmonary hypertension (PASP 68 mmHg).  Her IVC was dilated and there was abnormal respiratory collapse.  Her lasix was increased and her breathing and lower extremity edema improved.  She declined further evaluation of her elevated pulmonary pressures, as she was feeling well.  Metoprolol was increased 01/2015 due to elevated blood pressure.  Since then she continues to do well.  Her blood pressure is usually around 127/70.  She has not noted any lower extremity edema, orthopnea or PND.  She does not get much formal exercise but she is very active.  She lives in a retirement community but spends a lot of time with her daughter as well.   Past Medical History:  Diagnosis Date  . A-fib (HCC)   . Arthritis   . CHF (congestive heart failure) (HCC)   . Diverticulitis   . Frequent UTI   . History of colon polyps   . Hx of colonic polyps    2013 - 2 tubular adenoma sigmoid and Transverse polyp  . Hypertension   . Internal hemorrhoids   . Pulmonary hypertension (HCC) 01/07/2015  . Spinal stenosis   . Urine incontinence     Past Surgical History:  Procedure Laterality Date  . APPENDECTOMY  1941  . BREAST BIOPSY  2010  . CATARACT EXTRACTION    . GALLBLADDER SURGERY  1970  .  TONSILLECTOMY AND ADENOIDECTOMY  1941  . VAGINAL HYSTERECTOMY       Current Outpatient Prescriptions  Medication Sig Dispense Refill  . acetaminophen (TYLENOL) 500 MG tablet Take 500-1,000 mg by mouth every 8 (eight) hours as needed for moderate pain.     Marland Kitchen CRANBERRY PO Take 1 tablet by mouth daily.    . diphenhydrAMINE (BENADRYL) 25 MG tablet Take 25 mg by mouth at bedtime as needed for sleep.     Marland Kitchen ELIQUIS 5 MG TABS tablet Take 1 tablet by mouth two  times daily 180 tablet 1  . furosemide (LASIX) 40 MG tablet Take 1 tablet (40 mg total) by mouth daily. 90 tablet 3  . furosemide (LASIX) 40 MG tablet Take 1 tablet by mouth  daily 90 tablet 1  . lisinopril (PRINIVIL,ZESTRIL) 10 MG tablet Take 1 tablet by mouth  daily 30 tablet 0  . metoprolol succinate (TOPROL-XL) 50 MG 24 hr tablet Take 1 and 1/2 tablet daily. (Patient taking differently: Take 75 mg by mouth daily. ) 135 tablet 3  . Nystatin (NYAMYC) 100000 UNIT/GM POWD Apply as needed (Patient taking differently: Apply 1 g topically daily as needed (rash). Apply as neede) 60 g 3  . pantoprazole (PROTONIX) 40 MG tablet Take 1 tablet (40 mg total) by mouth daily. 90 tablet 2  .  potassium chloride SA (K-DUR,KLOR-CON) 20 MEQ tablet TAKE 1 TABLET BY MOUTH EVERY DAY 90 tablet 1   No current facility-administered medications for this visit.     Allergies:   Demerol [meperidine] and Vicodin [hydrocodone-acetaminophen]    Social History:  The patient  reports that she has never smoked. She does not have any smokeless tobacco history on file. She reports that she does not drink alcohol or use drugs.   Family History:  The patient's family history includes Heart failure in her father and mother.    ROS:  Please see the history of present illness.  All other systems are reviewed and were positive for foot numbness and knee pain due to arthritis.  Otherwise review of systems was negative.    PHYSICAL EXAM: VS:  BP (!) 155/87   Pulse 95   Ht  5\' 6"  (1.676 m)   Wt 178 lb 3.2 oz (80.8 kg)   BMI 28.76 kg/m  , BMI Body mass index is 28.76 kg/m. GENERAL:  Well appearing HEENT:  Pupils equal round and reactive, fundi not visualized, oral mucosa unremarkable NECK:  No JVD, waveform within normal limits, carotid upstroke brisk and symmetric, no bruits LYMPHATICS:  No cervical adenopathy LUNGS:  Clear to auscultation bilaterally HEART:  Irregularly irregular.  PMI not displaced or sustained,S1 and S2 within normal limits, no S3, no S4, no clicks, no rubs, III/VI holosystolic murmur at the apex.  II/VI systolic murmur at the LUSB. ABD:  Flat, positive bowel sounds normal in frequency in pitch, no bruits, no rebound, no guarding, no midline pulsatile mass, no hepatomegaly, no splenomegaly EXT:  2 plus pulses throughout, no edema, no cyanosis no clubbing SKIN:  No rashes no nodules NEURO:  Cranial nerves II through XII grossly intact, motor grossly intact throughout PSYCH:  Cognitively intact, oriented to person place and time   EKG:  EKG is ordered today. 11/18/15: Atrial fibrillation.  Rate 95 bpm.  Inferolateral T wave inversions.  The ekg ordered 11/17/14 demonstrates atrial fibrillation rate 98 bpm.  Low voltage precordial leads.     Recent Labs: 11/21/2014: ALT 21; Magnesium 1.9; TSH 1.609 03/09/2015: Hemoglobin 11.8; Platelets 190 05/19/2015: BUN 24; Creatinine, Ser 1.04; Potassium 4.3; Sodium 140    Lipid Panel    Component Value Date/Time   CHOL 159 11/21/2014 0820   TRIG 98 11/21/2014 0820   HDL 56 11/21/2014 0820   CHOLHDL 2.8 11/21/2014 0820   VLDL 20 11/21/2014 0820   LDLCALC 83 11/21/2014 0820      Wt Readings from Last 3 Encounters:  11/18/15 178 lb 3.2 oz (80.8 kg)  10/06/15 177 lb 1.6 oz (80.3 kg)  05/19/15 175 lb 3.2 oz (79.5 kg)      Other studies Reviewed: Additional studies/ records that were reviewed today include:  Review of the above records demonstrates:  Please see elsewhere in the  note.   ASSESSMENT AND PLAN:  # Chronic Atrial fibrillation: Rate controlled and patient tolerating metoprolol and apixaban.    # Hypertension: BP was initially above goal today but came down to 132/74 on repeat.  She also reports that her BP is well-controlled at home.  Therefore we will not make any changes at this time.  Continue lisinopril, lasix, and metoprolol.  # Pulmonary hypertension: Etiology unknown.  She prefers to avoid any further work up given that she is feeling well.  Continue lasix.  #  CV disease prevention: Managed with diet.  Lipids at goal.  Current medicines are  reviewed at length with the patient today.  The patient does not have concerns regarding medicines.  The following changes have been made:  No changes.  Labs/ tests ordered today include:   No orders of the defined types were placed in this encounter.    Disposition:   FU with Dr. Elmarie Shiley C. [place] in 6 months.   Signed, Chilton Si, MD  11/19/2015 2:52 PM    [facility]

## 2015-11-18 NOTE — Patient Instructions (Signed)

## 2015-12-29 ENCOUNTER — Other Ambulatory Visit: Payer: Self-pay | Admitting: Adult Health

## 2015-12-29 DIAGNOSIS — K219 Gastro-esophageal reflux disease without esophagitis: Secondary | ICD-10-CM

## 2015-12-30 NOTE — Telephone Encounter (Signed)
Ok to refill for one year  

## 2015-12-31 ENCOUNTER — Telehealth: Payer: Self-pay | Admitting: Adult Health

## 2015-12-31 ENCOUNTER — Other Ambulatory Visit: Payer: Self-pay

## 2015-12-31 MED ORDER — LISINOPRIL 10 MG PO TABS: ORAL_TABLET | ORAL | 0 refills | Status: DC

## 2015-12-31 NOTE — Telephone Encounter (Signed)
Pt need new Rx for lisinopril   Pharm:  [facility] [address]

## 2016-01-13 ENCOUNTER — Telehealth: Payer: Self-pay | Admitting: Cardiovascular Disease

## 2016-01-13 DIAGNOSIS — Z79899 Other long term (current) drug therapy: Secondary | ICD-10-CM

## 2016-01-13 NOTE — Telephone Encounter (Signed)
Patient very concerned about BP. She said that is has been running unusually high "189".  She needs clarification regarding the dosage she should be taking. Pls. Return call after 1pm.

## 2016-01-13 NOTE — Telephone Encounter (Signed)
Spoke w patient regarding recommendations. She voiced understanding and thanks, will follow this plan. Also noted she was given recommendation to improve hydration, nurse who saw her today assessed skin turgor and made recommendation to increase fluids. Patient will heed this advice as well as 1 time dose increase for lisinopril, she will plan on resuming normal med regimen & call back tomorrow when she gets BP rechecked to get further recommendations. I will plan to follow up w patient.

## 2016-01-13 NOTE — Telephone Encounter (Signed)
Please call and let her know something today if possible.

## 2016-01-13 NOTE — Telephone Encounter (Signed)
Called patient back. She went to get her weekly blood pressure checked at [facility], she lives at [facility] and its was 173/ ? (patient did not know the bottom number). She said she normally goes once a week but has not been in the past few weeks. This is the first time it has been this high. We reviewed her med list and she is taking her meds as prescribed. Denies CP, SOB, Headache, dizziness. She was told by the nurse there that she was dehydrated and needed to drink more fluids. She states she feels just fine and it was just today it was high.  Advised patient to go and have BP rechecked by nurse this afternoon around 2 or so and to have the nurse write down all these readings so that she can call us back with the readings. Talked about that sometimes you may get an isolated high or abnormal reading and we like to see if there is a trend. Encouraged pt to check BP daily over the next week or so and record the readings to look for a trend. She verbalized understanding and will call us back with updated readings.

## 2016-01-13 NOTE — Telephone Encounter (Signed)
Pt is calling to give BP reading: 178/108 this morning 11am 178/92 right now

## 2016-01-13 NOTE — Telephone Encounter (Signed)
May reflect a trend or just be running high today - see notes as discussed. Forwarded to pharmD to review for recommendations.

## 2016-01-13 NOTE — Telephone Encounter (Signed)
F/u Message  Pt  Calling to f/u on telephone not at 11:53am 11/1. Please call back to discuss

## 2016-01-13 NOTE — Telephone Encounter (Signed)
Have her take extra 1/2 of lisinopril tablet tonight, then resume 10 mg daily.  Continue with once daily BP checks.  If continued high over next few days to call back

## 2016-01-14 MED ORDER — LISINOPRIL 10 MG PO TABS
15.0000 mg | ORAL_TABLET | Freq: Every day | ORAL | 1 refills | Status: DC
Start: 2016-01-14 — End: 2016-01-22

## 2016-01-14 NOTE — Telephone Encounter (Signed)
Belenda CruiseKristin, I had patient take extra 1/2 tab of lisinopril yesterday as recommended, and had planned to call her to get update. She called back this AM. BP is still up, just wondering if any further advice at this time or should we have her monitor for a few more days?

## 2016-01-14 NOTE — Telephone Encounter (Signed)
Follow up    Pt c/o BP issue: STAT if pt c/o blurred vision, one-sided weakness or slurred speech  1. What are your last 5 BP readings?   11.1.2017 8am 178/108 11.1.2017 @ 3 pm  178/92 11.1.2017 @ 4:30 pm  171/94  11.2.2017 @ 8 am 158/85  2. Are you having any other symptoms (ex. Dizziness, headache, blurred vision, passed out)? No   3. What is your BP issue? Wants told to call back with blood pressure reading

## 2016-01-14 NOTE — Telephone Encounter (Signed)
Returned call and communicated recommendations to patient. She voiced acknowledgment and understanding of instructions. States she lives [facility] and doesn't know of anyone who gets bloodwork drawn there. She preferred to go to [facility] in our building and I informed her that would be fine and that I can order the bloodwork for there. If she decides to go elsewhere she is aware to inform us so that order can be faxed. Patient will go in about 1 week for BMET. Rx called to [facility] at her request. She is aware to call if new concerns or questions.

## 2016-01-14 NOTE — Telephone Encounter (Signed)
Have her go to 15 mg daily.  Can they draw a BMET at [facility] after 1 week of dose increase?

## 2016-01-21 ENCOUNTER — Telehealth: Payer: Self-pay | Admitting: Cardiovascular Disease

## 2016-01-21 NOTE — Telephone Encounter (Signed)
Addendum: patient also denied headache, blurred vision, SOB, chest pain.

## 2016-01-21 NOTE — Telephone Encounter (Signed)
Mrs. Alyssa Cameron is calling because he blood pressure has been elevated . It was 158/93 on yesterday and the week before it was the same . She faxed over her blood pressure readings on yesterday . Wants to know if Dr. Duke Salviaandolph wants her increase her medication or come in and have any lab work . Please call   Thanks

## 2016-01-21 NOTE — Telephone Encounter (Signed)
Received incoming call from patient. She said she was worried bc BP was 158/93 yesterday. I asked if she had readings from Sunday-Tuesday and she stated she didn't get BP taken those days. Encouraged patient to take BP once a day for the next couple weeks so we can see if the medication adjustment is working and we can look at the trend. Also, reminded patient she needs blood work done today or tomorrow. She said she will get the blood work tomorrow. She verbalized understanding of instructions. Patient will update is with readings.

## 2016-01-21 NOTE — Telephone Encounter (Signed)
No answer. Left message to call back.   

## 2016-01-22 ENCOUNTER — Telehealth: Payer: Self-pay | Admitting: Cardiovascular Disease

## 2016-01-22 NOTE — Telephone Encounter (Signed)
Spoke with patient patient and discussed her readings With her machine reading 160/112 and nurse took and 160/90 Discussed with Aloha GellKelley A Pharm D, will have patient increase Lisinopril to 20 mg daily Advised patient and scheduled ov for Monday, bring blood pressure cuff

## 2016-01-22 NOTE — Telephone Encounter (Signed)
Pt says she is back from lunch,she is waiting to hear something.

## 2016-01-22 NOTE — Telephone Encounter (Signed)
NEw Message  Pt call requesting to speak with RN. Pt states she has labs today and would like to come in and see Dr. Duke Salviaandolph. Pt states her bp has been really high and would like to discuss with Provider. Please call back to discuss   Pt gave bp readings but no exact time  Last week  178/80 178/92 171/94 158/85 Wednesday 11/8 153/91 Wednesday 11/8 158/93 Thursday 11/9 160/90 Thursday 11/9 160/112 Today 11/10 152/108 11/10

## 2016-01-25 ENCOUNTER — Ambulatory Visit (INDEPENDENT_AMBULATORY_CARE_PROVIDER_SITE_OTHER): Payer: Medicare Other | Admitting: Physician Assistant

## 2016-01-25 ENCOUNTER — Encounter: Payer: Self-pay | Admitting: Physician Assistant

## 2016-01-25 VITALS — BP 187/97 | HR 108 | Ht 66.0 in | Wt 175.2 lb

## 2016-01-25 DIAGNOSIS — I482 Chronic atrial fibrillation, unspecified: Secondary | ICD-10-CM

## 2016-01-25 DIAGNOSIS — I272 Pulmonary hypertension, unspecified: Secondary | ICD-10-CM

## 2016-01-25 DIAGNOSIS — I5032 Chronic diastolic (congestive) heart failure: Secondary | ICD-10-CM | POA: Diagnosis not present

## 2016-01-25 DIAGNOSIS — I10 Essential (primary) hypertension: Secondary | ICD-10-CM | POA: Diagnosis not present

## 2016-01-25 MED ORDER — METOPROLOL SUCCINATE ER 100 MG PO TB24: 100.0000 mg | ORAL_TABLET | Freq: Every day | ORAL | 0 refills | Status: DC

## 2016-01-25 MED ORDER — LISINOPRIL 40 MG PO TABS: 40.0000 mg | ORAL_TABLET | Freq: Every day | ORAL | 2 refills | Status: DC

## 2016-01-25 MED ORDER — LISINOPRIL 40 MG PO TABS: 40.0000 mg | ORAL_TABLET | Freq: Every day | ORAL | 0 refills | Status: DC

## 2016-01-25 NOTE — Progress Notes (Signed)
Cardiology Office Note    Date:  01/25/2016   ID:  Alyssa Cameron, DOB Apr 12, 1918, MRN [id number]  PCP:  Shirline Frees, NP  Cardiologist:  Dr. Duke Salvia   Chief Complaint  Patient presents with  . Follow-up    seen for Dr. Duke Salvia    History of Present Illness:  Alyssa Cameron is a 82 y.o. female with PMH of HTN, severe pulm HTN, chronic diastolic HF, and chronic atrial fibrillation. She was first seen in September 2016 after moving to [place] from [place]. She was diagnosed with atrial fibrillation in approximately 2013. She presented with heart failure and was diuresed and started on metoprolol. She had echocardiogram in October 2016 that revealed EF 65-70%, with mild MR, moderate TR, severe pulmonary hypertension with PASP 68 mmHg. Her IVC was dilated and there was of normal respiratory collapse. Her Lasix was increased and her breathing and lower extremity edema improved. She declined further evaluation of her elevated pulmonary pressure, as she was feeling well. Metoprolol was increased in November 2016 due to elevated blood pressure.  She was last seen in office on 11/18/2015 at which time she was doing well. Despite her advanced age, she stays active. Six-month follow-up was recommended. Based on the phone, it appears she has been living in [facility] and recently has been having some issues with her blood pressure running high. Her lisinopril has been increased to 20 mg daily. She brought her blood pressure diary today, her blood pressure has been ranging in the 160s to 200 range in the past 2 weeks. However otherwise she has no significant cardiac symptoms. She denies any shortness of breath, she denies any lower extremity swelling, she denies any chest discomfort. She says she is usually quite relaxed and that there is no stress recently either. She denies any headache, blurred vision or palpitation. She has no cardiac awareness and she stays in atrial fibrillation. There is no obvious  exacerbating factors in this case. Her TSH in September of last year was normal.  Her heart rate in the clinic today is mildly elevated, her blood pressure is in the 180s. I have increased her Toprol-XL to 100 mg daily every morning and also increased her lisinopril to 40 mg daily taken at night. She has been instructed to keep a blood pressure diary with daily blood pressure at 11 AM. I will obtain CBC, basic metabolic panel and TSH. She does have a 1/6 murmur at her apex, I will hold off on obtaining a repeat echocardiogram at this point.    Past Medical History:  Diagnosis Date  . A-fib (HCC)   . Arthritis   . CHF (congestive heart failure) (HCC)   . Diverticulitis   . Frequent UTI   . History of colon polyps   . Hx of colonic polyps    2013 - 2 tubular adenoma sigmoid and Transverse polyp  . Hypertension   . Internal hemorrhoids   . Pulmonary hypertension 01/07/2015  . Spinal stenosis   . Urine incontinence     Past Surgical History:  Procedure Laterality Date  . APPENDECTOMY  1941  . BREAST BIOPSY  2010  . CATARACT EXTRACTION    . GALLBLADDER SURGERY  1970  . TONSILLECTOMY AND ADENOIDECTOMY  1941  . VAGINAL HYSTERECTOMY      Current Medications: Outpatient Medications Prior to Visit  Medication Sig Dispense Refill  . acetaminophen (TYLENOL) 500 MG tablet Take 500-1,000 mg by mouth every 8 (eight) hours as needed for moderate  pain.     Marland Kitchen. CRANBERRY PO Take 1 tablet by mouth daily.    . diphenhydrAMINE (BENADRYL) 25 MG tablet Take 25 mg by mouth at bedtime as needed for sleep.     Marland Kitchen. ELIQUIS 5 MG TABS tablet Take 1 tablet by mouth two  times daily 180 tablet 1  . furosemide (LASIX) 40 MG tablet Take 1 tablet (40 mg total) by mouth daily. 90 tablet 3  . pantoprazole (PROTONIX) 40 MG tablet TAKE 1 TABLET BY MOUTH  DAILY 90 tablet 3  . potassium chloride SA (K-DUR,KLOR-CON) 20 MEQ tablet TAKE 1 TABLET BY MOUTH EVERY DAY 90 tablet 1  . lisinopril (PRINIVIL,ZESTRIL) 10 MG  tablet Take 10 mg by mouth as directed. 2 tablets by mouth daily    . metoprolol succinate (TOPROL-XL) 50 MG 24 hr tablet Take 1 and 1/2 tablet daily. (Patient taking differently: Take 75 mg by mouth daily. ) 135 tablet 3  . Nystatin (NYAMYC) 100000 UNIT/GM POWD Apply as needed (Patient taking differently: Apply 1 g topically daily as needed (rash). Apply as neede) 60 g 3  . furosemide (LASIX) 40 MG tablet Take 1 tablet by mouth  daily 90 tablet 1  . pantoprazole (PROTONIX) 40 MG tablet Take 1 tablet (40 mg total) by mouth daily. 90 tablet 2   No facility-administered medications prior to visit.      Allergies:   Demerol [meperidine] and Vicodin [hydrocodone-acetaminophen]   Social History   Social History  . Marital status: Widowed    Spouse name: N/A  . Number of children: N/A  . Years of education: N/A   Social History Main Topics  . Smoking status: Never Smoker  . Smokeless tobacco: None  . Alcohol use No  . Drug use: No  . Sexual activity: Not Asked   Other Topics Concern  . None   Social History Narrative   Retired from [employer]    Lived in [place] and moved here from [place]   2 daughter and 2 sons   Likes to read and do puzzles     Family History:  The patient's family history includes Heart failure in her father and mother.   ROS:   Please see the history of present illness.    ROS All other systems reviewed and are negative.   PHYSICAL EXAM:   VS:  BP (!) 187/97   Pulse (!) 108   Ht 5\' 6"  (1.676 m)   Wt 175 lb 3.2 oz (79.5 kg)   SpO2 98%   BMI 28.28 kg/m    GEN: Well nourished, well developed, in no acute distress  HEENT: normal  Neck: no JVD, carotid bruits, or masses Cardiac: Irregularly irregular; no rubs, or gallops. No significant lower extremity edema observed. 1/6 murmur at the apex. Respiratory:  clear to auscultation bilaterally, normal work of breathing GI: soft, nontender, nondistended, + BS MS: no deformity or atrophy  Skin: warm and dry, no  rash Neuro:  Alert and Oriented x 3, Strength and sensation are intact Psych: euthymic mood, full affect  Wt Readings from Last 3 Encounters:  01/25/16 175 lb 3.2 oz (79.5 kg)  11/18/15 178 lb 3.2 oz (80.8 kg)  10/06/15 177 lb 1.6 oz (80.3 kg)      Studies/Labs Reviewed:   EKG:  EKG is not ordered today.    Recent Labs: 03/09/2015: Hemoglobin 11.8; Platelets 190 05/19/2015: BUN 24; Creatinine, Ser 1.04; Potassium 4.3; Sodium 140   Lipid Panel    Component Value  Date/Time   CHOL 159 11/21/2014 0820   TRIG 98 11/21/2014 0820   HDL 56 11/21/2014 0820   CHOLHDL 2.8 11/21/2014 0820   VLDL 20 11/21/2014 0820   LDLCALC 83 11/21/2014 0820    Additional studies/ records that were reviewed today include:   Echo 12/18/2014 LV EF: 65% -   70%  - Left ventricle: The cavity size was normal. Posterior wall   thickness was increased in a pattern of mild LVH. Systolic   function was vigorous. The estimated ejection fraction was in the   range of 65% to 70%. Wall motion was normal; there were no   regional wall motion abnormalities. - Mitral valve: There was mild regurgitation. - Left atrium: The atrium was severely dilated. - Right ventricle: The cavity size was normal. Wall thickness was   normal. Systolic function was mildly reduced. - Tricuspid valve: There was moderate regurgitation. Peak RV-RA   gradient (S): 53 mm Hg. - Pulmonary arteries: PA peak pressure: 68 mm Hg (S). - Inferior vena cava: The vessel was dilated. The respirophasic   diameter changes were blunted (< 50%), consistent with elevated   central venous pressure.    ASSESSMENT:    1. Essential hypertension   2. Pulmonary hypertension   3. Chronic diastolic heart failure (HCC)   4. Chronic atrial fibrillation (HCC)      PLAN:  In order of problems listed above:  1. Labile hypertension: Systolic blood pressure ranging in the 160-200 in the last 2 weeks. Unclear cause, patient denies any symptoms except  for mild fatigue. I will obtain a TSH, CBC and basic metabolic panel. It is possible that she is mildly dehydrated, given up titration of lisinopril, I will follow-up closely on her basic metabolic panel. I have increased her lisinopril to 40 mg daily taken every night and her metoprolol succinate to 100 mg daily every morning. She has been instructed to monitor her blood pressure on daily basis and record a blood pressure diary.   2. Pulmonary hypertension: Apparently she has declined any further workup regarding severe pulmonary hypertension in the past.   3. Chronic diastolic heart failure: She is euvolemic on physical exam today.  4. Chronic atrial fibrillation on eliquis 5 mg twice a day. Heart rate is is borderline controlled, increasing Toprol-XL to 100 mg daily should help.    Medication Adjustments/Labs and Tests Ordered: Current medicines are reviewed at length with the patient today.  Concerns regarding medicines are outlined above.  Medication changes, Labs and Tests ordered today are listed in the Patient Instructions below. Patient Instructions  Medication Instructions:  INCREASE Metoprol Succinate to 100mg  Take 1 tablet once a day every morning INCREASE Lisinopril to 40mg  Take 1 tablet every evening   Labwork: Your physician recommends that you return for lab work in: TODAY-TSH, CBC, BMP  Testing/Procedures: None   Follow-Up: Your physician recommends that you schedule a follow-up appointment in: 2 WEEKS WITH Alyssa Maka, PA  Any Other Special Instructions Will Be Listed Below (If Applicable).  KEEP RECORD OF BLOOD PRESSURE READINGS   If you need a refill on your cardiac medications before your next appointment, please call your pharmacy.     Ramond DialSigned, Alyssa Cameron, [place]  01/25/2016 4:32 PM    [facility] [address], [place], [place]  [address] Phone: [phone]) [phone]; Fax: [phone]) [phone]

## 2016-01-25 NOTE — Patient Instructions (Signed)
Medication Instructions:  INCREASE Metoprol Succinate to 100mg  Take 1 tablet once a day every morning INCREASE Lisinopril to 40mg  Take 1 tablet every evening   Labwork: Your physician recommends that you return for lab work in: TODAY-TSH, CBC, BMP  Testing/Procedures: None   Follow-Up: Your physician recommends that you schedule a follow-up appointment in: 2 WEEKS WITH HAO MENG, PA  Any Other Special Instructions Will Be Listed Below (If Applicable).  KEEP RECORD OF BLOOD PRESSURE READINGS   If you need a refill on your cardiac medications before your next appointment, please call your pharmacy.

## 2016-01-26 LAB — CBC
HEMATOCRIT: 38.8 % (ref 35.0–45.0)
HEMOGLOBIN: 12.3 g/dL (ref 11.7–15.5)
MCH: 30.1 pg (ref 27.0–33.0)
MCHC: 31.7 g/dL — ABNORMAL LOW (ref 32.0–36.0)
MCV: 94.9 fL (ref 80.0–100.0)
MPV: 9.8 fL (ref 7.5–12.5)
PLATELETS: 228 10*3/uL (ref 140–400)
RBC: 4.09 MIL/uL (ref 3.80–5.10)
RDW: 14.2 % (ref 11.0–15.0)
WBC: 8.9 10*3/uL (ref 3.8–10.8)

## 2016-01-26 LAB — TSH: TSH: 1.23 m[IU]/L

## 2016-01-26 LAB — BASIC METABOLIC PANEL
BUN: 24 mg/dL (ref 7–25)
CHLORIDE: 102 mmol/L (ref 98–110)
CO2: 25 mmol/L (ref 20–31)
CREATININE: 1.13 mg/dL — AB (ref 0.60–0.88)
Calcium: 9.9 mg/dL (ref 8.6–10.4)
Glucose, Bld: 170 mg/dL — ABNORMAL HIGH (ref 65–99)
POTASSIUM: 5 mmol/L (ref 3.5–5.3)
Sodium: 136 mmol/L (ref 135–146)

## 2016-01-27 ENCOUNTER — Other Ambulatory Visit: Payer: Self-pay

## 2016-01-27 MED ORDER — LISINOPRIL 40 MG PO TABS: 40.0000 mg | ORAL_TABLET | Freq: Every day | ORAL | 3 refills | Status: DC

## 2016-01-28 ENCOUNTER — Other Ambulatory Visit: Payer: Self-pay

## 2016-01-28 MED ORDER — LISINOPRIL 40 MG PO TABS: 40.0000 mg | ORAL_TABLET | Freq: Every day | ORAL | 3 refills | Status: DC

## 2016-01-28 MED ORDER — METOPROLOL SUCCINATE ER 100 MG PO TB24: 100.0000 mg | ORAL_TABLET | Freq: Every day | ORAL | 3 refills | Status: DC

## 2016-02-01 ENCOUNTER — Telehealth: Payer: Self-pay | Admitting: Cardiovascular Disease

## 2016-02-01 MED ORDER — AMLODIPINE BESYLATE 5 MG PO TABS: 5.0000 mg | ORAL_TABLET | Freq: Every day | ORAL | 1 refills | Status: DC

## 2016-02-01 NOTE — Telephone Encounter (Signed)
Reviewed & discussed w Alyssa Cameron who recommended adding amlodipine 5mg  daily, and to follow BPs w goal of 130-140 systolic.  Returned call and spoke to patient, she is aware of recommendations given by provider regarding BP med. She will start in AM and continue daily. She is aware of potential SEs (not limited to but including swelling, lightheadedness on standing, lethargy) & encouraged to call if she notes issues or concerns. She is aware amlodipine has a loading action and may take several days to reach optimal therapy. She should continue to check BPs BID and to take other meds as scheduled.  She will follow up in 1 week for appt as scheduled.

## 2016-02-01 NOTE — Telephone Encounter (Signed)
New message     Pt c/o BP issue: STAT if pt c/o blurred vision, one-sided weakness or slurred speech  1. What are your last 5 BP readings? Today 182/92  Retake 15 min 180/90   2. Are you having any other symptoms (ex. Dizziness, headache, blurred vision, passed out)? Very tired , pressure on top of head   3. What is your BP issue? Wants to discuss with nurse

## 2016-02-01 NOTE — Telephone Encounter (Signed)
Pt still having largely unimproved BP readings at home. She reports following:  14th  - 10am 185/98  4pm 183/95  15th  - 11am 177/91  16th  17th - 11am 160/90   20th -  11am 181/92          3pm 180/90   Requesting advice if possible today before 5.  She is amenable to PA visit sooner than scheduled, is set to return to office on 11/27. Discussed w patient and I informed her I would try to get recommendations today to help w BP since she is having headache. Will discuss w Wynema BirchHao.

## 2016-02-03 ENCOUNTER — Encounter: Payer: Self-pay | Admitting: *Deleted

## 2016-02-08 ENCOUNTER — Ambulatory Visit: Payer: Medicare Other | Admitting: Physician Assistant

## 2016-02-12 ENCOUNTER — Encounter: Payer: Self-pay | Admitting: Physician Assistant

## 2016-02-12 ENCOUNTER — Ambulatory Visit (INDEPENDENT_AMBULATORY_CARE_PROVIDER_SITE_OTHER): Payer: Medicare Other | Admitting: Physician Assistant

## 2016-02-12 VITALS — BP 164/77 | HR 80 | Ht 66.0 in | Wt 171.4 lb

## 2016-02-12 DIAGNOSIS — I10 Essential (primary) hypertension: Secondary | ICD-10-CM | POA: Diagnosis not present

## 2016-02-12 DIAGNOSIS — I482 Chronic atrial fibrillation, unspecified: Secondary | ICD-10-CM

## 2016-02-12 DIAGNOSIS — I272 Pulmonary hypertension, unspecified: Secondary | ICD-10-CM

## 2016-02-12 DIAGNOSIS — I5032 Chronic diastolic (congestive) heart failure: Secondary | ICD-10-CM

## 2016-02-12 MED ORDER — AMLODIPINE BESYLATE 10 MG PO TABS: 10.0000 mg | ORAL_TABLET | Freq: Every day | ORAL | 3 refills | Status: DC

## 2016-02-12 NOTE — Patient Instructions (Signed)
Your physician has recommended you make the following change in your medication:  1.) the amlodipine has been increased from 5 mg to 10 mg daily. A new prescription has been sent to your pharmacy. You can take (2) of the 5 mg tablets daily until completed, then start the 10 mg tablet prescription.   Your physician recommends that you schedule a follow-up appointment in: 3 months with Dr Duke Salviaandolph.

## 2016-02-12 NOTE — Progress Notes (Signed)
Cardiology Office Note    Date:  02/12/2016   ID:  Alyssa Cameron, DOB 11/26/1918, MRN [id number]  PCP:  Shirline Frees, NP  Cardiologist:  Dr. Duke Salvia  Chief Complaint  Patient presents with  . Follow-up    seen for Dr. Duke Salvia, elevated blood pressure    History of Present Illness:  Alyssa Cameron is a 82 y.o. female with PMH of HTN, severe pulm HTN, chronic diastolic HF, and chronic atrial fibrillation. She was first seen in September 2016 after moving to [place] from [place]. She was diagnosed with atrial fibrillation in approximately 2013. She presented with heart failure and was diuresed and started on metoprolol. She had echocardiogram in October 2016 that revealed EF 65-70%, with mild MR, moderate TR, severe pulmonary hypertension with PASP 68 mmHg. Her IVC was dilated and there was of normal respiratory collapse. Her Lasix was increased and her breathing and lower extremity edema improved. She declined further evaluation of her elevated pulmonary pressure, as she was feeling well. Metoprolol was increased in November 2016 due to elevated blood pressure.  She was last seen in office on 11/18/2015 at which time she was doing well. Despite her advanced age, she stays active. Six-month follow-up was recommended. Based on the phone, it appears she has been living in [facility] and recently has been having some issues with her blood pressure running high. Her lisinopril has been increased to 20 mg daily. She brought her blood pressure diary today, her blood pressure has been ranging in the 160s to 200 range in the past 2 weeks. However otherwise she has no significant cardiac symptoms. She denies any shortness of breath, she denies any lower extremity swelling, she denies any chest discomfort. She says she is usually quite relaxed and that there is no stress recently either. She denies any headache, blurred vision or palpitation. She has no cardiac awareness and she stays in atrial fibrillation.  There is no obvious exacerbating factors in this case. Her TSH in September of last year was normal.  I last saw the patient on 01/25/2016, her blood pressure was elevated in the 180s, I did increase her Toprol-XL to 100 mg daily every morning and also increased her lisinopril to 40 mg daily every night. She was instructed to keep a blood pressure diary. She also had 1/6 systolic murmur at her apex, I wanted to hold off on repeat echocardiogram. I did add a 5 mg amlodipine on 02/01/2016 for persistently elevated pressure. She presents today for cardiology evaluation. She has been doing quite well since the last time I saw her, systolic blood pressure today is in the 160s, roughly 20 mmHg lower than her last visit. Based on home blood pressure checks, it appears her systolic blood pressure has been ranging in the 140 to 160s in the last few days, significantly improved compared to before. I will increase her amlodipine to 10 mg daily for better control. Otherwise she appears to be doing quite well, she denies any chest discomfort, shortness of breath. She continues to be involved in social and physical activities in the nursing home.    Past Medical History:  Diagnosis Date  . A-fib (HCC)   . Arthritis   . CHF (congestive heart failure) (HCC)   . Diverticulitis   . Frequent UTI   . History of colon polyps   . Hx of colonic polyps    2013 - 2 tubular adenoma sigmoid and Transverse polyp  . Hypertension   . Internal  hemorrhoids   . Pulmonary hypertension 01/07/2015  . Spinal stenosis   . Urine incontinence     Past Surgical History:  Procedure Laterality Date  . APPENDECTOMY  1941  . BREAST BIOPSY  2010  . CATARACT EXTRACTION    . GALLBLADDER SURGERY  1970  . TONSILLECTOMY AND ADENOIDECTOMY  1941  . VAGINAL HYSTERECTOMY      Current Medications: Outpatient Medications Prior to Visit  Medication Sig Dispense Refill  . acetaminophen (TYLENOL) 500 MG tablet Take 500-1,000 mg by mouth  every 8 (eight) hours as needed for moderate pain.     Marland Kitchen. CRANBERRY PO Take 1 tablet by mouth daily.    . diphenhydrAMINE (BENADRYL) 25 MG tablet Take 25 mg by mouth at bedtime as needed for sleep.     Marland Kitchen. ELIQUIS 5 MG TABS tablet Take 1 tablet by mouth two  times daily 180 tablet 1  . furosemide (LASIX) 40 MG tablet Take 1 tablet (40 mg total) by mouth daily. 90 tablet 3  . lisinopril (PRINIVIL,ZESTRIL) 40 MG tablet Take 1 tablet (40 mg total) by mouth daily at 6 PM. 90 tablet 3  . metoprolol succinate (TOPROL-XL) 100 MG 24 hr tablet Take 1 tablet (100 mg total) by mouth daily with breakfast. 90 tablet 3  . Nystatin (NYAMYC) 100000 UNIT/GM POWD Apply as needed (Patient taking differently: Apply 1 g topically daily as needed (rash). Apply as neede) 60 g 3  . pantoprazole (PROTONIX) 40 MG tablet TAKE 1 TABLET BY MOUTH  DAILY 90 tablet 3  . potassium chloride SA (K-DUR,KLOR-CON) 20 MEQ tablet TAKE 1 TABLET BY MOUTH EVERY DAY 90 tablet 1  . amLODipine (NORVASC) 5 MG tablet Take 1 tablet (5 mg total) by mouth daily. 90 tablet 1   No facility-administered medications prior to visit.      Allergies:   Demerol [meperidine] and Vicodin [hydrocodone-acetaminophen]   Social History   Social History  . Marital status: Widowed    Spouse name: N/A  . Number of children: N/A  . Years of education: N/A   Social History Main Topics  . Smoking status: Never Smoker  . Smokeless tobacco: Never Used  . Alcohol use No  . Drug use: No  . Sexual activity: Not Asked   Other Topics Concern  . None   Social History Narrative   Retired from [employer]    Lived in [place] and moved here from [place]   2 daughter and 2 sons   Likes to read and do puzzles     Family History:  The patient's family history includes Heart failure in her father and mother.   ROS:   Please see the history of present illness.    ROS All other systems reviewed and are negative.   PHYSICAL EXAM:   VS:  BP (!) 164/77   Pulse 80   Ht  5\' 6"  (1.676 m)   Wt 171 lb 6.4 oz (77.7 kg)   BMI 27.66 kg/m    GEN: Well nourished, well developed, in no acute distress  HEENT: normal  Neck: no JVD, carotid bruits, or masses Cardiac: RRR; no rubs, or gallops,no edema  1/6 systolic murmur Respiratory:  clear to auscultation bilaterally, normal work of breathing GI: soft, nontender, nondistended, + BS MS: no deformity or atrophy  Skin: warm and dry, no rash Neuro:  Alert and Oriented x 3, Strength and sensation are intact Psych: euthymic mood, full affect  Wt Readings from Last 3 Encounters:  02/12/16 171  lb 6.4 oz (77.7 kg)  01/25/16 175 lb 3.2 oz (79.5 kg)  11/18/15 178 lb 3.2 oz (80.8 kg)      Studies/Labs Reviewed:   EKG:  EKG is not ordered today.   Recent Labs: 01/25/2016: BUN 24; Creat 1.13; Hemoglobin 12.3; Platelets 228; Potassium 5.0; Sodium 136; TSH 1.23   Lipid Panel    Component Value Date/Time   CHOL 159 11/21/2014 0820   TRIG 98 11/21/2014 0820   HDL 56 11/21/2014 0820   CHOLHDL 2.8 11/21/2014 0820   VLDL 20 11/21/2014 0820   LDLCALC 83 11/21/2014 0820    Additional studies/ records that were reviewed today include:   Echo 12/18/2014 LV EF: 65% - 70%  - Left ventricle: The cavity size was normal. Posterior wall thickness was increased in a pattern of mild LVH. Systolic function was vigorous. The estimated ejection fraction was in the range of 65% to 70%. Wall motion was normal; there were no regional wall motion abnormalities. - Mitral valve: There was mild regurgitation. - Left atrium: The atrium was severely dilated. - Right ventricle: The cavity size was normal. Wall thickness was normal. Systolic function was mildly reduced. - Tricuspid valve: There was moderate regurgitation. Peak RV-RA gradient (S): 53 mm Hg. - Pulmonary arteries: PA peak pressure: 68 mm Hg (S). - Inferior vena cava: The vessel was dilated. The respirophasic diameter changes were blunted (<50%),  consistent with elevated central venous pressure.   ASSESSMENT:    1. Essential hypertension   2. Pulmonary hypertension   3. Chronic diastolic heart failure (HCC)   4. Chronic atrial fibrillation (HCC)      PLAN:  In order of problems listed above:  1. Labile HTN: During the previous office visit, her Toprol-XL was increased to 100 mg daily every morning and the lisinopril was increased to 40 mg every night. Her blood pressure is better controlled compared to the last visit, however still not at goal. I did add 5 mg daily of amlodipine since the last office visit, I will increase the amlodipine to 10 mg daily. She does not have any other symptoms besides the labile high blood pressure. She denies any dizziness, headache, blurred vision of fatigue.  2. Pulmonary hypertension: Apparently she has declined any further workup regarding severe pulmonary hypertension in the past  3. Chronic diastolic heart failure: Euvolemic on physical exam today. She does have venous insufficiency, she raises her legs at night to help with that.  4. Chronic atrial fibrillation on eliquis: rate controlled, Toprol-XL increased to 100 mg daily during the previous office visit. CHA2DS2-Vasc score 5 (female, age, HTN, HF)    Medication Adjustments/Labs and Tests Ordered: Current medicines are reviewed at length with the patient today.  Concerns regarding medicines are outlined above.  Medication changes, Labs and Tests ordered today are listed in the Patient Instructions below. Patient Instructions  Your physician has recommended you make the following change in your medication:  1.) the amlodipine has been increased from 5 mg to 10 mg daily. A new prescription has been sent to your pharmacy. You can take (2) of the 5 mg tablets daily until completed, then start the 10 mg tablet prescription.   Your physician recommends that you schedule a follow-up appointment in: 3 months with Dr Duke Salviaandolph.        Ramond DialSigned, Darryle Dennie, [place]  02/12/2016 6:25 PM    [facility] [address], [place], [place]  [address] Phone: [phone]; Fax: [phone]

## 2016-02-13 ENCOUNTER — Other Ambulatory Visit: Payer: Self-pay | Admitting: Adult Health

## 2016-02-14 ENCOUNTER — Other Ambulatory Visit: Payer: Self-pay | Admitting: Adult Health

## 2016-02-16 NOTE — Telephone Encounter (Signed)
Ok to refill for one year  

## 2016-02-16 NOTE — Telephone Encounter (Signed)
Hi Brooke!  Kandee KeenCory will need to re-fill this.  I only handle Warfarin/Coumadin.

## 2016-03-01 ENCOUNTER — Ambulatory Visit (INDEPENDENT_AMBULATORY_CARE_PROVIDER_SITE_OTHER): Payer: Medicare Other | Admitting: Adult Health

## 2016-03-01 ENCOUNTER — Other Ambulatory Visit: Payer: Self-pay | Admitting: Physician Assistant

## 2016-03-01 ENCOUNTER — Encounter: Payer: Self-pay | Admitting: Adult Health

## 2016-03-01 VITALS — BP 138/60 | HR 105 | Temp 98.0°F | Ht 66.0 in | Wt 179.2 lb

## 2016-03-01 DIAGNOSIS — R600 Localized edema: Secondary | ICD-10-CM | POA: Diagnosis not present

## 2016-03-01 NOTE — Progress Notes (Signed)
Subjective:    Patient ID: Alyssa Cameron, female    DOB: 03/26/1918, 82 y.o.   MRN: [id number]  HPI  82 year old female who  has a past medical history of A-fib (HCC); Arthritis; CHF (congestive heart failure) (HCC); Diverticulitis; Frequent UTI; History of colon polyps; colonic polyps; Hypertension; Internal hemorrhoids; Pulmonary hypertension (01/07/2015); Spinal stenosis; and Urine incontinence.  She presents to the office today for an acute complaint of worsening lower extremity swelling around her ankles. She feels as though the Lasix she takes is not working as well as it once did.   She was seen by cardiology about three weeks ago  and her Amlodipine was increased from 5 mg to 10 mg  She denies any chest pain or SOB.   Her blood pressure has been well controlled since starting 10 mg   Review of Systems  Constitutional: Negative.   Respiratory: Negative.   Cardiovascular: Positive for leg swelling.  Musculoskeletal: Negative.   All other systems reviewed and are negative.  Past Medical History:  Diagnosis Date  . A-fib (HCC)   . Arthritis   . CHF (congestive heart failure) (HCC)   . Diverticulitis   . Frequent UTI   . History of colon polyps   . Hx of colonic polyps    2013 - 2 tubular adenoma sigmoid and Transverse polyp  . Hypertension   . Internal hemorrhoids   . Pulmonary hypertension 01/07/2015  . Spinal stenosis   . Urine incontinence     Social History   Social History  . Marital status: Widowed    Spouse name: N/A  . Number of children: N/A  . Years of education: N/A   Occupational History  . Not on file.   Social History Main Topics  . Smoking status: Never Smoker  . Smokeless tobacco: Never Used  . Alcohol use No  . Drug use: No  . Sexual activity: Not on file   Other Topics Concern  . Not on file   Social History Narrative   Retired from [employer]    Lived in [place] and moved here from [place]   2 daughter and 2 sons   Likes to read and do  puzzles    Past Surgical History:  Procedure Laterality Date  . APPENDECTOMY  1941  . BREAST BIOPSY  2010  . CATARACT EXTRACTION    . GALLBLADDER SURGERY  1970  . TONSILLECTOMY AND ADENOIDECTOMY  1941  . VAGINAL HYSTERECTOMY      Family History  Problem Relation Age of Onset  . Heart failure Mother     7364  . Heart failure Father     1275  . Breast cancer    . Lung cancer    . Stroke      Allergies  Allergen Reactions  . Demerol [Meperidine] Nausea Only  . Vicodin [Hydrocodone-Acetaminophen] Nausea And Vomiting    Current Outpatient Prescriptions on File Prior to Visit  Medication Sig Dispense Refill  . acetaminophen (TYLENOL) 500 MG tablet Take 500-1,000 mg by mouth every 8 (eight) hours as needed for moderate pain.     Marland Kitchen. amLODipine (NORVASC) 10 MG tablet Take 1 tablet (10 mg total) by mouth daily. 90 tablet 3  . CRANBERRY PO Take 1 tablet by mouth daily.    . diphenhydrAMINE (BENADRYL) 25 MG tablet Take 25 mg by mouth at bedtime as needed for sleep.     Marland Kitchen. ELIQUIS 5 MG TABS tablet TAKE 1 TABLET BY MOUTH TWO  TIMES DAILY 60 tablet 11  . furosemide (LASIX) 40 MG tablet TAKE 1 TABLET BY MOUTH  DAILY 90 tablet 1  . lisinopril (PRINIVIL,ZESTRIL) 40 MG tablet Take 1 tablet (40 mg total) by mouth daily at 6 PM. 90 tablet 3  . metoprolol succinate (TOPROL-XL) 100 MG 24 hr tablet Take 1 tablet (100 mg total) by mouth daily with breakfast. 90 tablet 3  . Nystatin (NYAMYC) 100000 UNIT/GM POWD Apply as needed (Patient taking differently: Apply 1 g topically daily as needed (rash). Apply as neede) 60 g 3  . pantoprazole (PROTONIX) 40 MG tablet TAKE 1 TABLET BY MOUTH  DAILY 90 tablet 3  . potassium chloride SA (K-DUR,KLOR-CON) 20 MEQ tablet TAKE 1 TABLET BY MOUTH EVERY DAY 90 tablet 1   No current facility-administered medications on file prior to visit.     BP 138/60 (BP Location: Left Arm, Patient Position: Sitting, Cuff Size: Normal)   Pulse (!) 105   Temp 98 F (36.7 C) (Oral)    Ht 5\' 6"  (1.676 m)   Wt 179 lb 3.2 oz (81.3 kg)   SpO2 99%   BMI 28.92 kg/m        Objective:   Physical Exam  Constitutional: She is oriented to person, place, and time. She appears well-developed and well-nourished. No distress.  Cardiovascular: Normal rate, regular rhythm, normal heart sounds and intact distal pulses.  Exam reveals no gallop and no friction rub.   No murmur heard. Pulmonary/Chest: Effort normal and breath sounds normal. No respiratory distress. She has no wheezes. She has no rales. She exhibits no tenderness.  Musculoskeletal: She exhibits edema.  +1 non pitting edema on bilateral lower extremities  Neurological: She is alert and oriented to person, place, and time. She has normal reflexes.  Skin: Skin is warm and dry. No rash noted. She is not diaphoretic. No erythema. No pallor.  Psychiatric: She has a normal mood and affect. Her behavior is normal. Judgment and thought content normal.  Nursing note and vitals reviewed.     Assessment & Plan:  1. Lower extremity edema - possibly due to amlodipine increase. Will have her revert back to 5 mg. Elevate legs.  - Will follow up with in two days   Shirline Freesory Issac Moure, NP

## 2016-03-01 NOTE — Telephone Encounter (Signed)
Review for refill. 

## 2016-03-01 NOTE — Progress Notes (Signed)
Pre visit review using our clinic review tool, if applicable. No additional management support is needed unless otherwise documented below in the visit note. 

## 2016-03-03 ENCOUNTER — Other Ambulatory Visit: Payer: Self-pay | Admitting: Emergency Medicine

## 2016-03-04 ENCOUNTER — Telehealth: Payer: Self-pay | Admitting: Adult Health

## 2016-03-04 NOTE — Telephone Encounter (Signed)
Left message for patient to return phone call.  

## 2016-03-04 NOTE — Telephone Encounter (Signed)
FYI

## 2016-03-04 NOTE — Telephone Encounter (Signed)
Pt states she was to call cory about her bp. Pt sys it is good, except her legs are all swollen. Would like a call back.

## 2016-03-04 NOTE — Telephone Encounter (Signed)
Pts daughter is calling to give the Bp and weight for today 128/74 177 and she wants to know if she should continue with the medication.  You can contact the daughter Aram BeechamCynthia at [phone].

## 2016-03-04 NOTE — Telephone Encounter (Signed)
I am glad her BP has been fine. I would continue to elevate legs and can use compression socks. She can also take an extra 20 mg of lasix every other day for three doses

## 2016-03-04 NOTE — Telephone Encounter (Signed)
Patient spoke with me and Kandee KeenCory and verbalized understanding.

## 2016-04-09 ENCOUNTER — Other Ambulatory Visit: Payer: Self-pay | Admitting: Adult Health

## 2016-04-11 NOTE — Telephone Encounter (Signed)
Ok to refill for one year  

## 2016-04-26 ENCOUNTER — Ambulatory Visit (INDEPENDENT_AMBULATORY_CARE_PROVIDER_SITE_OTHER): Payer: Medicare Other | Admitting: Adult Health

## 2016-04-26 ENCOUNTER — Encounter: Payer: Self-pay | Admitting: Adult Health

## 2016-04-26 ENCOUNTER — Ambulatory Visit: Payer: Medicare Other | Admitting: Adult Health

## 2016-04-26 VITALS — BP 128/78 | Temp 97.6°F | Ht 66.0 in | Wt 174.4 lb

## 2016-04-26 DIAGNOSIS — S99921A Unspecified injury of right foot, initial encounter: Secondary | ICD-10-CM | POA: Diagnosis not present

## 2016-04-26 MED ORDER — CEPHALEXIN 500 MG PO CAPS: 500.0000 mg | ORAL_CAPSULE | Freq: Three times a day (TID) | ORAL | 0 refills | Status: DC

## 2016-04-26 NOTE — Progress Notes (Signed)
Subjective:    Patient ID: Alyssa BranchRita Cameron, female    DOB: [birth year], 83 y.o.   MRN: [id number]  HPI  83 year old active female who  has a past medical history of A-fib (HCC); Arthritis; CHF (congestive heart failure) (HCC); Diverticulitis; Frequent UTI; History of colon polyps; colonic polyps; Hypertension; Internal hemorrhoids; Pulmonary hypertension (01/07/2015); Spinal stenosis; and Urine incontinence.  Presents with redness, warmth and pain to right great toe after trying to clip her toe nails. She reports that while she was clipping it she caused tissue injury which appears to have caused an infection. She denies any drainage. Is painful when pressure is applied.    Review of Systems See HPI  Past Medical History:  Diagnosis Date  . A-fib (HCC)   . Arthritis   . CHF (congestive heart failure) (HCC)   . Diverticulitis   . Frequent UTI   . History of colon polyps   . Hx of colonic polyps    2013 - 2 tubular adenoma sigmoid and Transverse polyp  . Hypertension   . Internal hemorrhoids   . Pulmonary hypertension 01/07/2015  . Spinal stenosis   . Urine incontinence     Social History   Social History  . Marital status: Widowed    Spouse name: N/A  . Number of children: N/A  . Years of education: N/A   Occupational History  . Not on file.   Social History Main Topics  . Smoking status: Never Smoker  . Smokeless tobacco: Never Used  . Alcohol use No  . Drug use: No  . Sexual activity: Not on file   Other Topics Concern  . Not on file   Social History Narrative   Retired from [employer]    Lived in [place] and moved here from [place]   2 daughter and 2 sons   Likes to read and do puzzles    Past Surgical History:  Procedure Laterality Date  . APPENDECTOMY  1941  . BREAST BIOPSY  2010  . CATARACT EXTRACTION    . GALLBLADDER SURGERY  1970  . TONSILLECTOMY AND ADENOIDECTOMY  1941  . VAGINAL HYSTERECTOMY      Family History  Problem Relation Age of Onset  . Heart  failure Mother     5964  . Heart failure Father     6675  . Breast cancer    . Lung cancer    . Stroke      Allergies  Allergen Reactions  . Demerol [Meperidine] Nausea Only  . Vicodin [Hydrocodone-Acetaminophen] Nausea And Vomiting    Current Outpatient Prescriptions on File Prior to Visit  Medication Sig Dispense Refill  . acetaminophen (TYLENOL) 500 MG tablet Take 500-1,000 mg by mouth every 8 (eight) hours as needed for moderate pain.     Marland Kitchen. amLODipine (NORVASC) 10 MG tablet Take 1 tablet (10 mg total) by mouth daily. 90 tablet 3  . CRANBERRY PO Take 1 tablet by mouth daily.    . diphenhydrAMINE (BENADRYL) 25 MG tablet Take 25 mg by mouth at bedtime as needed for sleep.     Marland Kitchen. ELIQUIS 5 MG TABS tablet TAKE 1 TABLET BY MOUTH TWO  TIMES DAILY 60 tablet 11  . furosemide (LASIX) 40 MG tablet TAKE 1 TABLET BY MOUTH  DAILY 90 tablet 1  . lisinopril (PRINIVIL,ZESTRIL) 40 MG tablet Take 1 tablet (40 mg total) by mouth daily at 6 PM. 90 tablet 3  . metoprolol succinate (TOPROL-XL) 100 MG 24 hr tablet Take  1 tablet (100 mg total) by mouth daily with breakfast. 90 tablet 3  . Nystatin (NYAMYC) 100000 UNIT/GM POWD Apply as needed (Patient taking differently: Apply 1 g topically daily as needed (rash). Apply as neede) 60 g 3  . pantoprazole (PROTONIX) 40 MG tablet TAKE 1 TABLET BY MOUTH  DAILY 90 tablet 3  . potassium chloride SA (K-DUR,KLOR-CON) 20 MEQ tablet TAKE 1 TABLET BY MOUTH  EVERY DAY 90 tablet 1   No current facility-administered medications on file prior to visit.     BP 128/78   Temp 97.6 F (36.4 C) (Oral)   Ht 5\' 6"  (1.676 m)   Wt 174 lb 6.4 oz (79.1 kg)   BMI 28.15 kg/m       Objective:   Physical Exam  Constitutional: She is oriented to person, place, and time. She appears well-developed and well-nourished. No distress.  Cardiovascular: Normal rate, regular rhythm, normal heart sounds and intact distal pulses.  Exam reveals no gallop and no friction rub.   No murmur  heard. Pulmonary/Chest: Effort normal and breath sounds normal. No respiratory distress. She has no wheezes. She has no rales. She exhibits no tenderness.  Neurological: She is alert and oriented to person, place, and time.  Skin: Skin is warm and dry. No rash noted. She is not diaphoretic. There is erythema. No pallor.  Redness, warmth, swelling and pain located around right great toe. No drainage noted, no Paronychia  Psychiatric: She has a normal mood and affect. Her behavior is normal. Judgment and thought content normal.  Nursing note and vitals reviewed.     Assessment & Plan:  1. Injury of right great toe, initial encounter - Ambulatory referral to Podiatry for toe nail clipping - cephALEXin (KEFLEX) 500 MG capsule; Take 1 capsule (500 mg total) by mouth 3 (three) times daily.  Dispense: 30 capsule; Refill: 0 - Follow up if no improvement  Shirline Frees, NP

## 2016-05-13 ENCOUNTER — Ambulatory Visit (INDEPENDENT_AMBULATORY_CARE_PROVIDER_SITE_OTHER): Payer: Medicare Other | Admitting: Cardiovascular Disease

## 2016-05-13 ENCOUNTER — Encounter: Payer: Self-pay | Admitting: Cardiovascular Disease

## 2016-05-13 VITALS — BP 134/74 | HR 79 | Ht 66.0 in | Wt 173.0 lb

## 2016-05-13 DIAGNOSIS — I482 Chronic atrial fibrillation, unspecified: Secondary | ICD-10-CM

## 2016-05-13 DIAGNOSIS — Z5181 Encounter for therapeutic drug level monitoring: Secondary | ICD-10-CM

## 2016-05-13 DIAGNOSIS — I272 Pulmonary hypertension, unspecified: Secondary | ICD-10-CM | POA: Diagnosis not present

## 2016-05-13 DIAGNOSIS — I5032 Chronic diastolic (congestive) heart failure: Secondary | ICD-10-CM | POA: Diagnosis not present

## 2016-05-13 DIAGNOSIS — I110 Hypertensive heart disease with heart failure: Secondary | ICD-10-CM | POA: Diagnosis not present

## 2016-05-13 DIAGNOSIS — I10 Essential (primary) hypertension: Secondary | ICD-10-CM | POA: Diagnosis not present

## 2016-05-13 NOTE — Progress Notes (Signed)
Cardiology Office Note   Date:  05/13/2016   ID:  Alyssa Cameron, DOB January 09, 1919, MRN [id number]  PCP:  Shirline Frees, NP  Cardiologist:   Chilton Si, MD   Chief Complaint  Patient presents with  . Follow-up     History of Present Illness:  Alyssa Cameron is a 83 y.o. female with hypertension, severe pulmonary hypertension, heart failure, and chronic atrial fibrillation here for follow up.  Alyssa Cameron was first seen 11/2014 after moving to [facility] from [place].  She was diagnosed with atrial fibrillation in approximately 2013.  She presented with heart failure, was diuresed and started on metoprolol.  She had an echo 12/2014 that revealed LVEF 65-70% with mild MR, moderate TR and severe pulmonary hypertension (PASP 68 mmHg).  Her IVC was dilated and there was abnormal respiratory collapse.  Her lasix was increased and her breathing and lower extremity edema improved.  She declined further evaluation of her elevated pulmonary pressures, as she was feeling well.  Metoprolol was increased 01/2015 due to elevated blood pressure.  Since then she continues to do well.  Her blood pressure is usually around 127/70.  She has not noted any lower extremity edema, orthopnea or PND.  She does not get much formal exercise but she is very active.  She lives in a retirement community but spends a lot of time with her daughter as well.  Since her last appointment Alyssa Cameron saw Shirline Frees, NP on 02/2016 due to increased lower extremity edema.  Prior to this amlodipine had been increased from 5 mg to 10 mg.  This was reduced back to 5 mg at that appointment.  Since then the edema has improved and her BP has been mostly in the 120s-150s systolic.  She has been feeling well and denies chest pain or shortness of breath.   Past Medical History:  Diagnosis Date  . A-fib (HCC)   . Arthritis   . CHF (congestive heart failure) (HCC)   . Diverticulitis   . Frequent UTI   . History of colon polyps   . Hx of colonic polyps    2013 - 2 tubular adenoma sigmoid and Transverse polyp  . Hypertension   . Internal hemorrhoids   . Pulmonary hypertension 01/07/2015  . Spinal stenosis   . Urine incontinence     Past Surgical History:  Procedure Laterality Date  . APPENDECTOMY  1941  . BREAST BIOPSY  2010  . CATARACT EXTRACTION    . GALLBLADDER SURGERY  1970  . TONSILLECTOMY AND ADENOIDECTOMY  1941  . VAGINAL HYSTERECTOMY       Current Outpatient Prescriptions  Medication Sig Dispense Refill  . acetaminophen (TYLENOL) 500 MG tablet Take 500-1,000 mg by mouth every 8 (eight) hours as needed for moderate pain.     Marland Kitchen amLODipine (NORVASC) 10 MG tablet Take 1 tablet (10 mg total) by mouth daily. (Patient taking differently: Take 5 mg by mouth daily. ) 90 tablet 3  . CRANBERRY PO Take 1 tablet by mouth daily.    . diphenhydrAMINE (BENADRYL) 25 MG tablet Take 25 mg by mouth at bedtime as needed for sleep.     Marland Kitchen ELIQUIS 5 MG TABS tablet TAKE 1 TABLET BY MOUTH TWO  TIMES DAILY 60 tablet 11  . furosemide (LASIX) 40 MG tablet TAKE 1 TABLET BY MOUTH  DAILY 90 tablet 1  . lisinopril (PRINIVIL,ZESTRIL) 40 MG tablet Take 1 tablet (40 mg total) by mouth daily at 6 PM. 90 tablet 3  .  metoprolol succinate (TOPROL-XL) 100 MG 24 hr tablet Take 1 tablet (100 mg total) by mouth daily with breakfast. 90 tablet 3  . pantoprazole (PROTONIX) 40 MG tablet TAKE 1 TABLET BY MOUTH  DAILY 90 tablet 3  . potassium chloride SA (K-DUR,KLOR-CON) 20 MEQ tablet TAKE 1 TABLET BY MOUTH  EVERY DAY 90 tablet 1   No current facility-administered medications for this visit.     Allergies:   Demerol [meperidine] and Vicodin [hydrocodone-acetaminophen]    Social History:  The patient  reports that she has never smoked. She has never used smokeless tobacco. She reports that she does not drink alcohol or use drugs.   Family History:  The patient's family history includes Heart failure in her father and mother.    ROS:  Please see the history of present  illness.  All other systems are reviewed and were positive for knee pain due to arthritis.  Otherwise review of systems was negative.    PHYSICAL EXAM: VS:  BP 134/74   Pulse 79   Ht 5\' 6"  (1.676 m)   Wt 78.5 kg (173 lb)   BMI 27.92 kg/m  , BMI Body mass index is 27.92 kg/m. GENERAL:  Well appearing HEENT:  Pupils equal round and reactive, fundi not visualized, oral mucosa unremarkable NECK:  No JVD, waveform within normal limits, carotid upstroke brisk and symmetric, no bruits LYMPHATICS:  No cervical adenopathy LUNGS:  Clear to auscultation bilaterally HEART:  Irregularly irregular.  PMI not displaced or sustained,S1 and S2 within normal limits, no S3, no S4, no clicks, no rubs, III/VI holosystolic murmur at the apex.  II/VI systolic murmur at the LUSB. ABD:  Flat, positive bowel sounds normal in frequency in pitch, no bruits, no rebound, no guarding, no midline pulsatile mass, no hepatomegaly, no splenomegaly EXT:  2 plus pulses throughout, no edema, no cyanosis no clubbing SKIN:  No rashes no nodules NEURO:  Cranial nerves II through XII grossly intact, motor grossly intact throughout PSYCH:  Cognitively intact, oriented to person place and time   EKG:  EKG is ordered today. 11/18/15: Atrial fibrillation.  Rate 95 bpm.  Inferolateral T wave inversions.  The ekg ordered 11/17/14 demonstrates atrial fibrillation rate 98 bpm.  Low voltage precordial leads.   05/13/16: Atrial fibrillation.  Rate 79 bpm.   Recent Labs: 01/25/2016: BUN 24; Creat 1.13; Hemoglobin 12.3; Platelets 228; Potassium 5.0; Sodium 136; TSH 1.23    Lipid Panel    Component Value Date/Time   CHOL 159 11/21/2014 0820   TRIG 98 11/21/2014 0820   HDL 56 11/21/2014 0820   CHOLHDL 2.8 11/21/2014 0820   VLDL 20 11/21/2014 0820   LDLCALC 83 11/21/2014 0820      Wt Readings from Last 3 Encounters:  05/13/16 78.5 kg (173 lb)  04/26/16 79.1 kg (174 lb 6.4 oz)  03/01/16 81.3 kg (179 lb 3.2 oz)      Other  studies Reviewed: Additional studies/ records that were reviewed today include:  Review of the above records demonstrates:  Please see elsewhere in the note.   ASSESSMENT AND PLAN:  # Chronic Atrial fibrillation: Rate controlled and patient tolerating metoprolol and apixaban.    # Hypertension: BP has been labile.  She did not tolerate higher doses of amlodipine due to swelling.  We would have to either add another agent or switch metoprolol to carvedilol. She prefers to continue with current management.  Given how well she is doing, Riley Nearingthi s is reasonable.  Continue amlodipine, lisinopril,  and metoprolol.  # Pulmonary hypertension: Etiology unknown.  She prefers to avoid any further work up given that she is feeling well.  Continue lasix.  #  CV disease prevention: Managed with diet.  No repeat lipids due to age.  Current medicines are reviewed at length with the patient today.  The patient does not have concerns regarding medicines.  The following changes have been made:  No changes.  Labs/ tests ordered today include:   Orders Placed This Encounter  Procedures  . CBC with Differential/Platelet  . Basic metabolic panel     Disposition:   FU with Dr. Elmarie Shiley C. [place] in 6 months.   Signed, Chilton Si, MD  05/13/2016 9:16 PM    [facility]

## 2016-05-13 NOTE — Patient Instructions (Signed)
Medication Instructions:  Your physician recommends that you continue on your current medications as directed. Please refer to the Current Medication list given to you today.  Labwork: CBC/BMET AT [facility] AT [address] 1ST FLOOR OR AT PRIMARY CARE IN 3 MONTHS   Testing/Procedures: NONE  Follow-Up: Your physician wants you to follow-up in: 6 MONTH OV You will receive a reminder letter in the mail two months in advance. If you don't receive a letter, please call our office to schedule the follow-up appointment.  If you need a refill on your cardiac medications before your next appointment, please call your pharmacy.

## 2016-05-16 NOTE — Addendum Note (Signed)
Addended by: Regis BillPRATT, Aloise Copus B on: 05/16/2016 09:46 AM   Modules accepted: Orders

## 2016-05-25 ENCOUNTER — Ambulatory Visit (INDEPENDENT_AMBULATORY_CARE_PROVIDER_SITE_OTHER): Payer: Medicare Other | Admitting: Podiatry

## 2016-05-25 ENCOUNTER — Encounter: Payer: Self-pay | Admitting: Podiatry

## 2016-05-25 VITALS — BP 140/72 | HR 78 | Resp 16 | Ht 66.0 in | Wt 173.0 lb

## 2016-05-25 DIAGNOSIS — L608 Other nail disorders: Secondary | ICD-10-CM | POA: Diagnosis not present

## 2016-05-25 NOTE — Patient Instructions (Signed)
Today your foot examination demonstrated palpable pulses and decreased feeling If you wanted you could go to a pedicurist to have your nails trimmed. Do not: Whirlpool. Do not manipulate cuticles

## 2016-05-25 NOTE — Progress Notes (Signed)
   Subjective:    Patient ID: Alyssa Cameron, female    DOB: 1919-02-07, 83 y.o.   MRN: [id number]  HPI   This patient presents today with her daughter Treatment. The patient states that she is not able to physically trim her toenails any further as well as visual impairment also makes this difficult. She describes an infection that was treated with oral antibiotics the last several weeks after patient apparently cut herself while attempting to trim the toenails. Still difficulty is gradually progressed slowly over the past year. Patient denies any professional care.  Review of Systems  Genitourinary: Positive for frequency.  All other systems reviewed and are negative.      Objective:   Physical Exam  Orientated 3  Vascular: DP pulses 2/4 bilaterally PT pulses 1/4 bilaterally Capillary reflex within normal limits bilaterally  Neurological: Sensation to 10 g monofilament wire intact 1/5 right and 4/5 left Vibratory sensation nonreactive bilaterally Ankle reflexes trace reactive bilaterally  Dermatological: No open skin lesions bilaterally Atrophic skin bilaterally The toenails are incurvated, elongated with occasional texture and color changes 6-10  Musculoskeletal: Patient walks slowly with assistance of walker Manual motor testing dorsi flexion, plantar flexion 5/5 bilaterally HAV left Hammertoe second left       Assessment & Plan:   Assessment: Peripheral neuropathy Incurvated toenails 6-10  Plan: Today I reviewed the results exam with patient and patient's daughter and offered them debridement of these nails and he verbally consent. Toenails 6-10 are debrided mechanically and electrically without any bleeding At discharge also discussed the option of a pedicurist avoiding whirlpool and manipulation of cuticles. Pain is requesting podiatric care  Reappoint 3 months

## 2016-06-06 ENCOUNTER — Other Ambulatory Visit: Payer: Self-pay

## 2016-06-06 MED ORDER — AMLODIPINE BESYLATE 10 MG PO TABS: 10.0000 mg | ORAL_TABLET | Freq: Every day | ORAL | 3 refills | Status: DC

## 2016-06-24 ENCOUNTER — Encounter: Payer: Self-pay | Admitting: Adult Health

## 2016-06-24 ENCOUNTER — Ambulatory Visit (INDEPENDENT_AMBULATORY_CARE_PROVIDER_SITE_OTHER): Payer: Medicare Other | Admitting: Adult Health

## 2016-06-24 VITALS — BP 130/72 | Temp 98.1°F | Ht 66.0 in | Wt 172.0 lb

## 2016-06-24 DIAGNOSIS — H1032 Unspecified acute conjunctivitis, left eye: Secondary | ICD-10-CM

## 2016-06-24 DIAGNOSIS — Z760 Encounter for issue of repeat prescription: Secondary | ICD-10-CM

## 2016-06-24 MED ORDER — ERYTHROMYCIN 5 MG/GM OP OINT: TOPICAL_OINTMENT | OPHTHALMIC | 0 refills | Status: DC

## 2016-06-24 MED ORDER — AMLODIPINE BESYLATE 5 MG PO TABS: 5.0000 mg | ORAL_TABLET | Freq: Every day | ORAL | 3 refills | Status: DC

## 2016-06-24 NOTE — Progress Notes (Signed)
Subjective:    Patient ID: Alyssa Cameron, female    DOB: Dec 29, 1918, 83 y.o.   MRN: [id number]  Conjunctivitis   The current episode started 5 to 7 days ago. The onset was gradual. Associated symptoms include eye itching, eye discharge and eye redness. Pertinent negatives include no fever, no decreased vision, no double vision, no photophobia and no eye pain.      Review of Systems  Constitutional: Negative.  Negative for fever.  Eyes: Positive for discharge, redness and itching. Negative for double vision, photophobia, pain and visual disturbance.   Past Medical History:  Diagnosis Date  . A-fib (HCC)   . Arthritis   . CHF (congestive heart failure) (HCC)   . Diverticulitis   . Frequent UTI   . History of colon polyps   . Hx of colonic polyps    2013 - 2 tubular adenoma sigmoid and Transverse polyp  . Hypertension   . Internal hemorrhoids   . Pulmonary hypertension 01/07/2015  . Spinal stenosis   . Urine incontinence     Social History   Social History  . Marital status: Widowed    Spouse name: N/A  . Number of children: N/A  . Years of education: N/A   Occupational History  . Not on file.   Social History Main Topics  . Smoking status: Never Smoker  . Smokeless tobacco: Never Used  . Alcohol use No  . Drug use: No  . Sexual activity: Not on file   Other Topics Concern  . Not on file   Social History Narrative   Retired from [employer] in [place] and moved here from [place]   2 daughter and 2 sons   Likes to read and do puzzles    Past Surgical History:  Procedure Laterality Date  . APPENDECTOMY  1941  . BREAST BIOPSY  2010  . CATARACT EXTRACTION    . GALLBLADDER SURGERY  1970  . TONSILLECTOMY AND ADENOIDECTOMY  1941  . VAGINAL HYSTERECTOMY      Family History  Problem Relation Age of Onset  . Heart failure Mother     69  . Heart failure Father     90  . Breast cancer    . Lung cancer    . Stroke      Allergies  Allergen Reactions  .  Demerol [Meperidine] Nausea Only  . Vicodin [Hydrocodone-Acetaminophen] Nausea And Vomiting    Current Outpatient Prescriptions on File Prior to Visit  Medication Sig Dispense Refill  . acetaminophen (TYLENOL) 500 MG tablet Take 500-1,000 mg by mouth every 8 (eight) hours as needed for moderate pain.     Marland Kitchen amLODipine (NORVASC) 10 MG tablet Take 1 tablet (10 mg total) by mouth daily. 90 tablet 3  . CRANBERRY PO Take 1 tablet by mouth daily.    . diphenhydrAMINE (BENADRYL) 25 MG tablet Take 25 mg by mouth at bedtime as needed for sleep.     Marland Kitchen ELIQUIS 5 MG TABS tablet TAKE 1 TABLET BY MOUTH TWO  TIMES DAILY 60 tablet 11  . furosemide (LASIX) 40 MG tablet TAKE 1 TABLET BY MOUTH  DAILY 90 tablet 1  . lisinopril (PRINIVIL,ZESTRIL) 40 MG tablet Take 1 tablet (40 mg total) by mouth daily at 6 PM. 90 tablet 3  . metoprolol succinate (TOPROL-XL) 100 MG 24 hr tablet Take 1 tablet (100 mg total) by mouth daily with breakfast. 90 tablet 3  . pantoprazole (PROTONIX) 40 MG tablet TAKE 1  TABLET BY MOUTH  DAILY 90 tablet 3  . potassium chloride SA (K-DUR,KLOR-CON) 20 MEQ tablet TAKE 1 TABLET BY MOUTH  EVERY DAY 90 tablet 1   No current facility-administered medications on file prior to visit.     BP 130/72 (BP Location: Left Arm, Patient Position: Sitting, Cuff Size: Normal)   Temp 98.1 F (36.7 C) (Oral)   Ht  (1.676 m)   Wt 172 lb (78 kg)   BMI 27.76 kg/m       Objective:   Physical Exam  Constitutional: She appears well-developed and well-nourished. No distress.  Eyes: Pupils are equal, round, and reactive to light. Lids are everted and swept, no foreign bodies found. Right eye exhibits no discharge. Left eye exhibits discharge (purulent ). Right conjunctiva is not injected. Left conjunctiva is injected.  Skin: She is not diaphoretic.  Nursing note and vitals reviewed.     Assessment & Plan:  1. Acute bacterial conjunctivitis of left eye - Exam consistent with bacterial conjunctivitis    - erythromycin (ROMYCIN) ophthalmic ointment; Apply thin ribbon to affected eye(s) once daily for 7 days.  Dispense: 1 g; Refill: 0  2. Medication refill  - amLODipine (NORVASC) 5 MG tablet; Take 1 tablet (5 mg total) by mouth daily.  Dispense: 90 tablet; Refill: 3  Shirline Frees, NP

## 2016-07-08 ENCOUNTER — Telehealth: Payer: Self-pay | Admitting: Adult Health

## 2016-07-08 NOTE — Telephone Encounter (Signed)
She should still not being having symptoms...   What symptoms is she having?

## 2016-07-08 NOTE — Telephone Encounter (Signed)
I left message for patient to return phone call.   

## 2016-07-08 NOTE — Telephone Encounter (Signed)
Please advise 

## 2016-07-08 NOTE — Telephone Encounter (Signed)
Pt would like to see if Kandee Keen would refill erythromycin ophthalmic ointment she still have a little pink in her eye. Pharm:  [facility] [address]

## 2016-07-09 ENCOUNTER — Other Ambulatory Visit: Payer: Self-pay | Admitting: Adult Health

## 2016-07-09 DIAGNOSIS — H1032 Unspecified acute conjunctivitis, left eye: Secondary | ICD-10-CM

## 2016-07-12 NOTE — Telephone Encounter (Signed)
Per Kandee Keen - spoke with patient's daughter and patient does not need these medications.

## 2016-07-12 NOTE — Telephone Encounter (Signed)
Refuse 

## 2016-07-12 NOTE — Telephone Encounter (Signed)
No longer needs

## 2016-07-21 ENCOUNTER — Other Ambulatory Visit: Payer: Self-pay | Admitting: Adult Health

## 2016-07-21 NOTE — Telephone Encounter (Signed)
Ok to refill for one year  

## 2016-08-05 ENCOUNTER — Other Ambulatory Visit: Payer: Self-pay

## 2016-08-05 ENCOUNTER — Telehealth: Payer: Self-pay | Admitting: Adult Health

## 2016-08-05 DIAGNOSIS — K219 Gastro-esophageal reflux disease without esophagitis: Secondary | ICD-10-CM

## 2016-08-05 MED ORDER — PANTOPRAZOLE SODIUM 40 MG PO TBEC: 40.0000 mg | DELAYED_RELEASE_TABLET | Freq: Every day | ORAL | 0 refills | Status: DC

## 2016-08-05 NOTE — Telephone Encounter (Signed)
10 day supply has been sent to local pharmacy.

## 2016-08-05 NOTE — Telephone Encounter (Signed)
Pt is out of pantoprazole (PROTONIX) 40 MG tablet  Pt has not [facility] her [facility] yet and wants to know if Kandee KeenCory will send in a 10 day supply to:  [id number] [place],  - [address]

## 2016-08-17 ENCOUNTER — Other Ambulatory Visit: Payer: Self-pay | Admitting: Adult Health

## 2016-08-17 DIAGNOSIS — K219 Gastro-esophageal reflux disease without esophagitis: Secondary | ICD-10-CM

## 2016-08-24 ENCOUNTER — Ambulatory Visit (INDEPENDENT_AMBULATORY_CARE_PROVIDER_SITE_OTHER): Payer: Medicare Other | Admitting: Podiatry

## 2016-08-24 ENCOUNTER — Encounter: Payer: Self-pay | Admitting: Podiatry

## 2016-08-24 DIAGNOSIS — G609 Hereditary and idiopathic neuropathy, unspecified: Secondary | ICD-10-CM | POA: Diagnosis not present

## 2016-08-24 DIAGNOSIS — L608 Other nail disorders: Secondary | ICD-10-CM | POA: Diagnosis not present

## 2016-08-25 NOTE — Progress Notes (Signed)
Patient ID: Alyssa Cameron, female   DOB: June 16, 1918, 83 y.o.   MRN: [id number]   Subjective: This patient presents today with her daughter present in the treatment room. Patient's daughter is requesting debridement of her mother's toenails. Patient and patient's daughter unable to trim the toenails themselves.  Objective:  Orientated 3  Vascular: DP pulses 2/4 bilaterally PT pulses 1/4 bilaterally Capillary reflex within normal limits bilaterally  Neurological: Sensation to 10 g monofilament wire intact 1/5 right and 4/5 left Vibratory sensation nonreactive bilaterally Ankle reflexes trace reactive bilaterally  Dermatological: No open skin lesions bilaterally Atrophic skin bilaterally The toenails are incurvated, elongated with occasional texture and color changes 6-10  Musculoskeletal: Patient walks slowly with assistance of walker Manual motor testing dorsi flexion, plantar flexion 5/5 bilaterally HAV left Hammertoe second left  Assessment: Peripheral neuropathy Incurvated toenails 6-10  Plan: Debrided incurvated toenails 6-10 mechanically an electrical without any bleeding  Reappoint 3 month

## 2016-11-07 ENCOUNTER — Telehealth: Payer: Self-pay | Admitting: Cardiovascular Disease

## 2016-11-07 NOTE — Telephone Encounter (Signed)
Left message for pt to call.

## 2016-11-07 NOTE — Telephone Encounter (Signed)
New message  Pt call requesting to speak with RN about getting orders for lab work to be completed. Please call back to discuss

## 2016-11-09 NOTE — Telephone Encounter (Signed)
Returned call to daughter-advised that she can get labs drawn the day of appointment.   Daughter aware and verbalized understanding.

## 2016-11-09 NOTE — Telephone Encounter (Signed)
Follow up      Pt has an appt with Dr Duke Salviaandolph on 11-16-16.  She is due to have fasting lab prior to appt.  It is difficult getting her to and from appts.  Calling to see if pt can have labs drawn during appt with Dr Duke Salviaandolph and does she have to be fasting?

## 2016-11-15 ENCOUNTER — Ambulatory Visit: Payer: Medicare Other | Admitting: Cardiovascular Disease

## 2016-11-16 ENCOUNTER — Encounter: Payer: Self-pay | Admitting: Cardiovascular Disease

## 2016-11-16 ENCOUNTER — Ambulatory Visit (INDEPENDENT_AMBULATORY_CARE_PROVIDER_SITE_OTHER): Payer: Medicare Other | Admitting: Cardiovascular Disease

## 2016-11-16 VITALS — BP 143/70 | HR 72 | Wt 181.8 lb

## 2016-11-16 DIAGNOSIS — I272 Pulmonary hypertension, unspecified: Secondary | ICD-10-CM | POA: Diagnosis not present

## 2016-11-16 DIAGNOSIS — I482 Chronic atrial fibrillation, unspecified: Secondary | ICD-10-CM

## 2016-11-16 DIAGNOSIS — I10 Essential (primary) hypertension: Secondary | ICD-10-CM | POA: Diagnosis not present

## 2016-11-16 DIAGNOSIS — I5033 Acute on chronic diastolic (congestive) heart failure: Secondary | ICD-10-CM | POA: Diagnosis not present

## 2016-11-16 NOTE — Patient Instructions (Addendum)
Medication Instructions:  INCREASE YOUR FUROSEMIDE TO 40 MG TWICE A DAY FOR 2 DAYS AND THEN RESUME DAILY   Labwork: NONE  Testing/Procedures: NONE  Follow-Up: Your physician recommends that you schedule a follow-up appointment in: 2 MONTH OV  Any Other Special Instructions Will Be Listed Below (If Applicable). YOU MAY TAKE AN EXTRAS FUROSEMIDE AS NEEDED FOR WEIGHT GAIN OF 2 POUNDS IN 24 HOURS OR 5 POUNDS IN 7 DAYS  If you need a refill on your cardiac medications before your next appointment, please call your pharmacy.

## 2016-11-16 NOTE — Progress Notes (Signed)
Cardiology Office Note   Date:  11/16/2016   ID:  Alyssa Cameron, DOB 1918/09/04, MRN [id number]  PCP:  Shirline Frees, NP  Cardiologist:   Chilton Si, MD   Chief Complaint  Patient presents with  . Follow-up  . Edema    legs and ankles.      History of Present Illness:  Alyssa Cameron is a 83 y.o. female with hypertension, severe pulmonary hypertension, heart failure, and chronic atrial fibrillation here for follow up.  Alyssa Cameron was first seen 11/2014 after moving to [facility] from [place].  She was diagnosed with atrial fibrillation in approximately 2013.  She presented with heart failure, was diuresed and started on metoprolol.  She had an echo 12/2014 that revealed LVEF 65-70% with mild MR, moderate TR and severe pulmonary hypertension (PASP 68 mmHg).  Her IVC was dilated and there was abnormal respiratory collapse.  Her lasix was increased and her breathing and lower extremity edema improved.  She declined further evaluation of her elevated pulmonary pressures, as she was feeling well.  Metoprolol was increased 01/2015 due to elevated blood pressure.  Since then she continues to do well.  Her blood pressure is usually around 127/70.  She has not noted any lower extremity edema, orthopnea or PND.  She does not get much formal exercise but she is very active.  She lives in a retirement community but spends a lot of time with her daughter as well.  Since her last appointment Alyssa Cameron has been feeling well.  She went on a trip to the beach two weeks ago.  Since then she has noted increased lower extremity edema.  She denies orthopnea or PND.  She hasn't noted any chest pain or shortness of breath, but she doesn't get much formal exercise.  She walks on a walker and is able to go shopping do most of her ADLs.  She has very rare palpitations, usually when lying on her L side.  She notes that she has been very tired in the mornings lately, but she attributes that to not sleeping well at night   Past Medical  History:  Diagnosis Date  . A-fib (HCC)   . Arthritis   . CHF (congestive heart failure) (HCC)   . Diverticulitis   . Frequent UTI   . History of colon polyps   . Hx of colonic polyps    2013 - 2 tubular adenoma sigmoid and Transverse polyp  . Hypertension   . Internal hemorrhoids   . Pulmonary hypertension (HCC) 01/07/2015  . Spinal stenosis   . Urine incontinence     Past Surgical History:  Procedure Laterality Date  . APPENDECTOMY  1941  . BREAST BIOPSY  2010  . CATARACT EXTRACTION    . GALLBLADDER SURGERY  1970  . TONSILLECTOMY AND ADENOIDECTOMY  1941  . VAGINAL HYSTERECTOMY       Current Outpatient Prescriptions  Medication Sig Dispense Refill  . acetaminophen (TYLENOL) 500 MG tablet Take 500-1,000 mg by mouth every 8 (eight) hours as needed for moderate pain.     Marland Kitchen amLODipine (NORVASC) 5 MG tablet Take 1 tablet (5 mg total) by mouth daily. 90 tablet 3  . CRANBERRY PO Take 1 tablet by mouth daily.    . diphenhydrAMINE (BENADRYL) 25 MG tablet Take 25 mg by mouth at bedtime as needed for sleep.     Marland Kitchen ELIQUIS 5 MG TABS tablet TAKE 1 TABLET BY MOUTH TWO  TIMES DAILY 60 tablet 11  .  erythromycin [facility](ROMYCIN) ophthalmic ointment Apply thin ribbon to affected eye(s) once daily for 7 days. 1 g 0  . furosemide (LASIX) 40 MG tablet Take 40 mg by mouth daily. OK TO TAKE AN EXTRA TABLET AS NEEDED FOR WEIGHT GAIN    . lisinopril (PRINIVIL,ZESTRIL) 40 MG tablet Take 1 tablet (40 mg total) by mouth daily at 6 PM. 90 tablet 3  . metoprolol succinate (TOPROL-XL) 100 MG 24 hr tablet Take 1 tablet (100 mg total) by mouth daily with breakfast. 90 tablet 3  . pantoprazole (PROTONIX) 40 MG tablet TAKE 1 TABLET BY MOUTH EVERY DAY 30 tablet 0  . potassium chloride SA (K-DUR,KLOR-CON) 20 MEQ tablet TAKE 1 TABLET BY MOUTH  EVERY DAY 90 tablet 1   No current facility-administered medications for this visit.     Allergies:   Demerol [meperidine] and Vicodin [hydrocodone-acetaminophen]    Social  History:  The patient  reports that she has never smoked. She has never used smokeless tobacco. She reports that she does not drink alcohol or use drugs.   Family History:  The patient's family history includes Breast cancer in her unknown relative; Heart failure in her father and mother; Lung cancer in her unknown relative; Stroke in her unknown relative.    ROS:  Please see the history of present illness.  All other systems are reviewed and were positive for knee pain due to arthritis.  Otherwise review of systems was negative.    PHYSICAL EXAM: VS:  BP (!) 143/70   Pulse 72   Wt 82.5 kg (181 lb 12.8 oz)   BMI 29.34 kg/m  , BMI Body mass index is 29.34 kg/m. GENERAL:  Well appearing HEENT:  Pupils equal round and reactive, fundi not visualized, oral mucosa unremarkable NECK:  JVP 2cm above clavicle sitting upright.  Waveform within normal limits, carotid upstroke brisk and symmetric, no bruits LUNGS:  Clear to auscultation bilaterally.  No crackles, wheezes or rhonchi HEART:  Irregularly irregular.  PMI not displaced or sustained,S1 and S2 within normal limits, no S3, no S4, no clicks, no rubs, III/VI holosystolic murmur at the apex.  II/VI systolic murmur at the LUSB. ABD:  Flat, positive bowel sounds normal in frequency in pitch, no bruits, no rebound, no guarding, no midline pulsatile mass, no hepatomegaly, no splenomegaly EXT:  2 plus pulses throughout, 2+ pitting edema to mid tibia bilaterally, no cyanosis no clubbing SKIN:  No rashes no nodules NEURO:  Cranial nerves II through XII grossly intact, motor grossly intact throughout PSYCH:  Cognitively intact, oriented to person place and time   EKG:  EKG is ordered today. 11/18/15: Atrial fibrillation.  Rate 95 bpm.  Inferolateral T wave inversions.  The ekg ordered 11/17/14 demonstrates atrial fibrillation rate 98 bpm.  Low voltage precordial leads.   05/13/16: Atrial fibrillation.  Rate 79 bpm. 11/16/16: Atrial fibrillation.  Rate 72  bpm. Low voltage.    Recent Labs: 01/25/2016: BUN 24; Creat 1.13; Hemoglobin 12.3; Platelets 228; Potassium 5.0; Sodium 136; TSH 1.23    Lipid Panel    Component Value Date/Time   CHOL 159 11/21/2014 0820   TRIG 98 11/21/2014 0820   HDL 56 11/21/2014 0820   CHOLHDL 2.8 11/21/2014 0820   VLDL 20 11/21/2014 0820   LDLCALC 83 11/21/2014 0820      Wt Readings from Last 3 Encounters:  11/16/16 82.5 kg (181 lb 12.8 oz)  06/24/16 78 kg (172 lb)  05/25/16 78.5 kg (173 lb)  Other studies Reviewed: Additional studies/ records that were reviewed today include:  Review of the above records demonstrates:  Please see elsewhere in the note.   ASSESSMENT AND PLAN:  # Chronic atrial fibrillation: Rate controlled and patient tolerating metoprolol and apixaban.    # Hypertension: BP has been labile.  She did not tolerate higher doses of amlodipine due to swelling.  Continue amlodipine, lisinopril, and metoprolol.  # Pulmonary hypertension: # Acute on chronic diastolic heart failure: Increase lasix to 40mg  bid x2 days then 40mg  daily.  Okay to take an extra dose for weight gain >2lb in one day of 5lb in one week. Etiology unknown.  She prefers to avoid any further work up given that she is feeling well.  Continue lasix.  #  CV disease prevention: Managed with diet.  No repeat lipids due to age.  Current medicines are reviewed at length with the patient today.  The patient does not have concerns regarding medicines.  The following changes have been made:  Increase lasix to 40mg  bid x2 days.  Labs/ tests ordered today include:   No orders of the defined types were placed in this encounter.    Disposition:   FU with Dr. Elmarie Shiley C. [facility] [place] in 2 months.   Signed, Chilton Si, MD  11/16/2016 10:17 PM    [facility]

## 2016-11-17 LAB — BASIC METABOLIC PANEL
BUN/Creatinine Ratio: 25 (ref 12–28)
BUN: 31 mg/dL (ref 10–36)
CALCIUM: 9.7 mg/dL (ref 8.7–10.3)
CO2: 20 mmol/L (ref 20–29)
CREATININE: 1.22 mg/dL — AB (ref 0.57–1.00)
Chloride: 105 mmol/L (ref 96–106)
GFR calc Af Amer: 44 mL/min/{1.73_m2} — ABNORMAL LOW (ref >59)
GFR, EST NON AFRICAN AMERICAN: 38 mL/min/{1.73_m2} — AB (ref >59)
Glucose: 114 mg/dL — ABNORMAL HIGH (ref 65–99)
Potassium: 4.9 mmol/L (ref 3.5–5.2)
Sodium: 142 mmol/L (ref 134–144)

## 2016-11-17 LAB — CBC WITH DIFFERENTIAL/PLATELET
Basophils Absolute: 0.1 10*3/uL (ref 0.0–0.2)
Basos: 1 %
EOS (ABSOLUTE): 0.1 10*3/uL (ref 0.0–0.4)
EOS: 2 %
HEMATOCRIT: 31.5 % — AB (ref 34.0–46.6)
HEMOGLOBIN: 10 g/dL — AB (ref 11.1–15.9)
IMMATURE GRANS (ABS): 0 10*3/uL (ref 0.0–0.1)
IMMATURE GRANULOCYTES: 0 %
LYMPHS ABS: 1 10*3/uL (ref 0.7–3.1)
Lymphs: 18 %
MCH: 29.8 pg (ref 26.6–33.0)
MCHC: 31.7 g/dL (ref 31.5–35.7)
MCV: 94 fL (ref 79–97)
MONOCYTES: 10 %
Monocytes Absolute: 0.6 10*3/uL (ref 0.1–0.9)
Neutrophils Absolute: 4 10*3/uL (ref 1.4–7.0)
Neutrophils: 69 %
Platelets: 182 10*3/uL (ref 150–379)
RBC: 3.36 x10E6/uL — AB (ref 3.77–5.28)
RDW: 14.9 % (ref 12.3–15.4)
WBC: 5.8 10*3/uL (ref 3.4–10.8)

## 2016-11-22 ENCOUNTER — Telehealth: Payer: Self-pay | Admitting: Cardiovascular Disease

## 2016-11-22 NOTE — Telephone Encounter (Signed)
Returned call to patient, patient reports she was seen last week and her weight was 181 lbs.  Reports her weight is up to 184 and she was wondering what she needed to do.   States she took the Lasix 40 BID x 2 days but is back to taking 40 daily, has not taken extra.    Reports minor LE edema, denies SOB.   Reports she is monitoring salt and fluid intake.   Took 40mg  lasix this AM at 8am but has not taken any additional as she wanted to call and speak to our office first.  Per chart review: patient was instructed to take additional lasix as needed for weight gain of 2 lbs in 24 hours or 5 lbs in 1 week.    Advised to take extra 40mg  lasix today and would seen additional instructions from Dr. Duke Salviaandolph.    Patient agreed and verbalized understanding.

## 2016-11-22 NOTE — Telephone Encounter (Signed)
New Message  Pt call requesting to speak with RN about her fluid levels. Pt states since her appt  Her levels have been ranging 184. Please call back to discuss

## 2016-11-23 ENCOUNTER — Ambulatory Visit: Payer: Medicare Other | Admitting: Podiatry

## 2016-11-29 NOTE — Telephone Encounter (Signed)
Left message to call back  

## 2016-11-29 NOTE — Telephone Encounter (Signed)
Is her weight back to baseline after taking the extra lasix?

## 2016-11-30 NOTE — Telephone Encounter (Signed)
Spoke with patient and she sates her weight was 181 today but she recently went on vacation and thinks weight gain secondary. Stated she had no changes in her breathing and her legs look better. Will forward to Dr Duke Salvia for review

## 2016-12-02 ENCOUNTER — Encounter: Payer: Self-pay | Admitting: Adult Health

## 2016-12-29 ENCOUNTER — Telehealth: Payer: Self-pay | Admitting: Cardiovascular Disease

## 2016-12-29 ENCOUNTER — Encounter: Payer: Self-pay | Admitting: Cardiovascular Disease

## 2016-12-29 NOTE — Telephone Encounter (Signed)
If she has increased edema and her weight is up that much I recommend she take an extra lasix for 3 days.

## 2016-12-29 NOTE — Telephone Encounter (Signed)
New message     Pt c/o swelling: STAT is pt has developed SOB within 24 hours  1) How much weight have you gained and in what time span? No  2) If swelling, where is the swelling located? legs  3) Are you currently taking a fluid pill? Yes and she states she is already doubling up on them   4) Are you currently SOB?  no  5) Do you have a log of your daily weights (if so, list)?  no  6) Have you gained 3 pounds in a day or 5 pounds in a week? no  7) Have you traveled recently?  no

## 2016-12-29 NOTE — Telephone Encounter (Signed)
This encounter was created in error - please disregard.

## 2016-12-29 NOTE — Telephone Encounter (Signed)
Left message to call back  

## 2016-12-29 NOTE — Telephone Encounter (Signed)
Advised patient, verbalized understanding  

## 2016-12-29 NOTE — Telephone Encounter (Signed)
Mrs. Alyssa Cameron is returning your call . Juliette Alcide(Melinda) Thanks

## 2016-12-29 NOTE — Telephone Encounter (Signed)
Returned call to patient of Dr. Duke Salviaandolph. She c/o leg edema for a few weeks. She reports her base weight is 181lbs and she has been consistently around 186-187lbs. She denies shortness of breath. She takes lasix 40mg  daily and extra tablet daily as needed for weight - she has been taking extra lasix only about once a week. Advised her to call our office for weight gain of >3lbs in 24 hours or 5lbs in 1 week. Advised to use extra PRN lasix. She has PAOV on 10/22 @ 1130.  Labs 9/20 BUN 10 - 36 mg/dL 31   Creatinine, Ser 1.610.57 - 1.00 mg/dL 0.961.22     Routed to MD as Lorain ChildesFYI

## 2017-01-02 ENCOUNTER — Encounter: Payer: Self-pay | Admitting: Cardiovascular Disease

## 2017-01-02 ENCOUNTER — Encounter: Payer: Self-pay | Admitting: Physician Assistant

## 2017-01-02 ENCOUNTER — Ambulatory Visit (INDEPENDENT_AMBULATORY_CARE_PROVIDER_SITE_OTHER): Payer: Medicare Other | Admitting: Physician Assistant

## 2017-01-02 VITALS — BP 137/83 | HR 82 | Ht 66.0 in | Wt 191.8 lb

## 2017-01-02 DIAGNOSIS — I482 Chronic atrial fibrillation, unspecified: Secondary | ICD-10-CM

## 2017-01-02 DIAGNOSIS — I10 Essential (primary) hypertension: Secondary | ICD-10-CM | POA: Diagnosis not present

## 2017-01-02 DIAGNOSIS — I5033 Acute on chronic diastolic (congestive) heart failure: Secondary | ICD-10-CM | POA: Diagnosis not present

## 2017-01-02 MED ORDER — LISINOPRIL 20 MG PO TABS: 20.0000 mg | ORAL_TABLET | Freq: Every day | ORAL | 3 refills | Status: DC

## 2017-01-02 MED ORDER — METOLAZONE 2.5 MG PO TABS: 2.5000 mg | ORAL_TABLET | Freq: Every day | ORAL | 1 refills | Status: DC

## 2017-01-02 MED ORDER — FUROSEMIDE 20 MG PO TABS: 20.0000 mg | ORAL_TABLET | Freq: Every day | ORAL | 3 refills | Status: DC

## 2017-01-02 NOTE — Progress Notes (Signed)
Cardiology Office Note   Date:  01/02/2017   ID:  Alyssa Cameron Santor, DOB 1919/02/13, MRN [id number]  PCP:  Shirline FreesNafziger, Cory, NP  Cardiologist:  Dr. Duke Salviaandolph, 11/16/2016  Theodore DemarkBarrett, Jeramey Lanuza, PA-C    History of Present Illness: Alyssa Cameron Mceachin is a 83 y.o. female with a history of HTN, severe PAH, D-CHF, chronic afib, CHADS2VASC= 5 (age x 2, female, HTN, CHF) on Eliquis  Alyssa Cameron Homen presents for cardiology follow up and evaluation. She is here today with her daughter. She is an independent living facility.  Sx began about 5 weeks ago. She was at the beach and was eating more food, more intrinsic salt in foods than usual. She noticed increased LE edema, increasing weight.   She contacted the office and took an extra Lasix tab on Friday.   Her scales have continued to show 187. She denies increased DOE but says she is walking more slowly. Her knees have been bothering her more. She swelling is going above her knees. Of note, her respiratory rate increased with conversation.  She is not waking up with SOB. Denies orthopnea, sleeps on 1 pillow. She notes that her edema is going above the knees now which it has not done previously.  No chest pain, no palpitations. Does not check her blood pressure much at home.   Past Medical History:  Diagnosis Date  . A-fib (HCC)   . Arthritis   . CHF (congestive heart failure) (HCC)   . Diverticulitis   . Frequent UTI   . History of colon polyps   . Hx of colonic polyps    2013 - 2 tubular adenoma sigmoid and Transverse polyp  . Hypertension   . Internal hemorrhoids   . Pulmonary hypertension (HCC) 01/07/2015  . Spinal stenosis   . Urine incontinence     Past Surgical History:  Procedure Laterality Date  . APPENDECTOMY  1941  . BREAST BIOPSY  2010  . CATARACT EXTRACTION    . GALLBLADDER SURGERY  1970  . TONSILLECTOMY AND ADENOIDECTOMY  1941  . VAGINAL HYSTERECTOMY      Current Outpatient Prescriptions  Medication Sig Dispense Refill  .  acetaminophen (TYLENOL) 500 MG tablet Take 500-1,000 mg by mouth every 8 (eight) hours as needed for moderate pain.     Marland Kitchen. amLODipine (NORVASC) 5 MG tablet Take 5 mg by mouth daily.    Marland Kitchen. CRANBERRY PO Take 1 tablet by mouth daily.    . diphenhydrAMINE (BENADRYL) 25 MG tablet Take 25 mg by mouth at bedtime as needed for sleep.     Marland Kitchen. ELIQUIS 5 MG TABS tablet TAKE 1 TABLET BY MOUTH TWO  TIMES DAILY 60 tablet 11  . furosemide (LASIX) 40 MG tablet Take 40 mg by mouth daily. OK TO TAKE AN EXTRA TABLET AS NEEDED FOR WEIGHT GAIN    . lisinopril (PRINIVIL,ZESTRIL) 40 MG tablet Take 1 tablet (40 mg total) by mouth daily at 6 PM. 90 tablet 3  . metoprolol succinate (TOPROL-XL) 100 MG 24 hr tablet Take 1 tablet (100 mg total) by mouth daily with breakfast. 90 tablet 3  . pantoprazole (PROTONIX) 40 MG tablet TAKE 1 TABLET BY MOUTH EVERY DAY 30 tablet 0  . potassium chloride SA (K-DUR,KLOR-CON) 20 MEQ tablet TAKE 1 TABLET BY MOUTH  EVERY DAY 90 tablet 1   No current facility-administered medications for this visit.     Allergies:   Demerol [meperidine] and Vicodin [hydrocodone-acetaminophen]    Social History:  The patient  reports that  she has never smoked. She has never used smokeless tobacco. She reports that she does not drink alcohol or use drugs.   Family History:  The patient's family history includes Breast cancer in her unknown relative; Heart failure in her father and mother; Lung cancer in her unknown relative; Stroke in her unknown relative.    ROS:  Please see the history of present illness. All other systems are reviewed and negative.    PHYSICAL EXAM: VS:  BP 137/83   Pulse 82   Ht 5\' 6"  (1.676 m)   Wt 191 lb 12.8 oz (87 kg)   SpO2 100%   BMI 30.96 kg/m  , BMI Body mass index is 30.96 kg/m. GEN: Well nourished, well developed, female in no acute distress  HEENT: normal for age  Neck: JVD 10 cm, no carotid bruit, no masses Cardiac: Irreg R&R; + murmur, no rubs, or  gallops Respiratory: decreased BS bases bilaterally, normal work of breathing GI: soft, nontender, nondistended, + BS MS: no deformity or atrophy; 2-3+ bilateral LE edema to knees w/ a little above the knees; distal pulses are 2+ in all 4 extremities   Skin: warm and dry, no rash Neuro:  Strength and sensation are intact Psych: euthymic mood, full affect   EKG:  EKG is not ordered today.  ECHO: 12/18/2014 - Left ventricle: The cavity size was normal. Posterior wall   thickness was increased in a pattern of mild LVH. Systolic   function was vigorous. The estimated ejection fraction was in the   range of 65% to 70%. Wall motion was normal; there were no   regional wall motion abnormalities. - Mitral valve: There was mild regurgitation. - Left atrium: The atrium was severely dilated. - Right ventricle: The cavity size was normal. Wall thickness was   normal. Systolic function was mildly reduced. - Tricuspid valve: There was moderate regurgitation. Peak RV-RA   gradient (S): 53 mm Hg. - Pulmonary arteries: PA peak pressure: 68 mm Hg (S). - Inferior vena cava: The vessel was dilated. The respirophasic   diameter changes were blunted (< 50%), consistent with elevated   central venous pressure.  Recent Labs: 01/25/2016: TSH 1.23 11/16/2016: BUN 31; Creatinine, Ser 1.22; Hemoglobin 10.0; Platelets 182; Potassium 4.9; Sodium 142    Lipid Panel    Component Value Date/Time   CHOL 159 11/21/2014 0820   TRIG 98 11/21/2014 0820   HDL 56 11/21/2014 0820   CHOLHDL 2.8 11/21/2014 0820   VLDL 20 11/21/2014 0820   LDLCALC 83 11/21/2014 0820     Wt Readings from Last 3 Encounters:  01/02/17 191 lb 12.8 oz (87 kg)  11/16/16 181 lb 12.8 oz (82.5 kg)  06/24/16 172 lb (78 kg)     Other studies Reviewed: Additional studies/ records that were reviewed today include: Office notes, hospital records and testing.  ASSESSMENT AND PLAN:  1.  Acute on chronic diastolic CHF: Her weight is not up  on her scales but it is up on ours. I will try temporarily increasing her Lasix and adding metolazone for 2 days. Metolazone can also be used as needed if she gains weight.   She is encouraged to track her weight daily and make sure she is recording. She is to take the metolazone 30 minutes before the Lasix. We will get a BMET today and she will have early follow-up in the office with a BMET at that time.   2. Hypertension: Her blood pressure is above target today, but she  is volume overloaded. I we will decrease the lisinopril.  3. CKD III: Her GFR was 38 in September and that is her approximate baseline. We'll decrease the lisinopril from 40 mg daily to 20 g daily. Of note, her potassium has been high normal and this will have to be followed closely.  4. Chronic atrial fibrillation: Her heart rate is normal today. Continue current therapies.  Current medicines are reviewed at length with the patient today.  The patient does not have concerns regarding medicines.  The following changes have been made:  Increase Lasix, add metolazone  Labs/ tests ordered today include:   Orders Placed This Encounter  Procedures  . Basic metabolic panel     Disposition:   FU with Dr. Duke Salvia  Signed, Theodore Demark, PA-C  01/02/2017 4:34 PM    [facility] Phone: [phone]; Fax: [phone]  This note was written with the assistance of speech recognition software. Please excuse any transcriptional errors.

## 2017-01-02 NOTE — Patient Instructions (Addendum)
Medication Instructions:  INCREASE LASIX 40MG  TWICE DAILY FOR 3 DAYS THEN DAILY  TAKE METOLAZONE 2.5MG  FOR 2 DAYS (TUESDAY AND WEDNESDAY)30 MINUTES BEFORE LASIX  THEN OK UP TO 3 TIMES WEEKLY THEREAFTER, FOR WEIGHT GAIN OF >3 POUNDS IN A DAY OR > 5 POUNDS IN A WEEK.  START LISINOPRIL 20MG  DAILY If you need a refill on your cardiac medications before your next appointment, please call your pharmacy.  Labwork: BMET TODAY HERE IN OUR OFFICE AT [facility]  Special Instructions: START 2,000MG  LOW SODIUM DIET  CALL IF YOU HAVE LEG CRAMPING  2 LITRES DAILY FLUID RESTRICTION  Follow-Up: Your physician wants you to follow-up in: 1-2 WEEKS WITH RHONDA OR PA.   Thank you for choosing [facility]!!

## 2017-01-03 ENCOUNTER — Telehealth: Payer: Self-pay | Admitting: Cardiovascular Disease

## 2017-01-03 LAB — BASIC METABOLIC PANEL
BUN/Creatinine Ratio: 28 (ref 12–28)
BUN: 40 mg/dL — ABNORMAL HIGH (ref 10–36)
CALCIUM: 9.7 mg/dL (ref 8.7–10.3)
CHLORIDE: 101 mmol/L (ref 96–106)
CO2: 22 mmol/L (ref 20–29)
Creatinine, Ser: 1.41 mg/dL — ABNORMAL HIGH (ref 0.57–1.00)
GFR calc non Af Amer: 32 mL/min/{1.73_m2} — ABNORMAL LOW (ref >59)
GFR, EST AFRICAN AMERICAN: 37 mL/min/{1.73_m2} — AB (ref >59)
GLUCOSE: 138 mg/dL — AB (ref 65–99)
POTASSIUM: 4.8 mmol/L (ref 3.5–5.2)
Sodium: 140 mmol/L (ref 134–144)

## 2017-01-03 NOTE — Telephone Encounter (Signed)
Spoke with daughter and clarified medications from yesterday's visit. Per daughter mother has been taking Furosemide 40 mg daily. Advised that the increase was just for a few days and then was to resume 40 mg daily as she had been doing. She also had questions regarding the compression stockings. Answered questions on when to put on. Daughter appreciated call back

## 2017-01-03 NOTE — Telephone Encounter (Signed)
Please call daughter, Alyssa Cameron regarding an email that was sent to Dr. Duke Cameron last evening thru My Chart.  Mrs. Alyssa CapriceSullivan has multiple questions that were not answered yesterday during her mother's visit.   Patient prefers not to see Alyssa Demarkhonda Barrett, PA. In follow up.

## 2017-01-04 ENCOUNTER — Telehealth: Payer: Self-pay | Admitting: *Deleted

## 2017-01-04 NOTE — Telephone Encounter (Signed)
Left msg to call.

## 2017-01-04 NOTE — Telephone Encounter (Signed)
S/w daughter, identified recommendations to reduce metolazone dosing, advised next week to take this med 2 times, Monday and Thursday, continue other medications as prescribed. Advised to call if concerns at any point before next follow up. She verbalized understanding and thanks.

## 2017-01-04 NOTE — Telephone Encounter (Signed)
Follow up ° ° °Pt is returning call to Nathan.  °

## 2017-01-04 NOTE — Telephone Encounter (Signed)
-----   Message from Darrol Jumphonda G Barrett, PA-C sent at 01/03/2017  9:26 AM EDT ----- Patient of Dr. Duke Salviaandolph Please let her know that her kidney function is a little worse than usual. Make sure she is not over drinking and choosing the lowest sodium options available to her for meals. Take medications as prescribed, but only take metolazone twice before her follow-up appointment with Corine ShelterLuke Kilroy. Thanks

## 2017-01-08 ENCOUNTER — Other Ambulatory Visit: Payer: Self-pay | Admitting: Cardiovascular Disease

## 2017-01-08 ENCOUNTER — Other Ambulatory Visit: Payer: Self-pay | Admitting: Adult Health

## 2017-01-08 ENCOUNTER — Other Ambulatory Visit: Payer: Self-pay | Admitting: Physician Assistant

## 2017-01-08 DIAGNOSIS — K219 Gastro-esophageal reflux disease without esophagitis: Secondary | ICD-10-CM

## 2017-01-09 NOTE — Telephone Encounter (Signed)
REFILL 

## 2017-01-09 NOTE — Telephone Encounter (Signed)
Please review for refill, Thanks !  

## 2017-01-11 NOTE — Telephone Encounter (Signed)
Sent to the pharmacy by e-scribe. 

## 2017-01-11 NOTE — Telephone Encounter (Signed)
Ok to refill for 6 months 

## 2017-01-13 ENCOUNTER — Encounter: Payer: Self-pay | Admitting: Cardiology

## 2017-01-13 ENCOUNTER — Ambulatory Visit (INDEPENDENT_AMBULATORY_CARE_PROVIDER_SITE_OTHER): Payer: Medicare Other | Admitting: Cardiology

## 2017-01-13 VITALS — BP 116/62 | HR 86 | Ht 66.0 in | Wt 182.4 lb

## 2017-01-13 DIAGNOSIS — I50813 Acute on chronic right heart failure: Secondary | ICD-10-CM

## 2017-01-13 DIAGNOSIS — I272 Pulmonary hypertension, unspecified: Secondary | ICD-10-CM | POA: Diagnosis not present

## 2017-01-13 DIAGNOSIS — I10 Essential (primary) hypertension: Secondary | ICD-10-CM | POA: Diagnosis not present

## 2017-01-13 DIAGNOSIS — N289 Disorder of kidney and ureter, unspecified: Secondary | ICD-10-CM | POA: Diagnosis not present

## 2017-01-13 DIAGNOSIS — Z79899 Other long term (current) drug therapy: Secondary | ICD-10-CM | POA: Diagnosis not present

## 2017-01-13 DIAGNOSIS — I482 Chronic atrial fibrillation, unspecified: Secondary | ICD-10-CM

## 2017-01-13 LAB — BASIC METABOLIC PANEL
BUN/Creatinine Ratio: 33 — ABNORMAL HIGH (ref 12–28)
BUN: 48 mg/dL — ABNORMAL HIGH (ref 10–36)
CO2: 25 mmol/L (ref 20–29)
Calcium: 10.7 mg/dL — ABNORMAL HIGH (ref 8.7–10.3)
Chloride: 106 mmol/L (ref 96–106)
Creatinine, Ser: 1.44 mg/dL — ABNORMAL HIGH (ref 0.57–1.00)
GFR calc Af Amer: 36 mL/min/{1.73_m2} — ABNORMAL LOW (ref >59)
GFR calc non Af Amer: 31 mL/min/{1.73_m2} — ABNORMAL LOW (ref >59)
Glucose: 140 mg/dL — ABNORMAL HIGH (ref 65–99)
Potassium: 4.3 mmol/L (ref 3.5–5.2)
Sodium: 142 mmol/L (ref 134–144)

## 2017-01-13 MED ORDER — METOLAZONE 2.5 MG PO TABS: 2.5000 mg | ORAL_TABLET | Freq: Every day | ORAL | 1 refills | Status: DC

## 2017-01-13 NOTE — Progress Notes (Signed)
01/13/2017 Alyssa BranchRita Cameron   07-30-1918  [id number]  Primary Physician Alyssa Cameron, Alyssa Keenory, NP Primary Cardiologist: Dr Alyssa Cameron  HPI:  Delightful 83 y/o female seen today for f/u of acute on chronic Rt heart failure. The pt saw Alyssa DemarkRhonda Cameron 01/02/17. Her wgt was about 15 lbs up and she had edema an SOB. Her diuretics were adjusted and she is seen in f/u today. She says she feels improved but still has LE edema. Her wgt is down 9 lbs.    Current Outpatient Prescriptions  Medication Sig Dispense Refill  . acetaminophen (TYLENOL) 500 MG tablet Take 500-1,000 mg by mouth every 8 (eight) hours as needed for moderate pain.     Marland Kitchen. amLODipine (NORVASC) 5 MG tablet Take 5 mg by mouth daily.    Marland Kitchen. CRANBERRY PO Take 1 tablet by mouth daily.    . diphenhydrAMINE (BENADRYL) 25 MG tablet Take 25 mg by mouth at bedtime as needed for sleep.     Marland Kitchen. ELIQUIS 5 MG TABS tablet TAKE 1 TABLET BY MOUTH TWO  TIMES DAILY 60 tablet 11  . furosemide (LASIX) 40 MG tablet Take 40 mg by mouth daily.    Marland Kitchen. lisinopril (PRINIVIL,ZESTRIL) 20 MG tablet Take 20 mg by mouth daily.  3  . metolazone (ZAROXOLYN) 2.5 MG tablet Take 1 tablet (2.5 mg total) by mouth daily. Take 1 tablet (2.5 mg total) by mouth twice weekly as directed. 15 tablet 1  . metoprolol succinate (TOPROL-XL) 100 MG 24 hr tablet TAKE 1 TABLET BY MOUTH  DAILY WITH BREAKFAST 90 tablet 1  . pantoprazole (PROTONIX) 40 MG tablet TAKE 1 TABLET BY MOUTH  DAILY 90 tablet 1  . potassium chloride SA (K-DUR,KLOR-CON) 20 MEQ tablet TAKE 1 TABLET BY MOUTH  EVERY DAY 90 tablet 1   No current facility-administered medications for this visit.     Allergies  Allergen Reactions  . Demerol [Meperidine] Nausea Only  . Vicodin [Hydrocodone-Acetaminophen] Nausea And Vomiting    Past Medical History:  Diagnosis Date  . A-fib (HCC)   . Arthritis   . CHF (congestive heart failure) (HCC)   . Diverticulitis   . Frequent UTI   . History of colon polyps   . Hx of colonic polyps      2013 - 2 tubular adenoma sigmoid and Transverse polyp  . Hypertension   . Internal hemorrhoids   . Pulmonary hypertension (HCC) 01/07/2015  . Spinal stenosis   . Urine incontinence     Social History   Social History  . Marital status: Widowed    Spouse name: N/A  . Number of children: N/A  . Years of education: N/A   Occupational History  . Not on file.   Social History Main Topics  . Smoking status: Never Smoker  . Smokeless tobacco: Never Used  . Alcohol use No  . Drug use: No  . Sexual activity: Not on file   Other Topics Concern  . Not on file   Social History Narrative   Retired from [employer]    [place] in [place] from [place]   2 daughter and 2 sons   Likes to read and do puzzles     Family History  Problem Relation Age of Onset  . Heart failure Mother        2764  . Heart failure Father        4875  . Breast cancer Unknown   . Lung cancer Unknown   . Stroke Unknown  Review of Systems: General: negative for chills, fever, night sweats or weight changes.  Cardiovascular: negative for chest pain, dyspnea on exertion, edema, orthopnea, palpitations, paroxysmal nocturnal dyspnea or shortness of breath Dermatological: negative for rash Respiratory: negative for cough or wheezing Urologic: negative for hematuria Abdominal: negative for nausea, vomiting, diarrhea, bright red blood per rectum, melena, or hematemesis Neurologic: negative for visual changes, syncope, or dizziness All other systems reviewed and are otherwise negative except as noted above.    Blood pressure 116/62, pulse 86, height 5\' 6"  (1.676 m), weight 182 lb 6.4 oz (82.7 kg).  General appearance: alert, cooperative and no distress Lungs: clear to auscultation bilaterally Heart: irregularly irregular rhythm Extremities: 2+ weeping edema Neurologic: Grossly normal   ASSESSMENT AND PLAN:   Acute on chronic right heart failure (HCC) It appears her baseline wgt is around 175  lbs. She had gotten up to 190 lbs when Alyssa Cameron saw her 10/22. She is now down to 182 lbs.  Chronic atrial fibrillation (HCC) Rate controlled, she is on Eliquis 5 mg BID  Renal insufficiency Her last Scr was 1.4. This may be the new normal for her. Her Lisinopril was decreased 10/22.  Check BMP today, decrease Lasix to 40 mg daily with metolazone 2.5 mg Tues and Friday. Her Eliquis will need to be reduced if her SCr is > 1.5  Essential hypertension Controlled  Pulmonary hypertension Normal LVF, PA 68 mmHg Oct 2016   PLAN  Check STAT BMP. I suggested she decrease her Lasix to 40 mg daily (she had been taking it BID) and take metolazone 2.5 mg on Tuesday and Friday until her wgt got down to 175- then stop the metolazone. We may have to decrease her Eliquis depending on her labs.   Alyssa Shelter PA-C 01/13/2017 11:01 AM

## 2017-01-13 NOTE — Assessment & Plan Note (Signed)
It appears her baseline wgt is around 175 lbs. She had gotten up to 190 lbs when Bjorn LoserRhonda saw her 10/22. She is now down to 182 lbs.

## 2017-01-13 NOTE — Assessment & Plan Note (Signed)
Her last Scr was 1.4. This may be the new normal for her. Her Lisinopril was decreased 10/22.  Check BMP today, decrease Lasix to 40 mg daily with metolazone 2.5 mg Tues and Friday. Her Eliquis will need to be reduced if her SCr is > 1.5

## 2017-01-13 NOTE — Assessment & Plan Note (Signed)
Rate controlled, she is on Eliquis 5 mg BID

## 2017-01-13 NOTE — Assessment & Plan Note (Signed)
Controlled.  

## 2017-01-13 NOTE — Patient Instructions (Signed)
Medication Instructions: Corine ShelterLuke Kilroy, PA-C, has recommended making the following medication changes: 1. TAKE Furosemide 40 mg ONCE DAILY 2. TAKE Metolazone 2.5 mg on Tuesdays and Fridays, 30 minutes before Furosemide, until your weight reaches 175 pounds, then STOP  Labwork: Your physician recommends that you return for lab work TODAY.  Testing/Procedures: NONE ORDERED  Follow-up: Franky MachoLuke recommends that you schedule a follow-up appointment in 2 months with Dr Duke Salviaandolph.  If you need a refill on your cardiac medications before your next appointment, please call your pharmacy.

## 2017-01-13 NOTE — Assessment & Plan Note (Signed)
Normal LVF, PA 68 mmHg Oct 2016

## 2017-01-17 ENCOUNTER — Ambulatory Visit: Payer: Medicare Other | Admitting: Cardiovascular Disease

## 2017-01-28 ENCOUNTER — Other Ambulatory Visit: Payer: Self-pay | Admitting: Adult Health

## 2017-01-31 NOTE — Telephone Encounter (Signed)
Sent to the pharmacy by e-scribe as instructed. 

## 2017-01-31 NOTE — Telephone Encounter (Signed)
Ok to refill for one year  

## 2017-02-06 ENCOUNTER — Telehealth: Payer: Self-pay

## 2017-02-06 DIAGNOSIS — N289 Disorder of kidney and ureter, unspecified: Secondary | ICD-10-CM

## 2017-02-06 DIAGNOSIS — I10 Essential (primary) hypertension: Secondary | ICD-10-CM

## 2017-02-06 NOTE — Telephone Encounter (Signed)
-----   Message from Abelino DerrickLuke K Kilroy, [place]-C sent at 01/13/2017  2:01 PM EDT ----- Please tell the pt same Rx for now- check another BMP next Thursday  LUKE KILROY PA-C 01/13/2017 2:01 PM

## 2017-02-15 ENCOUNTER — Ambulatory Visit: Payer: Medicare Other | Admitting: Adult Health

## 2017-02-15 ENCOUNTER — Encounter: Payer: Self-pay | Admitting: Adult Health

## 2017-02-15 VITALS — BP 100/60 | HR 60 | Temp 97.7°F | Wt 177.0 lb

## 2017-02-15 DIAGNOSIS — G8929 Other chronic pain: Secondary | ICD-10-CM

## 2017-02-15 DIAGNOSIS — Z23 Encounter for immunization: Secondary | ICD-10-CM

## 2017-02-15 DIAGNOSIS — M25561 Pain in right knee: Secondary | ICD-10-CM

## 2017-02-15 DIAGNOSIS — M25562 Pain in left knee: Secondary | ICD-10-CM | POA: Diagnosis not present

## 2017-02-15 DIAGNOSIS — G4700 Insomnia, unspecified: Secondary | ICD-10-CM

## 2017-02-15 MED ORDER — TRAZODONE HCL 50 MG PO TABS: 25.0000 mg | ORAL_TABLET | Freq: Every evening | ORAL | 3 refills | Status: DC | PRN

## 2017-02-15 NOTE — Progress Notes (Signed)
Subjective:    Patient ID: Alyssa Cameron, female    DOB: June 22, 1918, 83 y.o.   MRN: [id number]  HPI  83 year old female who  has a past medical history of A-fib (HCC), Arthritis, CHF (congestive heart failure) (HCC), Diverticulitis, Frequent UTI, History of colon polyps, colonic polyps, Hypertension, Internal hemorrhoids, Pulmonary hypertension (HCC) (01/07/2015), Spinal stenosis, and Urine incontinence.   She presents to the office today to discuss chronic insomnia and chronic knee pain. The pain is worse in the evening and night. She is currently taking two extra strength tylenols and 25 mg of benadryl. She reports that she will fall asleep at 11 and then wakes up at 1-1:30 am and is not able to fall back to sleep   She would also like her pneumonia vaccination   Overall, she feels as though she is doing well and has no additional complaints.    Review of Systems See HPI  Past Medical History:  Diagnosis Date  . A-fib (HCC)   . Arthritis   . CHF (congestive heart failure) (HCC)   . Diverticulitis   . Frequent UTI   . History of colon polyps   . Hx of colonic polyps    2013 - 2 tubular adenoma sigmoid and Transverse polyp  . Hypertension   . Internal hemorrhoids   . Pulmonary hypertension (HCC) 01/07/2015  . Spinal stenosis   . Urine incontinence     Social History   Socioeconomic History  . Marital status: Widowed    Spouse name: Not on file  . Number of children: Not on file  . Years of education: Not on file  . Highest education level: Not on file  Social Needs  . Financial resource strain: Not on file  . Food insecurity - worry: Not on file  . Food insecurity - inability: Not on file  . Transportation needs - medical: Not on file  . Transportation needs - non-medical: Not on file  Occupational History  . Not on file  Tobacco Use  . Smoking status: Never Smoker  . Smokeless tobacco: Never Used  Substance and Sexual Activity  . Alcohol use: No    Alcohol/week:  0.0 oz  . Drug use: No  . Sexual activity: Not on file  Other Topics Concern  . Not on file  Social History Narrative   Retired from [employer]    [place] in [place] and [place] from [place]   2 daughter and 2 sons   Likes to read and do puzzles    Past Surgical History:  Procedure Laterality Date  . APPENDECTOMY  1941  . BREAST BIOPSY  2010  . CATARACT EXTRACTION    . GALLBLADDER SURGERY  1970  . TONSILLECTOMY AND ADENOIDECTOMY  1941  . VAGINAL HYSTERECTOMY      Family History  Problem Relation Age of Onset  . Heart failure Mother        2664  . Heart failure Father        3475  . Breast cancer Unknown   . Lung cancer Unknown   . Stroke Unknown     Allergies  Allergen Reactions  . Demerol [Meperidine] Nausea Only  . Vicodin [Hydrocodone-Acetaminophen] Nausea And Vomiting    Current Outpatient Medications on File Prior to Visit  Medication Sig Dispense Refill  . acetaminophen (TYLENOL) 500 MG tablet Take 500-1,000 mg by mouth every 8 (eight) hours as needed for moderate pain.     Marland Kitchen. amLODipine (NORVASC) 5 MG tablet  Take 5 mg by mouth daily.    Marland Kitchen. CRANBERRY PO Take 1 tablet by mouth daily.    . diphenhydrAMINE (BENADRYL) 25 MG tablet Take 25 mg by mouth at bedtime as needed for sleep.     Marland Kitchen. ELIQUIS 5 MG TABS tablet TAKE 1 TABLET BY MOUTH TWO  TIMES DAILY 180 tablet 3  . furosemide (LASIX) 40 MG tablet Take 40 mg by mouth daily.    Marland Kitchen. lisinopril (PRINIVIL,ZESTRIL) 20 MG tablet Take 20 mg by mouth daily.  3  . metolazone (ZAROXOLYN) 2.5 MG tablet Take 1 tablet (2.5 mg total) by mouth daily. Take 1 tablet (2.5 mg total) by mouth twice weekly as directed. 15 tablet 1  . metoprolol succinate (TOPROL-XL) 100 MG 24 hr tablet TAKE 1 TABLET BY MOUTH  DAILY WITH BREAKFAST 90 tablet 1  . pantoprazole (PROTONIX) 40 MG tablet TAKE 1 TABLET BY MOUTH  DAILY 90 tablet 1  . potassium chloride SA (K-DUR,KLOR-CON) 20 MEQ tablet TAKE 1 TABLET BY MOUTH  EVERY DAY 90 tablet 1   No current  facility-administered medications on file prior to visit.     BP 100/60 (BP Location: Left Arm, Patient Position: Sitting, Cuff Size: Normal)   Pulse 60   Temp 97.7 F (36.5 C) (Oral)   Wt 177 lb (80.3 kg)   SpO2 96%   BMI 28.57 kg/m       Objective:   Physical Exam  Constitutional: She is oriented to person, place, and time. She appears well-developed and well-nourished. No distress.  Cardiovascular: Normal rate, regular rhythm, normal heart sounds and intact distal pulses. Exam reveals no gallop and no friction rub.  No murmur heard. Pulmonary/Chest: Effort normal and breath sounds normal. No respiratory distress. She has no wheezes. She has no rales. She exhibits no tenderness.  Musculoskeletal: She exhibits edema (bilateral lower extremity ) and tenderness (bilateral lower extremities ).  Walks with slow steady gait with walker  Neurological: She is alert and oriented to person, place, and time.  Skin: Skin is warm and dry. No rash noted. She is not diaphoretic. No erythema. No pallor.  Psychiatric: She has a normal mood and affect. Her behavior is normal. Judgment and thought content normal.  Nursing note and vitals reviewed.     Assessment & Plan:  Will trial her on Trazodone 25 mg for insomnia and chronic pain. Advised that this will make her sleep. Return precautions given. She is going to follow up in one month. Advised not to take with benadryl   - Pneumococcal polysaccharide vaccine 23-valent greater than or equal to 2yo subcutaneous/IM  Shirline Freesory Evangelyn Crouse, NP

## 2017-02-15 NOTE — Patient Instructions (Signed)
It was so great seeing you today   I have sent in a prescription for Trazodone to hopefully help with sleep and pain. Please start off with 25 mg ( 1/2 tab).   Follow up with me the first week of January   If you need anything before that, please let me know

## 2017-02-16 LAB — BASIC METABOLIC PANEL
BUN/Creatinine Ratio: 29 — ABNORMAL HIGH (ref 12–28)
BUN: 46 mg/dL — ABNORMAL HIGH (ref 10–36)
CO2: 22 mmol/L (ref 20–29)
Calcium: 10 mg/dL (ref 8.7–10.3)
Chloride: 105 mmol/L (ref 96–106)
Creatinine, Ser: 1.61 mg/dL — ABNORMAL HIGH (ref 0.57–1.00)
GFR calc Af Amer: 31 mL/min/{1.73_m2} — ABNORMAL LOW (ref >59)
GFR calc non Af Amer: 27 mL/min/{1.73_m2} — ABNORMAL LOW (ref >59)
Glucose: 146 mg/dL — ABNORMAL HIGH (ref 65–99)
Potassium: 5.9 mmol/L — ABNORMAL HIGH (ref 3.5–5.2)
Sodium: 143 mmol/L (ref 134–144)

## 2017-02-21 ENCOUNTER — Other Ambulatory Visit: Payer: Self-pay | Admitting: Cardiology

## 2017-02-21 DIAGNOSIS — N289 Disorder of kidney and ureter, unspecified: Secondary | ICD-10-CM

## 2017-02-21 NOTE — Progress Notes (Signed)
error 

## 2017-02-27 ENCOUNTER — Telehealth: Payer: Self-pay | Admitting: Cardiovascular Disease

## 2017-02-27 NOTE — Telephone Encounter (Signed)
Spoke with pt, aware to stop the metolazone. She will come by the office later this week for repeat labs.

## 2017-02-27 NOTE — Telephone Encounter (Signed)
Patient calling for lab work results

## 2017-02-28 ENCOUNTER — Encounter: Payer: Self-pay | Admitting: *Deleted

## 2017-02-28 ENCOUNTER — Other Ambulatory Visit: Payer: Self-pay

## 2017-02-28 MED ORDER — LISINOPRIL 20 MG PO TABS: 20.0000 mg | ORAL_TABLET | Freq: Every day | ORAL | 3 refills | Status: DC

## 2017-03-01 ENCOUNTER — Other Ambulatory Visit: Payer: Self-pay

## 2017-03-01 ENCOUNTER — Other Ambulatory Visit: Payer: Self-pay | Admitting: *Deleted

## 2017-03-01 DIAGNOSIS — N289 Disorder of kidney and ureter, unspecified: Secondary | ICD-10-CM

## 2017-03-01 MED ORDER — LISINOPRIL 20 MG PO TABS: 20.0000 mg | ORAL_TABLET | Freq: Every day | ORAL | 3 refills | Status: DC

## 2017-03-01 NOTE — Telephone Encounter (Signed)
Rx(s) sent to pharmacy electronically.  

## 2017-03-02 ENCOUNTER — Telehealth: Payer: Self-pay | Admitting: Cardiovascular Disease

## 2017-03-02 LAB — BASIC METABOLIC PANEL
BUN/Creatinine Ratio: 38 — ABNORMAL HIGH (ref 12–28)
BUN: 56 mg/dL — ABNORMAL HIGH (ref 10–36)
CO2: 19 mmol/L — ABNORMAL LOW (ref 20–29)
Calcium: 9.5 mg/dL (ref 8.7–10.3)
Chloride: 103 mmol/L (ref 96–106)
Creatinine, Ser: 1.46 mg/dL — ABNORMAL HIGH (ref 0.57–1.00)
GFR calc Af Amer: 35 mL/min/{1.73_m2} — ABNORMAL LOW (ref >59)
GFR calc non Af Amer: 30 mL/min/{1.73_m2} — ABNORMAL LOW (ref >59)
Glucose: 143 mg/dL — ABNORMAL HIGH (ref 65–99)
Potassium: 4.9 mmol/L (ref 3.5–5.2)
Sodium: 140 mmol/L (ref 134–144)

## 2017-03-02 NOTE — Telephone Encounter (Signed)
Called pt she states that she states that she is not taking metolazone at this time do not refill she will discuss this at upcoming scheduled appt   [facility] notified

## 2017-03-02 NOTE — Telephone Encounter (Signed)
New Message  Feliz Beamravis from [facility] is calling about a refill for metolazone (ZAROXOLYN) 2.5 MG tablet. He states that a request was sent in on 02/27/2017. 90 day not working. Contact number for [facility] is [phone]. Please call.   Dot phrases not working.

## 2017-03-03 ENCOUNTER — Other Ambulatory Visit: Payer: Self-pay | Admitting: Physician Assistant

## 2017-03-08 ENCOUNTER — Telehealth: Payer: Self-pay | Admitting: *Deleted

## 2017-03-08 MED ORDER — APIXABAN 2.5 MG PO TABS: 2.5000 mg | ORAL_TABLET | Freq: Two times a day (BID) | ORAL | Status: DC

## 2017-03-08 NOTE — Telephone Encounter (Signed)
-----   Message from Chilton Siiffany [place], MD sent at 03/08/2017 12:55 PM EST ----- Reduce Eliquis to 2.5mg  bid.

## 2017-03-08 NOTE — Telephone Encounter (Signed)
Spoke with pt, she voiced understanding and repeated medication change. She will call when a refill is needed.

## 2017-03-13 ENCOUNTER — Other Ambulatory Visit: Payer: Self-pay | Admitting: Family Medicine

## 2017-03-13 ENCOUNTER — Telehealth: Payer: Self-pay | Admitting: Cardiovascular Disease

## 2017-03-13 DIAGNOSIS — I4891 Unspecified atrial fibrillation: Secondary | ICD-10-CM | POA: Insufficient documentation

## 2017-03-13 DIAGNOSIS — Z860100 Personal history of colon polyps, unspecified: Secondary | ICD-10-CM | POA: Insufficient documentation

## 2017-03-13 DIAGNOSIS — I10 Essential (primary) hypertension: Secondary | ICD-10-CM | POA: Insufficient documentation

## 2017-03-13 DIAGNOSIS — I509 Heart failure, unspecified: Secondary | ICD-10-CM | POA: Insufficient documentation

## 2017-03-13 DIAGNOSIS — R32 Unspecified urinary incontinence: Secondary | ICD-10-CM | POA: Insufficient documentation

## 2017-03-13 DIAGNOSIS — G8929 Other chronic pain: Secondary | ICD-10-CM

## 2017-03-13 DIAGNOSIS — N390 Urinary tract infection, site not specified: Secondary | ICD-10-CM | POA: Insufficient documentation

## 2017-03-13 DIAGNOSIS — G4700 Insomnia, unspecified: Secondary | ICD-10-CM

## 2017-03-13 DIAGNOSIS — M4800 Spinal stenosis, site unspecified: Secondary | ICD-10-CM | POA: Insufficient documentation

## 2017-03-13 DIAGNOSIS — K5792 Diverticulitis of intestine, part unspecified, without perforation or abscess without bleeding: Secondary | ICD-10-CM | POA: Insufficient documentation

## 2017-03-13 DIAGNOSIS — M25561 Pain in right knee: Principal | ICD-10-CM

## 2017-03-13 DIAGNOSIS — K648 Other hemorrhoids: Secondary | ICD-10-CM | POA: Insufficient documentation

## 2017-03-13 DIAGNOSIS — M25562 Pain in left knee: Principal | ICD-10-CM

## 2017-03-13 MED ORDER — TRAZODONE HCL 50 MG PO TABS: 25.0000 mg | ORAL_TABLET | Freq: Every evening | ORAL | 0 refills | Status: DC | PRN

## 2017-03-13 NOTE — Progress Notes (Signed)
Cardiology Office Note   Date:  03/15/2017   ID:  Alyssa Cameron, DOB [birth year], MRN [id number]  PCP:  Shirline Frees, NP  Cardiologist:  Chilton Si, MD Chief Complaint  Patient presents with  . Leg Swelling    fatigue, nausea, discuss Amolodipine side effects, can she resume Metolazone??     History of Present Illness: Alyssa Cameron is a 83 y.o. female who presents for ongoing assessment and management of acute on chronic right heart failure, hypertension.  Persistent atrial fibrillation.  She was last seen in the office on 01/13/2017 by [name], PA.  At that time, she was euvolemic, weight was 182 pounds, atrial fibrillation rate was well controlled she remains on Eliquis without complaints of bleeding.   On that office visit she was to decrease her Lasix to 40 mg daily, she apparently had been taking it twice daily.  She was advised to take metolazone 2.5 mg on Tuesdays and Fridays only until her weight Down to 175 pounds and then stop taking the metolazone.  Follow-up BMET dated 12, 19, 2018,, revealed sodium 148, potassium 4.9, chloride 103, CO2 19, glucose 143, BUN 56, creatinine 1.46.  Hemoglobin 10.8, hematocrit 31.5, white blood cells 5.9, platelets 182.  (Dated 11/16/2016.).  She is here today with complaints of worsening lower extremity edema, some weight gain.  She is not felt as if the medication changes have been helpful.  She had been taken off of metolazone as well.  Past Medical History:  Diagnosis Date  . A-fib (HCC)   . Arthritis   . CHF (congestive heart failure) (HCC)   . Diverticulitis   . Frequent UTI   . History of colon polyps   . Hx of colonic polyps    2013 - 2 tubular adenoma sigmoid and Transverse polyp  . Hypertension   . Internal hemorrhoids   . Pulmonary hypertension (HCC) 01/07/2015  . Spinal stenosis   . Urine incontinence     Past Surgical History:  Procedure Laterality Date  . APPENDECTOMY  1941  . BREAST BIOPSY  2010  . CATARACT EXTRACTION     . GALLBLADDER SURGERY  1970  . TONSILLECTOMY AND ADENOIDECTOMY  1941  . VAGINAL HYSTERECTOMY       Current Outpatient Medications  Medication Sig Dispense Refill  . acetaminophen (TYLENOL) 500 MG tablet Take 500-1,000 mg by mouth every 8 (eight) hours as needed for moderate pain.     Marland Kitchen apixaban (ELIQUIS) 2.5 MG TABS tablet Take 1 tablet (2.5 mg total) by mouth 2 (two) times daily. 60 tablet   . CRANBERRY PO Take 1 tablet by mouth daily.    . furosemide (LASIX) 40 MG tablet Take 40 mg twice a day 90 tablet 3  . lisinopril (PRINIVIL,ZESTRIL) 20 MG tablet Take 1 tablet (20 mg total) by mouth daily. 90 tablet 3  . metoprolol succinate (TOPROL-XL) 100 MG 24 hr tablet TAKE 1 TABLET BY MOUTH  DAILY WITH BREAKFAST 90 tablet 1  . pantoprazole (PROTONIX) 40 MG tablet TAKE 1 TABLET BY MOUTH  DAILY 90 tablet 1  . potassium chloride SA (K-DUR,KLOR-CON) 20 MEQ tablet TAKE 1 TABLET BY MOUTH  EVERY DAY 90 tablet 1  . traZODone (DESYREL) 50 MG tablet Take 0.5-1 tablets (25-50 mg total) by mouth at bedtime as needed for sleep. 90 tablet 0   No current facility-administered medications for this visit.     Allergies:   Demerol [meperidine] and Vicodin [hydrocodone-acetaminophen]    Social History:  The patient  reports that  has never smoked. she has never used smokeless tobacco. She reports that she does not drink alcohol or use drugs.   Family History:  The patient's family history includes Breast cancer in her unknown relative; Heart failure in her father and mother; Lung cancer in her unknown relative; Stroke in her unknown relative.    ROS: All other systems are reviewed and negative. Unless otherwise mentioned in H&P     PHYSICAL EXAM: VS:  BP 120/68   Pulse 60   Ht 5\' 6"  (1.676 m)   Wt 186 lb 3.2 oz (84.5 kg)   BMI 30.05 kg/m  , BMI Body mass index is 30.05 kg/m. GEN: Well nourished, well developed, in no acute distress  HEENT: normal  Neck: no JVD, carotid bruits, or  masses Cardiac: RRR; no murmurs, rubs, or gallops, 2+ pretibial and ankle edema  Respiratory:  Clear to auscultation bilaterally, normal work of breathing GI: soft, nontender, nondistended, + BS MS: no deformity or atrophy  Skin: warm and dry, no rash Neuro:  Strength and sensation are intact Psych: euthymic mood, full affect  Recent Labs: 11/16/2016: Hemoglobin 10.0; Platelets 182 03/01/2017: BUN 56; Creatinine, Ser 1.46; Potassium 4.9; Sodium 140    Lipid Panel    Component Value Date/Time   CHOL 159 11/21/2014 0820   TRIG 98 11/21/2014 0820   HDL 56 11/21/2014 0820   CHOLHDL 2.8 11/21/2014 0820   VLDL 20 11/21/2014 0820   LDLCALC 83 11/21/2014 0820      Wt Readings from Last 3 Encounters:  03/15/17 186 lb 3.2 oz (84.5 kg)  02/15/17 177 lb (80.3 kg)  01/13/17 182 lb 6.4 oz (82.7 kg)      Other studies Reviewed: Echocardiogram 12/18/2014 Left ventricle: The cavity size was normal. Posterior wall   thickness was increased in a pattern of mild LVH. Systolic   function was vigorous. The estimated ejection fraction was in the   range of 65% to 70%. Wall motion was normal; there were no   regional wall motion abnormalities. - Mitral valve: There was mild regurgitation. - Left atrium: The atrium was severely dilated. - Right ventricle: The cavity size was normal. Wall thickness was   normal. Systolic function was mildly reduced. - Tricuspid valve: There was moderate regurgitation. Peak RV-RA   gradient (S): 53 mm Hg. - Pulmonary arteries: PA peak pressure: 68 mm Hg (S). - Inferior vena cava: The vessel was dilated. The respirophasic   diameter changes were blunted (< 50%), consistent with elevated   central venous pressure.  ASSESSMENT AND PLAN:  1.  Chronic diastolic heart failure: The patient had been taken off metolazone which she was on Tuesdays and Fridays, and had increased dose of Lasix which was unhelpful for her at this time.  She also continues to complain of  chronic lower extremity edema.  I believe this is multifactorial.  She is on amlodipine which can cause significant dependent edema.  I am going to stop this and see if this is helpful.  She is going to be placed back on metolazone once a week at 2.5 mg on Tuesdays.  And I will leave her on the Lasix 40 mg twice daily for the next few days.  I will see her in close follow-up within the next 5 days to evaluate her progress.  I will repeat a BMET at that time.  2.  Hypertension: Blood pressure is well controlled but she does complain of some lightheadedness and dizziness when  she gets up and some sluggishness with walking.  I am wondering at her age of her blood pressure needs to be a little higher for her to be able to tolerate it better.  Stopping amlodipine for now with close follow-up may need to adjust when being seen again.   Current medicines are reviewed at length with the patient today.    Labs/ tests ordered today include: BMET Alyssa MareKathryn M. Liborio NixonLawrence DNP, ANP, AACC   03/15/2017 4:22 PM    [facility] [address], [place], [place] [address] Phone: [phone]; Fax: [phone]

## 2017-03-13 NOTE — Telephone Encounter (Signed)
Mrs. Alyssa Cameron is calling because her legs are swelling and she has gained weight over the last couple of days . Her weight has gone from 178- 185. She has no Shortness of breath , just both of her legs are swelling . Please call   Thanks

## 2017-03-13 NOTE — Telephone Encounter (Signed)
[facility] days [facility]  Received fax from the pharmacy.  Pt now requesting 90 day supply.  Sent in for 90 day (3 months).

## 2017-03-13 NOTE — Telephone Encounter (Signed)
Returned call to patient.She stated she has increased swelling in lower legs and feet since stopping metolazone.Stated weight has been increasing 1 lb everyday.No sob.Spoke to DOD Dr.Hilty he advised to increase Lasix to 40 mg twice a day.Appointment scheduled with Joni ReiningKathryn Lawrence DNP 03/15/17 at 3:30 pm.

## 2017-03-15 ENCOUNTER — Ambulatory Visit: Payer: Medicare Other | Admitting: Adult Health

## 2017-03-15 VITALS — BP 120/68 | HR 60 | Ht 66.0 in | Wt 186.2 lb

## 2017-03-15 DIAGNOSIS — I10 Essential (primary) hypertension: Secondary | ICD-10-CM | POA: Diagnosis not present

## 2017-03-15 DIAGNOSIS — Z79899 Other long term (current) drug therapy: Secondary | ICD-10-CM

## 2017-03-15 DIAGNOSIS — I5033 Acute on chronic diastolic (congestive) heart failure: Secondary | ICD-10-CM | POA: Diagnosis not present

## 2017-03-15 MED ORDER — METOLAZONE 2.5 MG PO TABS: 2.5000 mg | ORAL_TABLET | ORAL | 3 refills | Status: DC

## 2017-03-15 NOTE — Patient Instructions (Signed)
Medication Instructions:  STOP AMLODIPINE  TAKE METOLAZONE 2.5MG  EVERY TUESDAY  If you need a refill on your cardiac medications before your next appointment, please call your pharmacy.  Labwork: BMET-THE DAY BEFORE YOU APPOINTMENT NEXT WEEK HERE IN OUR OFFICE AT [facility]  Take the provided lab slips for you to take with you to the lab for you blood draw.   You will NOT need to fast   You may go to any [facility] lab that is convenient for you however, we do have a lab in our office that is able to assist you. You do NOT need an appointment for our lab. Once in our office lobby there is a podium to the right of the check-in desk where you are to sign-in and ring a doorbell to alert us you are here. Lab is open Monday-Friday from 8:00am to 4:00pm; and is closed for lunch from 12:45p-1:45pm   Special Instructions: IF YOUR BP SHOULD GO UP IT IS OK.  Follow-Up: Your physician wants you to follow-up in: 1 WEEK WITH Joni ReiningKATHRYN LAWRENCE, DNP.   Thank you for choosing [facility]!!

## 2017-03-17 ENCOUNTER — Ambulatory Visit: Payer: Medicare Other | Admitting: Adult Health

## 2017-03-17 ENCOUNTER — Encounter: Payer: Self-pay | Admitting: Adult Health

## 2017-03-17 VITALS — BP 120/60 | Temp 98.1°F | Ht 66.0 in | Wt 184.0 lb

## 2017-03-17 DIAGNOSIS — G4700 Insomnia, unspecified: Secondary | ICD-10-CM

## 2017-03-17 DIAGNOSIS — M25562 Pain in left knee: Secondary | ICD-10-CM

## 2017-03-17 DIAGNOSIS — G8929 Other chronic pain: Secondary | ICD-10-CM | POA: Diagnosis not present

## 2017-03-17 DIAGNOSIS — M25561 Pain in right knee: Secondary | ICD-10-CM

## 2017-03-17 NOTE — Progress Notes (Signed)
Subjective:    Patient ID: Alyssa Cameron, female    DOB: 09-27-1918, 84 y.o.   MRN: [id number]  HPI  84 year old female who  has a past medical history of A-fib (HCC), Arthritis, CHF (congestive heart failure) (HCC), Diverticulitis, Frequent UTI, History of colon polyps, colonic polyps, Hypertension, Internal hemorrhoids, Pulmonary hypertension (HCC) (01/07/2015), Spinal stenosis, and Urine incontinence. She presents to the office today one month follow up after starting Trazodone for insomnia and chronic low extremity pain. She was started on Trazodone 12.5  mg. Today in the office she reports that she feels as though she is sleeping "better" and her chronic knee pain has improved. She has not noticed any side effects   Review of Systems  Neurological: Negative.   All other systems reviewed and are negative.  See HPI   Past Medical History:  Diagnosis Date  . A-fib (HCC)   . Arthritis   . CHF (congestive heart failure) (HCC)   . Diverticulitis   . Frequent UTI   . History of colon polyps   . Hx of colonic polyps    2013 - 2 tubular adenoma sigmoid and Transverse polyp  . Hypertension   . Internal hemorrhoids   . Pulmonary hypertension (HCC) 01/07/2015  . Spinal stenosis   . Urine incontinence     Social History   Socioeconomic History  . Marital status: Widowed    Spouse name: Not on file  . Number of children: Not on file  . Years of education: Not on file  . Highest education level: Not on file  Social Needs  . Financial resource strain: Not on file  . Food insecurity - worry: Not on file  . Food insecurity - inability: Not on file  . Transportation needs - medical: Not on file  . Transportation needs - non-medical: Not on file  Occupational History  . Not on file  Tobacco Use  . Smoking status: Never Smoker  . Smokeless tobacco: Never Used  Substance and Sexual Activity  . Alcohol use: No    Alcohol/week: 0.0 oz  . Drug use: No  . Sexual activity: Not on file    Other Topics Concern  . Not on file  Social History Narrative   Retired from [employer] in [place] and moved here from [place]   2 daughter and 2 sons   Likes to read and do puzzles    Past Surgical History:  Procedure Laterality Date  . APPENDECTOMY  1941  . BREAST BIOPSY  2010  . CATARACT EXTRACTION    . GALLBLADDER SURGERY  1970  . TONSILLECTOMY AND ADENOIDECTOMY  1941  . VAGINAL HYSTERECTOMY      Family History  Problem Relation Age of Onset  . Heart failure Mother        40  . Heart failure Father        16  . Breast cancer Unknown   . Lung cancer Unknown   . Stroke Unknown     Allergies  Allergen Reactions  . Demerol [Meperidine] Nausea Only  . Vicodin [Hydrocodone-Acetaminophen] Nausea And Vomiting    Current Outpatient Medications on File Prior to Visit  Medication Sig Dispense Refill  . acetaminophen (TYLENOL) 500 MG tablet Take 500-1,000 mg by mouth every 8 (eight) hours as needed for moderate pain.     Marland Kitchen apixaban (ELIQUIS) 2.5 MG TABS tablet Take 1 tablet (2.5 mg total) by mouth 2 (two) times daily. 60 tablet   .  CRANBERRY PO Take 1 tablet by mouth daily.    . furosemide (LASIX) 40 MG tablet Take 40 mg twice a day 90 tablet 3  . lisinopril (PRINIVIL,ZESTRIL) 20 MG tablet Take 1 tablet (20 mg total) by mouth daily. 90 tablet 3  . metolazone (ZAROXOLYN) 2.5 MG tablet Take 1 tablet (2.5 mg total) by mouth once a week. Take weekly on tuesday 4 tablet 3  . metoprolol succinate (TOPROL-XL) 100 MG 24 hr tablet TAKE 1 TABLET BY MOUTH  DAILY WITH BREAKFAST 90 tablet 1  . pantoprazole (PROTONIX) 40 MG tablet TAKE 1 TABLET BY MOUTH  DAILY 90 tablet 1  . potassium chloride SA (K-DUR,KLOR-CON) 20 MEQ tablet TAKE 1 TABLET BY MOUTH  EVERY DAY 90 tablet 1  . traZODone (DESYREL) 50 MG tablet Take 0.5-1 tablets (25-50 mg total) by mouth at bedtime as needed for sleep. 90 tablet 0   No current facility-administered medications on file prior to visit.     BP 120/60 (BP  Location: Left Arm, Patient Position: Sitting, Cuff Size: Normal)   Temp 98.1 F (36.7 C) (Oral)   Ht 5\' 6"  (1.676 m)   Wt 184 lb (83.5 kg)   BMI 29.70 kg/m       Objective:   Physical Exam  Constitutional: She is oriented to person, place, and time. She appears well-developed and well-nourished. No distress.  Cardiovascular: Normal rate, regular rhythm, normal heart sounds and intact distal pulses. Exam reveals no gallop.  No murmur heard. Pulmonary/Chest: Effort normal and breath sounds normal. No respiratory distress. She has no wheezes. She has no rales. She exhibits no tenderness.  Musculoskeletal:  Walks with steady gait with rolling walker   Neurological: She is alert and oriented to person, place, and time.  Skin: Skin is warm and dry. She is not diaphoretic.  Psychiatric: She has a normal mood and affect. Her behavior is normal. Judgment and thought content normal.  Nursing note and vitals reviewed.     Assessment & Plan:  Seems to be helping her sleep and with chronic pain. Continue current dose. Follow up as needed  Shirline Freesory Joelie Schou, NP

## 2017-03-22 ENCOUNTER — Ambulatory Visit: Payer: Medicare Other | Admitting: Adult Health

## 2017-03-22 ENCOUNTER — Encounter: Payer: Self-pay | Admitting: Adult Health

## 2017-03-22 VITALS — BP 118/76 | HR 96 | Ht 66.0 in | Wt 182.4 lb

## 2017-03-22 DIAGNOSIS — I5032 Chronic diastolic (congestive) heart failure: Secondary | ICD-10-CM

## 2017-03-22 DIAGNOSIS — R609 Edema, unspecified: Secondary | ICD-10-CM | POA: Diagnosis not present

## 2017-03-22 DIAGNOSIS — I482 Chronic atrial fibrillation, unspecified: Secondary | ICD-10-CM

## 2017-03-22 LAB — BASIC METABOLIC PANEL
BUN / CREAT RATIO: 27 (ref 12–28)
BUN: 42 mg/dL — AB (ref 10–36)
CO2: 23 mmol/L (ref 20–29)
CREATININE: 1.56 mg/dL — AB (ref 0.57–1.00)
Calcium: 10 mg/dL (ref 8.7–10.3)
Chloride: 102 mmol/L (ref 96–106)
GFR, EST AFRICAN AMERICAN: 32 mL/min/{1.73_m2} — AB (ref >59)
GFR, EST NON AFRICAN AMERICAN: 28 mL/min/{1.73_m2} — AB (ref >59)
GLUCOSE: 168 mg/dL — AB (ref 65–99)
Potassium: 5 mmol/L (ref 3.5–5.2)
SODIUM: 143 mmol/L (ref 134–144)

## 2017-03-22 NOTE — Patient Instructions (Signed)
Medication Instructions:  Alternate 60mg  and 40mg  lasix ever-other-day  Continue metolazone on Tuesday's  If you need a refill on your cardiac medications before your next appointment, please call your pharmacy.  Special Instructions: PLEASE START WEARING TED HOSE DAILY AND OFF AT NIGHT.  Follow-Up: Your physician wants you to follow-up in: KEEP SCHEDULED APPOINTMENT WITH DR [place].  Thank you for choosing [facility]!!

## 2017-03-22 NOTE — Progress Notes (Signed)
Cardiology Office Note    Date:  03/22/2017   ID:  Alyssa Cameron, DOB Mar 25, 1918, MRN [id number]  PCP:  Shirline Frees, NP  Cardiologist:  Chilton Si MD   Chief Complaint  Patient presents with  . Atrial Fibrillation  . Hypertension  . Congestive Heart Failure     History of Present Illness: Alyssa Cameron is a 84 y.o. female who presents for ongoing assessment and management of acute on chronic right-sided heart failure, history of persistent atrial fibrillation, and hypertension.  He was last seen in the office on 03/15/2017, with worsening complaints of lower extremity edema and weight gain.  On the last office visit she was taken off of amlodipine as this was causing significant edema.  She was started back on metolazone 2.5 mg on Tuesdays.  Her Lasix was left at 40 mg twice daily for the next few days, and is here on close follow to to evaluate her response to medication changes and need for further adjustment.  At that time she was mildly hypotensive.  She was advised to keep track of her blood pressure at home with medication changes and it will be correlated with her office visit today.  She has lost 2 more lbs since being seen last. LEE is improved. She still feels she is having fluid retention in her LE but is happy that is has lessened. She is able to fit into her shoes now. She denies any other symptoms such as dizziness or weakness.,   Past Medical History:  Diagnosis Date  . A-fib (HCC)   . Arthritis   . CHF (congestive heart failure) (HCC)   . Diverticulitis   . Frequent UTI   . History of colon polyps   . Hx of colonic polyps    2013 - 2 tubular adenoma sigmoid and Transverse polyp  . Hypertension   . Internal hemorrhoids   . Pulmonary hypertension (HCC) 01/07/2015  . Spinal stenosis   . Urine incontinence     Past Surgical History:  Procedure Laterality Date  . APPENDECTOMY  1941  . BREAST BIOPSY  2010  . CATARACT EXTRACTION    . GALLBLADDER SURGERY  1970  .  TONSILLECTOMY AND ADENOIDECTOMY  1941  . VAGINAL HYSTERECTOMY       Current Outpatient Medications  Medication Sig Dispense Refill  . acetaminophen (TYLENOL) 500 MG tablet Take 500-1,000 mg by mouth every 8 (eight) hours as needed for moderate pain.     Marland Kitchen apixaban (ELIQUIS) 2.5 MG TABS tablet Take 1 tablet (2.5 mg total) by mouth 2 (two) times daily. 60 tablet   . CRANBERRY PO Take 1 tablet by mouth daily.    . furosemide (LASIX) 40 MG tablet Take 60 mg by mouth daily. Alternate 60mg  and 40mg  dosing every-other-day 90 tablet 3  . lisinopril (PRINIVIL,ZESTRIL) 20 MG tablet Take 1 tablet (20 mg total) by mouth daily. 90 tablet 3  . metolazone (ZAROXOLYN) 2.5 MG tablet Take 1 tablet (2.5 mg total) by mouth once a week. Take weekly on tuesday 4 tablet 3  . metoprolol succinate (TOPROL-XL) 100 MG 24 hr tablet TAKE 1 TABLET BY MOUTH  DAILY WITH BREAKFAST 90 tablet 1  . pantoprazole (PROTONIX) 40 MG tablet TAKE 1 TABLET BY MOUTH  DAILY 90 tablet 1  . potassium chloride SA (K-DUR,KLOR-CON) 20 MEQ tablet TAKE 1 TABLET BY MOUTH  EVERY DAY 90 tablet 1  . traZODone (DESYREL) 50 MG tablet Take 0.5-1 tablets (25-50 mg total) by mouth at bedtime as  needed for sleep. 90 tablet 0   No current facility-administered medications for this visit.     Allergies:   Demerol [meperidine] and Vicodin [hydrocodone-acetaminophen]    Social History:  The patient  reports that  has never smoked. she has never used smokeless tobacco. She reports that she does not drink alcohol or use drugs.   Family History:  The patient's family history includes Breast cancer in her unknown relative; Heart failure in her father and mother; Lung cancer in her unknown relative; Stroke in her unknown relative.    ROS: All other systems are reviewed and negative. Unless otherwise mentioned in H&P    PHYSICAL EXAM: VS:  BP 118/76   Pulse 96   Ht 5\' 6"  (1.676 m)   Wt 182 lb 6.4 oz (82.7 kg)   BMI 29.44 kg/m  , BMI Body mass index  is 29.44 kg/m. GEN: Well nourished, well developed, in no acute distress  HEENT: normal  Neck: no JVD, carotid bruits, or masses Cardiac: IRRR; no murmurs, rubs, or gallops,very mild non-pitting dependent  edema  Respiratory:  clear to auscultation bilaterally, normal work of breathing GI: soft, nontender, nondistended, + BS MS: no deformity or atrophy  Skin: warm and dry, no rash Neuro:  Strength and sensation are intact Psych: euthymic mood, full affect  Recent Labs: 11/16/2016: Hemoglobin 10.0; Platelets 182 03/21/2017: BUN 42; Creatinine, Ser 1.56; Potassium 5.0; Sodium 143    Lipid Panel    Component Value Date/Time   CHOL 159 11/21/2014 0820   TRIG 98 11/21/2014 0820   HDL 56 11/21/2014 0820   CHOLHDL 2.8 11/21/2014 0820   VLDL 20 11/21/2014 0820   LDLCALC 83 11/21/2014 0820      Wt Readings from Last 3 Encounters:  03/22/17 182 lb 6.4 oz (82.7 kg)  03/17/17 184 lb (83.5 kg)  03/15/17 186 lb 3.2 oz (84.5 kg)      Other studies Reviewed: Echocardiogram 12/18/2014  Left ventricle: The cavity size was normal. Posterior wall   thickness was increased in a pattern of mild LVH. Systolic   function was vigorous. The estimated ejection fraction was in the   range of 65% to 70%. Wall motion was normal; there were no   regional wall motion abnormalities. - Mitral valve: There was mild regurgitation. - Left atrium: The atrium was severely dilated. - Right ventricle: The cavity size was normal. Wall thickness was   normal. Systolic function was mildly reduced. - Tricuspid valve: There was moderate regurgitation. Peak RV-RA   gradient (S): 53 mm Hg. - Pulmonary arteries: PA peak pressure: 68 mm Hg (S). - Inferior vena cava: The vessel was dilated. The respirophasic   diameter changes were blunted (< 50%), consistent with elevated   central venous pressure.  ASSESSMENT AND PLAN:  1.  Chronic Atrial fib: She is rate controlled currently. Continue metoprolol, and apixaban  2.5 mg    2. Chronic LEE: She is better. She still complains of some fluid retention. I think a lot of this is dependent edema. I have asked her to begin wearing TED hose during the day and take them off at night. I will alternate her lasix to 40 mg one day and 60 mg the next. She will keep her appt with Dr. Duke Salviaandolph on 1.28/2019 for ongoing management.   I have reviewed her labs after she was on higher dose lasix. Creatinine was increased to 1.56 from 1.46 but improved from 1.61 Will need repeat labs after being seen by  Dr,[place] at her discretion.   3. Chronic Right sided CHF: As above. Continue daily weights and salt restriction.     Current medicines are reviewed at length with the patient today.    Labs/ tests ordered today include:  Bettey Mare. Liborio Nixon, ANP, AACC   03/22/2017 2:03 PM    [facility] [address], [place], [place] [address] Phone: [phone]; Fax: [phone]

## 2017-03-28 ENCOUNTER — Ambulatory Visit: Payer: Medicare Other | Admitting: Cardiovascular Disease

## 2017-04-10 ENCOUNTER — Encounter: Payer: Self-pay | Admitting: Cardiovascular Disease

## 2017-04-10 ENCOUNTER — Ambulatory Visit: Payer: Medicare Other | Admitting: Cardiovascular Disease

## 2017-04-10 VITALS — BP 120/68 | HR 100 | Ht 66.0 in | Wt 181.6 lb

## 2017-04-10 DIAGNOSIS — I482 Chronic atrial fibrillation, unspecified: Secondary | ICD-10-CM

## 2017-04-10 DIAGNOSIS — Z5181 Encounter for therapeutic drug level monitoring: Secondary | ICD-10-CM | POA: Diagnosis not present

## 2017-04-10 DIAGNOSIS — I5032 Chronic diastolic (congestive) heart failure: Secondary | ICD-10-CM | POA: Diagnosis not present

## 2017-04-10 DIAGNOSIS — I10 Essential (primary) hypertension: Secondary | ICD-10-CM

## 2017-04-10 MED ORDER — FUROSEMIDE 20 MG PO TABS: 20.0000 mg | ORAL_TABLET | Freq: Every day | ORAL | 3 refills | Status: DC

## 2017-04-10 NOTE — Progress Notes (Signed)
Cardiology Office Note   Date:  04/10/2017   ID:  Alyssa Cameron, DOB [birth year], MRN [id number]  PCP:  Shirline Frees, NP  Cardiologist:   Chilton Si, MD   Chief Complaint  Patient presents with  . Follow-up     History of Present Illness:  Alyssa Cameron is a 84 y.o. female with hypertension, severe pulmonary hypertension, heart failure, and chronic atrial fibrillation here for follow up.  Ms. Alyssa Cameron was first seen 11/2014 after moving to [facility] from [place].  She was diagnosed with atrial fibrillation in approximately 2013.  She presented with heart failure, was diuresed and started on metoprolol.  She had an echo 12/2014 that revealed LVEF 65-70% with mild MR, moderate TR and severe pulmonary hypertension (PASP 68 mmHg).  Her IVC was dilated and there was abnormal respiratory collapse.  Her lasix was increased and her breathing and lower extremity edema improved.  She declined further evaluation of her elevated pulmonary pressures, as she was feeling well.  Metoprolol was increased 01/2015 due to elevated blood pressure.  Since then she continues to do well.  Her blood pressure is usually around 127/70.  She has not noted any lower extremity edema, orthopnea or PND.    Since her last appointment Ms. Alyssa Cameron has been seen multiple times for lower extremity edema.  Lasix has been increased to 40mg  alternating with 60mg  daily.  She has also been taking  metolazone once per week.  She does note increased urination when she takes the metolazone.  On the day she supposed to take Lasix 60 mg she has been taking 40 in the morning and 20 and after her breathing has been stable and she denies any orthopnea or PND.  She denies chest pain or pressure.  She has not been getting any exercise lately.   Past Medical History:  Diagnosis Date  . A-fib (HCC)   . Arthritis   . CHF (congestive heart failure) (HCC)   . Diverticulitis   . Frequent UTI   . History of colon polyps   . Hx of colonic polyps    2013 - 2  tubular adenoma sigmoid and Transverse polyp  . Hypertension   . Internal hemorrhoids   . Pulmonary hypertension (HCC) 01/07/2015  . Spinal stenosis   . Urine incontinence     Past Surgical History:  Procedure Laterality Date  . APPENDECTOMY  1941  . BREAST BIOPSY  2010  . CATARACT EXTRACTION    . GALLBLADDER SURGERY  1970  . TONSILLECTOMY AND ADENOIDECTOMY  1941  . VAGINAL HYSTERECTOMY       Current Outpatient Medications  Medication Sig Dispense Refill  . acetaminophen (TYLENOL) 500 MG tablet Take 500-1,000 mg by mouth every 8 (eight) hours as needed for moderate pain.     Marland Kitchen apixaban (ELIQUIS) 2.5 MG TABS tablet Take 1 tablet (2.5 mg total) by mouth 2 (two) times daily. 60 tablet   . CRANBERRY PO Take 1 tablet by mouth daily.    . furosemide (LASIX) 40 MG tablet Take 40 mg by mouth daily. Take with the 20 mg tablet 90 tablet 3  . lisinopril (PRINIVIL,ZESTRIL) 20 MG tablet Take 1 tablet (20 mg total) by mouth daily. 90 tablet 3  . metolazone (ZAROXOLYN) 2.5 MG tablet Take 1 tablet (2.5 mg total) by mouth once a week. Take weekly on tuesday 4 tablet 3  . metoprolol succinate (TOPROL-XL) 100 MG 24 hr tablet TAKE 1 TABLET BY MOUTH  DAILY WITH BREAKFAST 90 tablet  1  . pantoprazole (PROTONIX) 40 MG tablet TAKE 1 TABLET BY MOUTH  DAILY 90 tablet 1  . potassium chloride SA (K-DUR,KLOR-CON) 20 MEQ tablet TAKE 1 TABLET BY MOUTH  EVERY DAY 90 tablet 1  . traZODone (DESYREL) 50 MG tablet Take 0.5-1 tablets (25-50 mg total) by mouth at bedtime as needed for sleep. 90 tablet 0  . furosemide (LASIX) 20 MG tablet Take 1 tablet (20 mg total) by mouth daily. Take with the 40 mg tablet 90 tablet 3   No current facility-administered medications for this visit.     Allergies:   Demerol [meperidine] and Vicodin [hydrocodone-acetaminophen]    Social History:  The patient  reports that  has never smoked. she has never used smokeless tobacco. She reports that she does not drink alcohol or use drugs.     Family History:  The patient's family history includes Breast cancer in her unknown relative; Heart failure in her father and mother; Lung cancer in her unknown relative; Stroke in her unknown relative.    ROS:  Please see the history of present illness.  All other systems are reviewed and were positive for knee pain due to arthritis.  Otherwise review of systems was negative.    PHYSICAL EXAM: VS:  BP 120/68   Pulse 100   Ht 5\' 6"  (1.676 m)   Wt 181 lb 9.6 oz (82.4 kg)   SpO2 97%   BMI 29.31 kg/m  , BMI Body mass index is 29.31 kg/m. GENERAL:  Well appearing HEENT:  Pupils equal round and reactive, fundi not visualized, oral mucosa unremarkable NECK:  No JVD. Waveform within normal limits, carotid upstroke brisk and symmetric, no bruits LUNGS:  Clear to auscultation bilaterally.  No crackles, wheezes or rhonchi HEART:  Irregularly irregular.  PMI not displaced or sustained,S1 and S2 within normal limits, no S3, no S4, no clicks, no rubs, III/VI holosystolic murmur at the apex.  II/VI systolic murmur at the LUSB. ABD:  Flat, positive bowel sounds normal in frequency in pitch, no bruits, no rebound, no guarding, no midline pulsatile mass, no hepatomegaly, no splenomegaly EXT:  2 plus pulses throughout, 1+ pitting edema to mid tibia bilaterally, no cyanosis no clubbing SKIN:  No rashes no nodules NEURO:  Cranial nerves II through XII grossly intact, motor grossly intact throughout PSYCH:  Cognitively intact, oriented to person place and time   EKG:  EKG is not ordered today. 11/18/15: Atrial fibrillation.  Rate 95 bpm.  Inferolateral T wave inversions.  The ekg ordered 11/17/14 demonstrates atrial fibrillation rate 98 bpm.  Low voltage precordial leads.   05/13/16: Atrial fibrillation.  Rate 79 bpm. 11/16/16: Atrial fibrillation.  Rate 72 bpm. Low voltage.    Recent Labs: 11/16/2016: Hemoglobin 10.0; Platelets 182 03/21/2017: BUN 42; Creatinine, Ser 1.56; Potassium 5.0; Sodium 143     Lipid Panel    Component Value Date/Time   CHOL 159 11/21/2014 0820   TRIG 98 11/21/2014 0820   HDL 56 11/21/2014 0820   CHOLHDL 2.8 11/21/2014 0820   VLDL 20 11/21/2014 0820   LDLCALC 83 11/21/2014 0820      Wt Readings from Last 3 Encounters:  04/10/17 181 lb 9.6 oz (82.4 kg)  03/22/17 182 lb 6.4 oz (82.7 kg)  03/17/17 184 lb (83.5 kg)     Other studies Reviewed: Additional studies/ records that were reviewed today include:  Review of the above records demonstrates:  Please see elsewhere in the note.   ASSESSMENT AND PLAN:  #  Chronic atrial fibrillation: continue metoprolol and Eliquis.   # Hypertension: BP well-controlled today. She did not tolerate higher doses of amlodipine due to swelling.  Continue amlodipine, lisinopril, and metoprolol.  # Pulmonary hypertension: # Acute on chronic diastolic heart failure: Increase lasix to 60mg  daily.  Continue metolazone once per week.  Check BMP in one week.  Etiology unknown.  She prefers to avoid any further work up given that she is feeling well.    #  CV disease prevention: Managed with diet.  No repeat lipids due to age.  Current medicines are reviewed at length with the patient today.  The patient does not have concerns regarding medicines.  The following changes have been made:  Increase lasix to 60mg  daily.  Labs/ tests ordered today include:   Orders Placed This Encounter  Procedures  . Basic metabolic panel     Disposition:   FU with Dr. Elmarie Shileyiffany C. [place] in 1 month.   Signed, Chilton Siiffany Clyde, MD  04/10/2017 5:02 PM    [facility]

## 2017-04-10 NOTE — Patient Instructions (Signed)
Medication Instructions:  INCREASE YOUR FUROSEMIDE TO 60 MG DAILY   Labwork: BMET IN 1 WEEK  Testing/Procedures: NONE  Follow-Up: Your physician recommends that you schedule a follow-up appointment in: 1 MONTH OV  If you need a refill on your cardiac medications before your next appointment, please call your pharmacy.

## 2017-04-19 LAB — BASIC METABOLIC PANEL
BUN/Creatinine Ratio: 23 (ref 12–28)
BUN: 35 mg/dL (ref 10–36)
CHLORIDE: 103 mmol/L (ref 96–106)
CO2: 19 mmol/L — AB (ref 20–29)
CREATININE: 1.51 mg/dL — AB (ref 0.57–1.00)
Calcium: 9.7 mg/dL (ref 8.7–10.3)
GFR calc Af Amer: 33 mL/min/{1.73_m2} — ABNORMAL LOW (ref >59)
GFR calc non Af Amer: 29 mL/min/{1.73_m2} — ABNORMAL LOW (ref >59)
GLUCOSE: 158 mg/dL — AB (ref 65–99)
Potassium: 4.7 mmol/L (ref 3.5–5.2)
SODIUM: 143 mmol/L (ref 134–144)

## 2017-05-09 ENCOUNTER — Encounter: Payer: Self-pay | Admitting: Cardiovascular Disease

## 2017-05-09 ENCOUNTER — Ambulatory Visit: Payer: Medicare Other | Admitting: Cardiovascular Disease

## 2017-05-09 VITALS — BP 125/67 | HR 75 | Ht 62.0 in | Wt 176.2 lb

## 2017-05-09 DIAGNOSIS — I5032 Chronic diastolic (congestive) heart failure: Secondary | ICD-10-CM | POA: Diagnosis not present

## 2017-05-09 DIAGNOSIS — I10 Essential (primary) hypertension: Secondary | ICD-10-CM

## 2017-05-09 DIAGNOSIS — I482 Chronic atrial fibrillation, unspecified: Secondary | ICD-10-CM

## 2017-05-09 DIAGNOSIS — R609 Edema, unspecified: Secondary | ICD-10-CM

## 2017-05-09 NOTE — Patient Instructions (Addendum)
Medication Instructions:  Your physician recommends that you continue on your current medications as directed. Please refer to the Current Medication list given to you today.  Labwork: NONE  Testing/Procedures: NONE  Follow-Up: Your physician recommends that you schedule a follow-up appointment in: 4 MONTH OV  If you need a refill on your cardiac medications before your next appointment, please call your pharmacy.   

## 2017-05-09 NOTE — Progress Notes (Signed)
Cardiology Office Note   Date:  05/09/2017   ID:  Alyssa Cameron, DOB 10/01/1918, MRN [id number]  PCP:  Shirline Frees, NP  Cardiologist:   Chilton Si, MD   Chief Complaint  Patient presents with  . Follow-up     History of Present Illness:  Alyssa Cameron is a 84 y.o. female with hypertension, severe pulmonary hypertension, heart failure, and chronic atrial fibrillation here for follow up.  Alyssa Cameron was first seen 11/2014 after moving to [facility] from [place].  She was diagnosed with atrial fibrillation in approximately 2013.  She presented with heart failure, was diuresed and started on metoprolol.  She had an echo 12/2014 that revealed LVEF 65-70% with mild MR, moderate TR and severe pulmonary hypertension (PASP 68 mmHg).  Her IVC was dilated and there was abnormal respiratory collapse.  Her lasix was increased and her breathing and lower extremity edema improved.  She declined further evaluation of her elevated pulmonary pressures, as she was feeling well.  Metoprolol was increased 01/2015 due to elevated blood pressure.  Since then she continues to do well.  Her blood pressure is usually around 127/70.  She has not noted any lower extremity edema, orthopnea or PND.    At her last appointment Alyssa Cameron was struggling with lower extremity edema.  Lasix was increased to 60mg  daily.  She takes metolazone once per week.  She has been doing exceptionally well.  She has no edema or shortness of breath.  Her BP at home has been in the 120s-130s.  She is feeling well and without complaint.  She has follow up labs that showed stable kidney function and electrolytes.    Past Medical History:  Diagnosis Date  . A-fib (HCC)   . Arthritis   . CHF (congestive heart failure) (HCC)   . Diverticulitis   . Frequent UTI   . History of colon polyps   . Hx of colonic polyps    2013 - 2 tubular adenoma sigmoid and Transverse polyp  . Hypertension   . Internal hemorrhoids   . Pulmonary hypertension (HCC)  01/07/2015  . Spinal stenosis   . Urine incontinence     Past Surgical History:  Procedure Laterality Date  . APPENDECTOMY  1941  . BREAST BIOPSY  2010  . CATARACT EXTRACTION    . GALLBLADDER SURGERY  1970  . TONSILLECTOMY AND ADENOIDECTOMY  1941  . VAGINAL HYSTERECTOMY       Current Outpatient Medications  Medication Sig Dispense Refill  . acetaminophen (TYLENOL) 500 MG tablet Take 500-1,000 mg by mouth every 8 (eight) hours as needed for moderate pain.     Marland Kitchen apixaban (ELIQUIS) 2.5 MG TABS tablet Take 1 tablet (2.5 mg total) by mouth 2 (two) times daily. 60 tablet   . CRANBERRY PO Take 1 tablet by mouth daily.    . furosemide (LASIX) 20 MG tablet Take 1 tablet (20 mg total) by mouth daily. Take with the 40 mg tablet 90 tablet 3  . furosemide (LASIX) 40 MG tablet Take 40 mg by mouth daily. Take with the 20 mg tablet 90 tablet 3  . lisinopril (PRINIVIL,ZESTRIL) 20 MG tablet Take 1 tablet (20 mg total) by mouth daily. 90 tablet 3  . metolazone (ZAROXOLYN) 2.5 MG tablet Take 1 tablet (2.5 mg total) by mouth once a week. Take weekly on tuesday 4 tablet 3  . metoprolol succinate (TOPROL-XL) 100 MG 24 hr tablet TAKE 1 TABLET BY MOUTH  DAILY WITH BREAKFAST 90 tablet  1  . pantoprazole (PROTONIX) 40 MG tablet TAKE 1 TABLET BY MOUTH  DAILY 90 tablet 1  . potassium chloride SA (K-DUR,KLOR-CON) 20 MEQ tablet TAKE 1 TABLET BY MOUTH  EVERY DAY 90 tablet 1  . traZODone (DESYREL) 50 MG tablet Take 0.5-1 tablets (25-50 mg total) by mouth at bedtime as needed for sleep. 90 tablet 0   No current facility-administered medications for this visit.     Allergies:   Demerol [meperidine] and Vicodin [hydrocodone-acetaminophen]    Social History:  The patient  reports that  has never smoked. she has never used smokeless tobacco. She reports that she does not drink alcohol or use drugs.   Family History:  The patient's family history includes Breast cancer in her unknown relative; Heart failure in her  father and mother; Lung cancer in her unknown relative; Stroke in her unknown relative.    ROS:  Please see the history of present illness.  All other systems are reviewed and were positive for knee pain due to arthritis.  Otherwise review of systems was negative.    PHYSICAL EXAM: VS:  BP 125/67   Pulse 75   Ht 5\' 2"  (1.575 m)   Wt 176 lb 3.2 oz (79.9 kg)   BMI 32.23 kg/m  , BMI Body mass index is 32.23 kg/m. GENERAL:  Well appearing.  No acute distress.  HEENT: Pupils equal round and reactive, fundi not visualized, oral mucosa unremarkable NECK:  No jugular venous distention, waveform within normal limits, carotid upstroke brisk and symmetric, no bruits LUNGS:  Clear to auscultation bilaterally HEART:  Irregularly irregular.  PMI not displaced or sustained,S1 and S2 within normal limits, no S3, no S4, no clicks, no rubs, no murmurs ABD:  Flat, positive bowel sounds normal in frequency in pitch, no bruits, no rebound, no guarding, no midline pulsatile mass, no hepatomegaly, no splenomegaly EXT:  2 plus pulses throughout, no edema, no cyanosis no clubbing SKIN:  No rashes no nodules NEURO:  Cranial nerves II through XII grossly intact, motor grossly intact throughout PSYCH:  Cognitively intact, oriented to person place and time   EKG:  EKG is not ordered today. 11/18/15: Atrial fibrillation.  Rate 95 bpm.  Inferolateral T wave inversions.  The ekg ordered 11/17/14 demonstrates atrial fibrillation rate 98 bpm.  Low voltage precordial leads.   05/13/16: Atrial fibrillation.  Rate 79 bpm. 11/16/16: Atrial fibrillation.  Rate 72 bpm. Low voltage.    Recent Labs: 11/16/2016: Hemoglobin 10.0; Platelets 182 04/18/2017: BUN 35; Creatinine, Ser 1.51; Potassium 4.7; Sodium 143    Lipid Panel    Component Value Date/Time   CHOL 159 11/21/2014 0820   TRIG 98 11/21/2014 0820   HDL 56 11/21/2014 0820   CHOLHDL 2.8 11/21/2014 0820   VLDL 20 11/21/2014 0820   LDLCALC 83 11/21/2014 0820      Wt  Readings from Last 3 Encounters:  05/09/17 176 lb 3.2 oz (79.9 kg)  04/10/17 181 lb 9.6 oz (82.4 kg)  03/22/17 182 lb 6.4 oz (82.7 kg)     Other studies Reviewed: Additional studies/ records that were reviewed today include:  Review of the above records demonstrates:  Please see elsewhere in the note.   ASSESSMENT AND PLAN:  # Chronic atrial fibrillation: Continue metoprolol and Eliquis.   # Hypertension: BP well-controlled on current doses of amlodipine, lisinopril, and metoprolol.  # Pulmonary hypertension: # Acute on chronic diastolic heart failure: She is euvolemic.  Continue metolazone once per week and lasix 60mg   daily.   #  CV disease prevention: Managed with diet.  No repeat lipids due to age.  Current medicines are reviewed at length with the patient today.  The patient does not have concerns regarding medicines.  The following changes have been made:  none  Labs/ tests ordered today include:   No orders of the defined types were placed in this encounter.    Disposition:   FU with Dr. Elmarie Shiley C. Cowden in 4 months.   Signed, Chilton Si, MD  05/09/2017 11:44 AM    [facility]

## 2017-05-24 ENCOUNTER — Ambulatory Visit: Payer: Medicare Other | Admitting: Family Medicine

## 2017-05-24 ENCOUNTER — Ambulatory Visit: Payer: Self-pay | Admitting: *Deleted

## 2017-05-24 ENCOUNTER — Encounter: Payer: Self-pay | Admitting: Family Medicine

## 2017-05-24 VITALS — BP 110/70 | HR 84 | Temp 98.3°F | Wt 170.8 lb

## 2017-05-24 DIAGNOSIS — L299 Pruritus, unspecified: Secondary | ICD-10-CM

## 2017-05-24 MED ORDER — TRIAMCINOLONE ACETONIDE 0.1 % EX CREA: TOPICAL_CREAM | CUTANEOUS | 1 refills | Status: DC

## 2017-05-24 NOTE — Patient Instructions (Signed)
Consider once daily Claritan or Zyrtec or Allegra for the itching

## 2017-05-24 NOTE — Progress Notes (Signed)
Subjective:     Patient ID: Alyssa Cameron, female   DOB: 18-Dec-1918, 84 y.o.   MRN: [id number]  HPI Patient seen with chief complaint of pruritus. She started about 4 days ago. Denies any change of soaps or detergents. No new medications. She's not noted any rash. No nausea or vomiting. No fever. She tried some type of moisturizing lotion without much improvement. Her itching mostly involves the neck, anterior trunk, abdomen, and back. Sparing of the lower extremities and face. Uses Dove soap.  Past Medical History:  Diagnosis Date  . A-fib (HCC)   . Arthritis   . CHF (congestive heart failure) (HCC)   . Diverticulitis   . Frequent UTI   . History of colon polyps   . Hx of colonic polyps    2013 - 2 tubular adenoma sigmoid and Transverse polyp  . Hypertension   . Internal hemorrhoids   . Pulmonary hypertension (HCC) 01/07/2015  . Spinal stenosis   . Urine incontinence    Past Surgical History:  Procedure Laterality Date  . APPENDECTOMY  1941  . BREAST BIOPSY  2010  . CATARACT EXTRACTION    . GALLBLADDER SURGERY  1970  . TONSILLECTOMY AND ADENOIDECTOMY  1941  . VAGINAL HYSTERECTOMY      reports that  has never smoked. she has never used smokeless tobacco. She reports that she does not drink alcohol or use drugs. family history includes Breast cancer in her unknown relative; Heart failure in her father and mother; Lung cancer in her unknown relative; Stroke in her unknown relative. Allergies  Allergen Reactions  . Demerol [Meperidine] Nausea Only  . Vicodin [Hydrocodone-Acetaminophen] Nausea And Vomiting     Review of Systems  Constitutional: Negative for chills and fever.  Respiratory: Negative for shortness of breath.   Cardiovascular: Negative for chest pain.  Gastrointestinal: Negative for abdominal pain.  Skin: Negative for rash.       Objective:   Physical Exam  Constitutional: She appears well-developed and well-nourished.  Cardiovascular: Normal rate.   Pulmonary/Chest: Effort normal and breath sounds normal. No respiratory distress. She has no wheezes. She has no rales.  Skin: No rash noted.  Slightly dry skin but no visible rash. She has couple of superficial excoriations upper chest. No hives. No pustules.       Assessment:     Patient presents with 4 day history of some generalized pruritis.  No visible rash. Does have slightly dry skin but not clear if this is the only culprit.  Doubt drug related with no rash.      Plan:     -She will try over-the-counter plain Claritin 10 mg daily -Triamcinolone 0.1% cream compounded one-to-one with Eucerin and apply twice daily as needed -Continue Dove soap -Limit bathing time -Touch base if not improving in one week  Alyssa CoveyBruce W Alyssa Skidmore MD [facility]

## 2017-05-24 NOTE — Telephone Encounter (Signed)
Contacted pt because she is having a lot of itching that started 3-4 days ago; she states her skin is red and the itching from her head to her torso; she states that she has been using the bath and body works to help with itching; she does not have a rash; nurse triage initiated and recommendations made per protocol to include seeing a physician within 24 hours; pt previously scheduled, per agent Marylu LundJanet, with [facility], today at 1715; pt verbalizes understanding.  Reason for Disposition . [1] MODERATE-SEVERE widespread itching (i.e., interferes with sleep, normal activities or school) AND [2] not improved after 24 hours of itching Care Advice  Answer Assessment - Initial Assessment Questions 1. DESCRIPTION: "Describe the itching you are having."     "Just itching want to scratch" 2. SEVERITY: "How bad is it?"    - MILD - doesn't interfere with normal activities   - MODERATE-SEVERE: interferes with work, school, sleep, or other activities    Moderate to severe 3. SCRATCHING: "Are there any scratch marks? Bleeding?"     Yes especially on chest 4. ONSET: "When did this begin?"      05/20/17 5. CAUSE: "What do you think is causing the itching?" (ask about swimming pools, pollen, animals, soaps, etc.)     Not sure 6. OTHER SYMPTOMS: "Do you have any other symptoms?"      no 7. PREGNANCY: "Is there any chance you are pregnant?" "When was your last menstrual period?"     no  Protocols used: ITCHING [facility]- [id number]-AH

## 2017-05-24 NOTE — Telephone Encounter (Signed)
Left message on voice mail [phone]; unable to complete nurse triage for assessment of symptoms

## 2017-06-06 ENCOUNTER — Telehealth: Payer: Self-pay | Admitting: Adult Health

## 2017-06-06 NOTE — Telephone Encounter (Signed)
Called pt to find out symptoms. Pt was seen in office for this sx.

## 2017-06-06 NOTE — Telephone Encounter (Signed)
Copied from CRM [phone]. Topic: Quick Communication - See Telephone Encounter >> Jun 06, 2017  9:42 AM Waymon AmatoBurton, Donna F wrote: CRM for notification. See Telephone encounter for: 06/06/17.pt is needing to talk with provider because the pt states that the kenalog cream and the over the counter claratin is not working for the rash and would like something different called in and does she need to still use the claratin  Best number [phone]  [facility]

## 2017-06-06 NOTE — Telephone Encounter (Signed)
Patient called and says the kenalog cream and claritin is not working for the rash. Can something else be called in and does she need to continue taking the Claritin.

## 2017-06-06 NOTE — Telephone Encounter (Signed)
Pt seen Dr. Caryl NeverBurchette.

## 2017-06-07 ENCOUNTER — Other Ambulatory Visit: Payer: Self-pay | Admitting: Adult Health

## 2017-06-07 DIAGNOSIS — G8929 Other chronic pain: Secondary | ICD-10-CM

## 2017-06-07 DIAGNOSIS — M25562 Pain in left knee: Principal | ICD-10-CM

## 2017-06-07 DIAGNOSIS — M25561 Pain in right knee: Principal | ICD-10-CM

## 2017-06-07 DIAGNOSIS — G4700 Insomnia, unspecified: Secondary | ICD-10-CM

## 2017-06-07 NOTE — Telephone Encounter (Signed)
Left message on machine for patient to return our call.  CRM created 

## 2017-06-07 NOTE — Telephone Encounter (Signed)
Could try Hydroxyzine 25 mg po q 8 hours prn pruritis #30 with no refills- but caution this could cause some sedation.

## 2017-06-07 NOTE — Telephone Encounter (Signed)
Sent to the pharmacy by e-scribe. 

## 2017-06-11 ENCOUNTER — Other Ambulatory Visit: Payer: Self-pay | Admitting: Physician Assistant

## 2017-06-11 ENCOUNTER — Other Ambulatory Visit: Payer: Self-pay | Admitting: Adult Health

## 2017-06-11 DIAGNOSIS — K219 Gastro-esophageal reflux disease without esophagitis: Secondary | ICD-10-CM

## 2017-06-12 NOTE — Telephone Encounter (Signed)
Refill Request.  

## 2017-06-12 NOTE — Telephone Encounter (Signed)
REFILL 

## 2017-06-14 NOTE — Telephone Encounter (Signed)
Left message on machine for patient to return our call 

## 2017-06-21 ENCOUNTER — Ambulatory Visit: Payer: Medicare Other | Admitting: Adult Health

## 2017-06-21 ENCOUNTER — Encounter: Payer: Self-pay | Admitting: Adult Health

## 2017-06-21 VITALS — BP 120/68 | Temp 98.0°F | Wt 171.0 lb

## 2017-06-21 DIAGNOSIS — L299 Pruritus, unspecified: Secondary | ICD-10-CM

## 2017-06-21 MED ORDER — PREDNISONE 10 MG PO TABS: 10.0000 mg | ORAL_TABLET | Freq: Every day | ORAL | 0 refills | Status: DC

## 2017-06-21 NOTE — Progress Notes (Signed)
Subjective:    Patient ID: Alyssa Cameron, female    DOB: 01-Nov-1918, 84 y.o.   MRN: [id number]  HPI 84 year old female who  has a past medical history of A-fib (HCC), Arthritis, CHF (congestive heart failure) (HCC), Diverticulitis, Frequent UTI, History of colon polyps, colonic polyps, Hypertension, Internal hemorrhoids, Pulmonary hypertension (HCC) (01/07/2015), Spinal stenosis, and Urine incontinence.  She presents to office today for follow-up regarding right is.  She was first seen about a month ago by another primary care provider in this office.  At this time she denied any changes in soaps or detergents.  No new medications she denied any nausea or vomiting or fever.  She had not noticed any rash and she had tried moisturizer without improvement.  During that visit she was prescribed Triamcinolone 0.1% cream compounded one-to-one with Eucerin.  Today in the office she reports that she has been using the lotion as directed but continues to have a full body itching sensation.  She has started using over-the-counter Claritin 10 mg daily and notices some relief from the itching sensation after she takes this medication this medication usually wears off around the early evening and she has not been able to sleep because of the itching.  Itching is sometimes described as a "pinprick".  She denies any rashes this entire ordeal.  Review of Systems See HPI   Past Medical History:  Diagnosis Date  . A-fib (HCC)   . Arthritis   . CHF (congestive heart failure) (HCC)   . Diverticulitis   . Frequent UTI   . History of colon polyps   . Hx of colonic polyps    2013 - 2 tubular adenoma sigmoid and Transverse polyp  . Hypertension   . Internal hemorrhoids   . Pulmonary hypertension (HCC) 01/07/2015  . Spinal stenosis   . Urine incontinence     Social History   Socioeconomic History  . Marital status: Widowed    Spouse name: Not on file  . Number of children: Not on file  . Years of education:  Not on file  . Highest education level: Not on file  Occupational History  . Not on file  Social Needs  . Financial resource strain: Not on file  . Food insecurity:    Worry: Not on file    Inability: Not on file  . Transportation needs:    Medical: Not on file    Non-medical: Not on file  Tobacco Use  . Smoking status: Never Smoker  . Smokeless tobacco: Never Used  Substance and Sexual Activity  . Alcohol use: No    Alcohol/week: 0.0 oz  . Drug use: No  . Sexual activity: Not on file  Lifestyle  . Physical activity:    Days per week: Not on file    Minutes per session: Not on file  . Stress: Not on file  Relationships  . Social connections:    Talks on phone: Not on file    Gets together: Not on file    Attends religious service: Not on file    Active member of club or organization: Not on file    Attends meetings of clubs or organizations: Not on file    Relationship status: Not on file  . Intimate partner violence:    Fear of current or ex partner: Not on file    Emotionally abused: Not on file    Physically abused: Not on file    Forced sexual activity: Not on file  Other Topics Concern  . Not on file  Social History Narrative   Retired from [employer] in [place] and moved here from [place]   2 daughter and 2 sons   Likes to read and do puzzles    Past Surgical History:  Procedure Laterality Date  . APPENDECTOMY  1941  . BREAST BIOPSY  2010  . CATARACT EXTRACTION    . GALLBLADDER SURGERY  1970  . TONSILLECTOMY AND ADENOIDECTOMY  1941  . VAGINAL HYSTERECTOMY      Family History  Problem Relation Age of Onset  . Heart failure Mother        59  . Heart failure Father        64  . Breast cancer Unknown   . Lung cancer Unknown   . Stroke Unknown     Allergies  Allergen Reactions  . Demerol [Meperidine] Nausea Only  . Vicodin [Hydrocodone-Acetaminophen] Nausea And Vomiting    Current Outpatient Medications on File Prior to Visit  Medication Sig  Dispense Refill  . acetaminophen (TYLENOL) 500 MG tablet Take 500-1,000 mg by mouth every 8 (eight) hours as needed for moderate pain.     Marland Kitchen apixaban (ELIQUIS) 2.5 MG TABS tablet Take 1 tablet (2.5 mg total) by mouth 2 (two) times daily. 60 tablet   . CRANBERRY PO Take 1 tablet by mouth daily.    . furosemide (LASIX) 20 MG tablet Take 1 tablet (20 mg total) by mouth daily. Take with the 40 mg tablet 90 tablet 3  . furosemide (LASIX) 40 MG tablet Take 40 mg by mouth daily. Take with the 20 mg tablet 90 tablet 3  . lisinopril (PRINIVIL,ZESTRIL) 20 MG tablet Take 1 tablet (20 mg total) by mouth daily. 90 tablet 3  . metoprolol succinate (TOPROL-XL) 100 MG 24 hr tablet TAKE 1 TABLET BY MOUTH  DAILY WITH BREAKFAST 90 tablet 1  . pantoprazole (PROTONIX) 40 MG tablet TAKE 1 TABLET BY MOUTH  DAILY 90 tablet 1  . potassium chloride SA (K-DUR,KLOR-CON) 20 MEQ tablet TAKE 1 TABLET BY MOUTH  EVERY DAY 90 tablet 1  . traZODone (DESYREL) 50 MG tablet TAKE 0.5-1 TABLETS (25-50 MG TOTAL) BY MOUTH AT BEDTIME AS NEEDED FOR SLEEP. 90 tablet 0  . triamcinolone cream (KENALOG) 0.1 % Compound 1:1 with eucerin cream and apply twice daily as needed. 453 g 1  . metolazone (ZAROXOLYN) 2.5 MG tablet Take 1 tablet (2.5 mg total) by mouth once a week. Take weekly on tuesday 4 tablet 3   No current facility-administered medications on file prior to visit.     BP 120/68   Temp 98 F (36.7 C) (Oral)   Wt 171 lb (77.6 kg)   BMI 31.28 kg/m       Objective:   Physical Exam  Constitutional: She is oriented to person, place, and time. She appears well-developed and well-nourished. No distress.  Eyes: Pupils are equal, round, and reactive to light. Conjunctivae and EOM are normal. Right eye exhibits no discharge. Left eye exhibits no discharge. No scleral icterus.  Cardiovascular: Normal rate, regular rhythm, normal heart sounds and intact distal pulses. Exam reveals no gallop and no friction rub.  No murmur  heard. Pulmonary/Chest: Effort normal and breath sounds normal. No respiratory distress. She has no wheezes. She has no rales. She exhibits no tenderness.  Neurological: She is alert and oriented to person, place, and time.  Skin: Skin is warm and dry. No rash noted. She is not  diaphoretic. No erythema. No pallor.  Is no visible rash.  Skin does not appear dry at this time.  No pustules or vesicles noted  Psychiatric: She has a normal mood and affect. Her behavior is normal. Judgment and thought content normal.  Nursing note and vitals reviewed.     Assessment & Plan:  1. Pruritic condition -No yellowing of the skin or eyes -Is more histamine response especially since Claritin seems to help.  Will prescribe short course of oral prednisone.  He can switch lotions I advised either Aveeno, Cetaphil, or Goldbond Follow-up if no improvement in 1 week  Shirline Frees, NP

## 2017-06-22 ENCOUNTER — Other Ambulatory Visit: Payer: Self-pay

## 2017-06-22 MED ORDER — METOLAZONE 2.5 MG PO TABS: 2.5000 mg | ORAL_TABLET | ORAL | 3 refills | Status: DC

## 2017-06-22 NOTE — Progress Notes (Signed)
Jodelle GrossLawrence, Kathryn M, NP  Alyson InglesBroome, Daelynn Blower L, LPN She may have refill on this with 2 more refills.   Previous Messages ----- Message -----  From: Shirline FreesNafziger, Cory, NP  Sent: 06/21/2017  5:19 PM  To: Jodelle GrossKathryn M Lawrence, NP   Anastasia PallHi Kathyrn,   I saw a mutual patient today Tonny Branch( Jatoria Felkins). She mentioned to me that she was out of Zaroxolyn and could not remember if she had sent a message for it to be refilled. I told her I would reach out to you for her.   Thanks   [employer]

## 2017-06-27 ENCOUNTER — Telehealth: Payer: Self-pay | Admitting: Family Medicine

## 2017-06-27 ENCOUNTER — Telehealth: Payer: Self-pay | Admitting: Adult Health

## 2017-06-27 NOTE — Telephone Encounter (Signed)
Copied from CRM [id number]. Topic: Quick Communication - See Telephone Encounter >> Jun 27, 2017  9:03 AM Diana EvesHoyt, Maryann B wrote: CRM for notification. See Telephone encounter for: 06/27/17.  Pt was seen on 06/21/17 for itch. And she states it is a little better but not by much. She took her last prednisone today and is wondering what steps she needs to take next.

## 2017-06-27 NOTE — Telephone Encounter (Signed)
Copied from CRM [id number]. Topic: Quick Communication - See Telephone Encounter >> Jun 27, 2017  9:03 AM Diana EvesHoyt, Maryann B wrote: CRM for notification. See Telephone encounter for: 06/27/17.  Pt was seen on 06/21/17 for itch. And she states it is a little better but not by much. She took her last prednisone today and is wondering what steps she needs to take next.  >> Jun 27, 2017  3:29 PM Terisa Starraylor, Brittany L wrote: Patient would like to be called asap

## 2017-06-27 NOTE — Telephone Encounter (Signed)
Patient has not returned call as of 06/27/17 

## 2017-06-28 NOTE — Telephone Encounter (Signed)
Returning call. No answer. Left message to call back

## 2017-07-28 ENCOUNTER — Encounter: Payer: Self-pay | Admitting: Adult Health

## 2017-07-28 ENCOUNTER — Ambulatory Visit (INDEPENDENT_AMBULATORY_CARE_PROVIDER_SITE_OTHER)
Admission: RE | Admit: 2017-07-28 | Discharge: 2017-07-28 | Disposition: A | Discharge status: Home / Self Care | Payer: Medicare Other | Source: Ambulatory Visit | Attending: Adult Health | Admitting: Adult Health

## 2017-07-28 ENCOUNTER — Ambulatory Visit: Payer: Medicare Other | Admitting: Adult Health

## 2017-07-28 VITALS — BP 112/70 | Temp 98.3°F | Wt 171.0 lb

## 2017-07-28 DIAGNOSIS — L299 Pruritus, unspecified: Secondary | ICD-10-CM | POA: Diagnosis not present

## 2017-07-28 LAB — HEPATIC FUNCTION PANEL
ALBUMIN: 4.2 g/dL (ref 3.5–5.2)
ALT: 7 U/L (ref 0–35)
AST: 15 U/L (ref 0–37)
Alkaline Phosphatase: 141 U/L — ABNORMAL HIGH (ref 39–117)
Bilirubin, Direct: 0.3 mg/dL (ref 0.0–0.3)
Total Bilirubin: 0.8 mg/dL (ref 0.2–1.2)
Total Protein: 6.7 g/dL (ref 6.0–8.3)

## 2017-07-28 LAB — CBC WITH DIFFERENTIAL/PLATELET
BASOS PCT: 1.3 % (ref 0.0–3.0)
Basophils Absolute: 0.1 10*3/uL (ref 0.0–0.1)
EOS PCT: 1.6 % (ref 0.0–5.0)
Eosinophils Absolute: 0.1 10*3/uL (ref 0.0–0.7)
HCT: 31 % — ABNORMAL LOW (ref 36.0–46.0)
Hemoglobin: 10 g/dL — ABNORMAL LOW (ref 12.0–15.0)
LYMPHS ABS: 0.8 10*3/uL (ref 0.7–4.0)
Lymphocytes Relative: 14 % (ref 12.0–46.0)
MCHC: 32.2 g/dL (ref 30.0–36.0)
MCV: 87.4 fl (ref 78.0–100.0)
MONOS PCT: 10 % (ref 3.0–12.0)
Monocytes Absolute: 0.6 10*3/uL (ref 0.1–1.0)
NEUTROS ABS: 4.1 10*3/uL (ref 1.4–7.7)
Neutrophils Relative %: 73.1 % (ref 43.0–77.0)
PLATELETS: 168 10*3/uL (ref 150.0–400.0)
RBC: 3.55 Mil/uL — ABNORMAL LOW (ref 3.87–5.11)
RDW: 20.3 % — AB (ref 11.5–15.5)
WBC: 5.6 10*3/uL (ref 4.0–10.5)

## 2017-07-28 LAB — BASIC METABOLIC PANEL
BUN: 50 mg/dL — AB (ref 6–23)
CALCIUM: 9.4 mg/dL (ref 8.4–10.5)
CO2: 28 mEq/L (ref 19–32)
CREATININE: 1.72 mg/dL — AB (ref 0.40–1.20)
Chloride: 103 mEq/L (ref 96–112)
GFR: 29.19 mL/min — ABNORMAL LOW (ref >60.00)
Glucose, Bld: 151 mg/dL — ABNORMAL HIGH (ref 70–99)
Potassium: 3.9 mEq/L (ref 3.5–5.1)
Sodium: 141 mEq/L (ref 135–145)

## 2017-07-28 LAB — TSH: TSH: 1.94 u[IU]/mL (ref 0.35–4.50)

## 2017-07-28 MED ORDER — PAROXETINE HCL 10 MG PO TABS: 10.0000 mg | ORAL_TABLET | Freq: Every day | ORAL | 0 refills | Status: AC

## 2017-07-28 NOTE — Progress Notes (Addendum)
Subjective:    Patient ID: Alyssa Cameron, female    DOB: Oct 22, 1918, 84 y.o.   MRN: [id number]  HPI 84 year old female who  has a past medical history of A-fib (HCC), Arthritis, CHF (congestive heart failure) (HCC), Diverticulitis, Frequent UTI, History of colon polyps, colonic polyps, Hypertension, Internal hemorrhoids, Pulmonary hypertension (HCC) (01/07/2015), Spinal stenosis, and Urine incontinence.  She presents to the office today for continued pruritis. I last saw her for this issue back on 06/21/2017 and she was seen prior to that by another provider on 05/24/2017. She has been prescribed Triamcinolone 0.1% cream compounded one-to-onewith Eucerin. She has been using claritin daily and moisturizer lotion. Unfortunately, her symptoms persist. She  Denies any rash and reports that the itching is present throughout the day   Review of Systems See HPI   Past Medical History:  Diagnosis Date  . A-fib (HCC)   . Arthritis   . CHF (congestive heart failure) (HCC)   . Diverticulitis   . Frequent UTI   . History of colon polyps   . Hx of colonic polyps    2013 - 2 tubular adenoma sigmoid and Transverse polyp  . Hypertension   . Internal hemorrhoids   . Pulmonary hypertension (HCC) 01/07/2015  . Spinal stenosis   . Urine incontinence     Social History   Socioeconomic History  . Marital status: Widowed    Spouse name: Not on file  . Number of children: Not on file  . Years of education: Not on file  . Highest education level: Not on file  Occupational History  . Not on file  Social Needs  . Financial resource strain: Not on file  . Food insecurity:    Worry: Not on file    Inability: Not on file  . Transportation needs:    Medical: Not on file    Non-medical: Not on file  Tobacco Use  . Smoking status: Never Smoker  . Smokeless tobacco: Never Used  Substance and Sexual Activity  . Alcohol use: No    Alcohol/week: 0.0 oz  . Drug use: No  . Sexual activity: Not on file    Lifestyle  . Physical activity:    Days per week: Not on file    Minutes per session: Not on file  . Stress: Not on file  Relationships  . Social connections:    Talks on phone: Not on file    Gets together: Not on file    Attends religious service: Not on file    Active member of club or organization: Not on file    Attends meetings of clubs or organizations: Not on file    Relationship status: Not on file  . Intimate partner violence:    Fear of current or ex partner: Not on file    Emotionally abused: Not on file    Physically abused: Not on file    Forced sexual activity: Not on file  Other Topics Concern  . Not on file  Social History Narrative   Retired from [employer] in [place] and moved here from [place]   2 daughter and 2 sons   Likes to read and do puzzles    Past Surgical History:  Procedure Laterality Date  . APPENDECTOMY  1941  . BREAST BIOPSY  2010  . CATARACT EXTRACTION    . GALLBLADDER SURGERY  1970  . TONSILLECTOMY AND ADENOIDECTOMY  1941  . VAGINAL HYSTERECTOMY  Family History  Problem Relation Age of Onset  . Heart failure Mother        33  . Heart failure Father        3  . Breast cancer Unknown   . Lung cancer Unknown   . Stroke Unknown     Allergies  Allergen Reactions  . Demerol [Meperidine] Nausea Only  . Vicodin [Hydrocodone-Acetaminophen] Nausea And Vomiting    Current Outpatient Medications on File Prior to Visit  Medication Sig Dispense Refill  . acetaminophen (TYLENOL) 500 MG tablet Take 500-1,000 mg by mouth every 8 (eight) hours as needed for moderate pain.     Marland Kitchen apixaban (ELIQUIS) 2.5 MG TABS tablet Take 1 tablet (2.5 mg total) by mouth 2 (two) times daily. 60 tablet   . CRANBERRY PO Take 1 tablet by mouth daily.    . furosemide (LASIX) 40 MG tablet Take 40 mg by mouth daily. Take with the 20 mg tablet 90 tablet 3  . lisinopril (PRINIVIL,ZESTRIL) 20 MG tablet Take 1 tablet (20 mg total) by mouth daily. 90 tablet 3  .  metolazone (ZAROXOLYN) 2.5 MG tablet Take 1 tablet (2.5 mg total) by mouth once a week. Take weekly on tuesday 4 tablet 3  . metoprolol succinate (TOPROL-XL) 100 MG 24 hr tablet TAKE 1 TABLET BY MOUTH  DAILY WITH BREAKFAST 90 tablet 1  . pantoprazole (PROTONIX) 40 MG tablet TAKE 1 TABLET BY MOUTH  DAILY 90 tablet 1  . potassium chloride SA (K-DUR,KLOR-CON) 20 MEQ tablet TAKE 1 TABLET BY MOUTH  EVERY DAY 90 tablet 1  . traZODone (DESYREL) 50 MG tablet TAKE 0.5-1 TABLETS (25-50 MG TOTAL) BY MOUTH AT BEDTIME AS NEEDED FOR SLEEP. 90 tablet 0  . furosemide (LASIX) 20 MG tablet Take 1 tablet (20 mg total) by mouth daily. Take with the 40 mg tablet 90 tablet 3   No current facility-administered medications on file prior to visit.     BP 112/70   Temp 98.3 F (36.8 C) (Oral)   Wt 171 lb (77.6 kg)   BMI 31.28 kg/m       Objective:   Physical Exam  Constitutional: She is oriented to person, place, and time. She appears well-developed and well-nourished. No distress.  Eyes: Pupils are equal, round, and reactive to light. Conjunctivae and EOM are normal.  Neck: Normal range of motion. Neck supple.  Cardiovascular: Normal rate, regular rhythm, normal heart sounds and intact distal pulses. Exam reveals no gallop and no friction rub.  No murmur heard. Pulmonary/Chest: Effort normal and breath sounds normal. No stridor. No respiratory distress. She has no wheezes. She has no rales. She exhibits no tenderness.  Abdominal: Soft. Bowel sounds are normal. She exhibits no distension and no mass. There is no tenderness. There is no rebound and no guarding. No hernia.  Neurological: She is alert and oriented to person, place, and time.  Skin: Skin is warm and dry. Capillary refill takes less than 2 seconds. She is not diaphoretic.  Psychiatric: She has a normal mood and affect. Her behavior is normal. Judgment and thought content normal.  Vitals reviewed.     Assessment & Plan:  1. Pruritus - Does not  appear to be dermatologic at this point. Need to r/o malignancy, liver dysfunction, endocrine issue, or renal failure. She does have CKD but kidney function has been stable in the mid 30's. Doubt medication related. Will trial low dose paxil as an off label use is for pruritus - TSH -  Basic Metabolic Panel - CBC with Differential/Platelet - Hepatic function panel - PARoxetine (PAXIL) 10 MG tablet; Take 1 tablet (10 mg total) by mouth daily.  Dispense: 30 tablet; Refill: 0 - DG Chest 2 View; Future  Shirline Frees, NP

## 2017-07-31 ENCOUNTER — Other Ambulatory Visit: Payer: Self-pay | Admitting: Adult Health

## 2017-07-31 ENCOUNTER — Telehealth: Payer: Self-pay | Admitting: Family Medicine

## 2017-07-31 NOTE — Telephone Encounter (Signed)
Copied from CRM [id number]. Topic: Appointment Scheduling - Scheduling Inquiry for Clinic >> Jul 31, 2017  1:45 PM Oneal Grout wrote: Reason for CRM: Requesting to see Alyssa Cameron tomorrow for black stools. Able to work in? Please advise. No 30 min appts open.

## 2017-08-01 ENCOUNTER — Other Ambulatory Visit: Payer: Self-pay | Admitting: Adult Health

## 2017-08-01 MED ORDER — ONDANSETRON HCL 4 MG PO TABS: 4.0000 mg | ORAL_TABLET | Freq: Three times a day (TID) | ORAL | 0 refills | Status: AC | PRN

## 2017-08-01 NOTE — Telephone Encounter (Signed)
Ok for cardiology to fill

## 2017-08-01 NOTE — Telephone Encounter (Signed)
Pt scheduled 08/03/17 to see [facility]  No further action required.

## 2017-08-01 NOTE — Telephone Encounter (Signed)
Should cardiology be filling this?

## 2017-08-03 ENCOUNTER — Encounter: Payer: Self-pay | Admitting: Adult Health

## 2017-08-03 ENCOUNTER — Ambulatory Visit: Payer: Medicare Other | Admitting: Adult Health

## 2017-08-03 VITALS — BP 134/80 | HR 87 | Temp 97.7°F | Wt 165.6 lb

## 2017-08-03 DIAGNOSIS — R110 Nausea: Secondary | ICD-10-CM | POA: Diagnosis not present

## 2017-08-03 NOTE — Progress Notes (Signed)
Subjective:    Patient ID: Alyssa Cameron, female    DOB: 03-29-1918, 84 y.o.   MRN: [id number]  HPI  84 year old female who presents to the office today for a new complaint of nausea. She was started on a low dose of paxil last week for chronic itching. Reports that the itching has resolved since starting paxil but since taking this medication she has developed nausea and subsequent loss of appetite. Denies any vomiting or diarrhea.   Wt Readings from Last 3 Encounters:  08/03/17 165 lb 9.6 oz (75.1 kg)  07/28/17 171 lb (77.6 kg)  06/21/17 171 lb (77.6 kg)    Review of Systems See HPI   Past Medical History:  Diagnosis Date  . A-fib (HCC)   . Arthritis   . CHF (congestive heart failure) (HCC)   . Diverticulitis   . Frequent UTI   . History of colon polyps   . Hx of colonic polyps    2013 - 2 tubular adenoma sigmoid and Transverse polyp  . Hypertension   . Internal hemorrhoids   . Pulmonary hypertension (HCC) 01/07/2015  . Spinal stenosis   . Urine incontinence     Social History   Socioeconomic History  . Marital status: Widowed    Spouse name: Not on file  . Number of children: Not on file  . Years of education: Not on file  . Highest education level: Not on file  Occupational History  . Not on file  Social Needs  . Financial resource strain: Not on file  . Food insecurity:    Worry: Not on file    Inability: Not on file  . Transportation needs:    Medical: Not on file    Non-medical: Not on file  Tobacco Use  . Smoking status: Never Smoker  . Smokeless tobacco: Never Used  Substance and Sexual Activity  . Alcohol use: No    Alcohol/week: 0.0 oz  . Drug use: No  . Sexual activity: Not on file  Lifestyle  . Physical activity:    Days per week: Not on file    Minutes per session: Not on file  . Stress: Not on file  Relationships  . Social connections:    Talks on phone: Not on file    Gets together: Not on file    Attends religious service: Not on  file    Active member of club or organization: Not on file    Attends meetings of clubs or organizations: Not on file    Relationship status: Not on file  . Intimate partner violence:    Fear of current or ex partner: Not on file    Emotionally abused: Not on file    Physically abused: Not on file    Forced sexual activity: Not on file  Other Topics Concern  . Not on file  Social History Narrative   Retired from [employer] in [place] and moved here from [place]   2 daughter and 2 sons   Likes to read and do puzzles    Past Surgical History:  Procedure Laterality Date  . APPENDECTOMY  1941  . BREAST BIOPSY  2010  . CATARACT EXTRACTION    . GALLBLADDER SURGERY  1970  . TONSILLECTOMY AND ADENOIDECTOMY  1941  . VAGINAL HYSTERECTOMY      Family History  Problem Relation Age of Onset  . Heart failure Mother        68  . Heart  failure Father        36  . Breast cancer Unknown   . Lung cancer Unknown   . Stroke Unknown     Allergies  Allergen Reactions  . Demerol [Meperidine] Nausea Only  . Vicodin [Hydrocodone-Acetaminophen] Nausea And Vomiting    Current Outpatient Medications on File Prior to Visit  Medication Sig Dispense Refill  . acetaminophen (TYLENOL) 500 MG tablet Take 500-1,000 mg by mouth every 8 (eight) hours as needed for moderate pain.     Marland Kitchen apixaban (ELIQUIS) 2.5 MG TABS tablet Take 1 tablet (2.5 mg total) by mouth 2 (two) times daily. 60 tablet   . CRANBERRY PO Take 1 tablet by mouth daily.    . furosemide (LASIX) 40 MG tablet Take 40 mg by mouth daily. Take with the 20 mg tablet 90 tablet 3  . lisinopril (PRINIVIL,ZESTRIL) 20 MG tablet Take 1 tablet (20 mg total) by mouth daily. 90 tablet 3  . metolazone (ZAROXOLYN) 2.5 MG tablet Take 1 tablet (2.5 mg total) by mouth once a week. Take weekly on tuesday 4 tablet 3  . metoprolol succinate (TOPROL-XL) 100 MG 24 hr tablet TAKE 1 TABLET BY MOUTH  DAILY WITH BREAKFAST 90 tablet 1  . ondansetron (ZOFRAN) 4 MG  tablet Take 1 tablet (4 mg total) by mouth every 8 (eight) hours as needed for nausea or vomiting. 20 tablet 0  . pantoprazole (PROTONIX) 40 MG tablet TAKE 1 TABLET BY MOUTH  DAILY 90 tablet 1  . PARoxetine (PAXIL) 10 MG tablet Take 1 tablet (10 mg total) by mouth daily. 30 tablet 0  . potassium chloride SA (K-DUR,KLOR-CON) 20 MEQ tablet TAKE 1 TABLET BY MOUTH  EVERY DAY 90 tablet 1  . traZODone (DESYREL) 50 MG tablet TAKE 0.5-1 TABLETS (25-50 MG TOTAL) BY MOUTH AT BEDTIME AS NEEDED FOR SLEEP. 90 tablet 0  . furosemide (LASIX) 20 MG tablet Take 1 tablet (20 mg total) by mouth daily. Take with the 40 mg tablet 90 tablet 3   No current facility-administered medications on file prior to visit.     BP 134/80 (BP Location: Left Arm, Patient Position: Sitting, Cuff Size: Normal)   Pulse 87   Temp 97.7 F (36.5 C) (Oral)   Wt 165 lb 9.6 oz (75.1 kg)   SpO2 98%   BMI 30.29 kg/m       Objective:   Physical Exam  Constitutional: She appears well-developed and well-nourished. No distress.  Cardiovascular: Normal rate, regular rhythm, normal heart sounds and intact distal pulses. Exam reveals no gallop and no friction rub.  No murmur heard. Pulmonary/Chest: Effort normal and breath sounds normal.  Skin: Skin is warm. She is not diaphoretic.  Psychiatric: She has a normal mood and affect. Her behavior is normal. Judgment and thought content normal.  Nursing note and vitals reviewed.     Assessment & Plan:  1. Nausea - ok to stop Paxil  - if itching comes back then ok to restart Paxil at dose of 5 mg  - Follow up as needed  Shirline Frees, NP

## 2017-08-09 ENCOUNTER — Telehealth: Payer: Self-pay | Admitting: Family Medicine

## 2017-08-09 NOTE — Telephone Encounter (Signed)
Copied from CRM [id number]. Topic: General - Call Back - No Documentation >> Aug 09, 2017  3:48 PM Windy Kalata, NT wrote: Reason for CRM: patient is calling and states that she has missed multiple calls from [facility] and wanted me to inform him that her nausea is better but she is still having itching. Please advise.

## 2017-08-10 NOTE — Telephone Encounter (Signed)
Spoke to Friona and she informed me that the nausea has subsided since quitting Paxil.  Unfortunately her chronic itching has returned.  We discussed options on the phone and she is okay with trying half a tab of Paxil daily to see if this helps resolve her itching, hopeful that her nausea will not not return with this low dose.  She will keep me posted and update me next week

## 2017-08-13 ENCOUNTER — Other Ambulatory Visit: Payer: Self-pay | Admitting: Cardiovascular Disease

## 2017-08-14 ENCOUNTER — Telehealth: Payer: Self-pay | Admitting: *Deleted

## 2017-08-14 NOTE — Telephone Encounter (Signed)
Alyssa Cameron with [facility] left a msg on the refill vm requesting a collaborating physician name, NPI and address before they can refill the metoprolol. Please call [phone] and provide reference number [id number]. They are requesting that future rx's include the collaborating physician to reduce the number of calls and faxes that are received from them. Thanks, MI

## 2017-08-15 NOTE — Telephone Encounter (Signed)
Spoke with Minerva AreolaEric at [facility] and he requested to have the physician who Dr Lyman BishopLawrence works under. Gave him Dr McDowell's name and NPI; he stated that was all he needed.

## 2017-08-19 ENCOUNTER — Other Ambulatory Visit: Payer: Self-pay | Admitting: Adult Health

## 2017-08-19 DIAGNOSIS — L299 Pruritus, unspecified: Secondary | ICD-10-CM

## 2017-08-22 NOTE — Telephone Encounter (Signed)
Is pt to continue medication?

## 2017-08-22 NOTE — Telephone Encounter (Signed)
Spoke to patient and she is only taking 1/2 tab ( 5 mg) does not need a refill at this time

## 2017-08-23 ENCOUNTER — Other Ambulatory Visit: Payer: Self-pay

## 2017-08-23 MED ORDER — FUROSEMIDE 20 MG PO TABS: 20.0000 mg | ORAL_TABLET | Freq: Every day | ORAL | 3 refills | Status: DC

## 2017-08-23 NOTE — Telephone Encounter (Signed)
Noted.  Message sent to the pharmacy.

## 2017-08-25 ENCOUNTER — Other Ambulatory Visit: Payer: Self-pay | Admitting: Adult Health

## 2017-08-25 DIAGNOSIS — L299 Pruritus, unspecified: Secondary | ICD-10-CM

## 2017-08-31 ENCOUNTER — Emergency Department (HOSPITAL_COMMUNITY): Payer: Medicare Other

## 2017-08-31 ENCOUNTER — Inpatient Hospital Stay (HOSPITAL_COMMUNITY)
Admission: EM | Admit: 2017-08-31 | Discharge: 2017-09-06 | DRG: 480 | Disposition: A | Discharge status: Skilled Nursing Facility | Payer: Medicare Other | Attending: Internal Medicine | Admitting: Internal Medicine

## 2017-08-31 DIAGNOSIS — N3020 Other chronic cystitis without hematuria: Secondary | ICD-10-CM | POA: Diagnosis present

## 2017-08-31 DIAGNOSIS — R739 Hyperglycemia, unspecified: Secondary | ICD-10-CM | POA: Diagnosis present

## 2017-08-31 DIAGNOSIS — Y92230 Patient room in hospital as the place of occurrence of the external cause: Secondary | ICD-10-CM | POA: Diagnosis not present

## 2017-08-31 DIAGNOSIS — N184 Chronic kidney disease, stage 4 (severe): Secondary | ICD-10-CM | POA: Diagnosis present

## 2017-08-31 DIAGNOSIS — Z79899 Other long term (current) drug therapy: Secondary | ICD-10-CM

## 2017-08-31 DIAGNOSIS — I130 Hypertensive heart and chronic kidney disease with heart failure and stage 1 through stage 4 chronic kidney disease, or unspecified chronic kidney disease: Secondary | ICD-10-CM | POA: Diagnosis present

## 2017-08-31 DIAGNOSIS — S7291XA Unspecified fracture of right femur, initial encounter for closed fracture: Secondary | ICD-10-CM

## 2017-08-31 DIAGNOSIS — D62 Acute posthemorrhagic anemia: Secondary | ICD-10-CM | POA: Diagnosis not present

## 2017-08-31 DIAGNOSIS — Z9849 Cataract extraction status, unspecified eye: Secondary | ICD-10-CM

## 2017-08-31 DIAGNOSIS — E8889 Other specified metabolic disorders: Secondary | ICD-10-CM | POA: Diagnosis present

## 2017-08-31 DIAGNOSIS — D638 Anemia in other chronic diseases classified elsewhere: Secondary | ICD-10-CM | POA: Diagnosis present

## 2017-08-31 DIAGNOSIS — Z8249 Family history of ischemic heart disease and other diseases of the circulatory system: Secondary | ICD-10-CM

## 2017-08-31 DIAGNOSIS — H9190 Unspecified hearing loss, unspecified ear: Secondary | ICD-10-CM | POA: Diagnosis present

## 2017-08-31 DIAGNOSIS — R0602 Shortness of breath: Secondary | ICD-10-CM

## 2017-08-31 DIAGNOSIS — Z6833 Body mass index (BMI) 33.0-33.9, adult: Secondary | ICD-10-CM

## 2017-08-31 DIAGNOSIS — I509 Heart failure, unspecified: Secondary | ICD-10-CM

## 2017-08-31 DIAGNOSIS — K219 Gastro-esophageal reflux disease without esophagitis: Secondary | ICD-10-CM | POA: Diagnosis present

## 2017-08-31 DIAGNOSIS — W19XXXA Unspecified fall, initial encounter: Secondary | ICD-10-CM | POA: Diagnosis present

## 2017-08-31 DIAGNOSIS — W010XXA Fall on same level from slipping, tripping and stumbling without subsequent striking against object, initial encounter: Secondary | ICD-10-CM | POA: Diagnosis present

## 2017-08-31 DIAGNOSIS — T502X5A Adverse effect of carbonic-anhydrase inhibitors, benzothiadiazides and other diuretics, initial encounter: Secondary | ICD-10-CM | POA: Diagnosis not present

## 2017-08-31 DIAGNOSIS — I272 Pulmonary hypertension, unspecified: Secondary | ICD-10-CM | POA: Diagnosis present

## 2017-08-31 DIAGNOSIS — D631 Anemia in chronic kidney disease: Secondary | ICD-10-CM | POA: Diagnosis present

## 2017-08-31 DIAGNOSIS — D619 Aplastic anemia, unspecified: Secondary | ICD-10-CM | POA: Diagnosis present

## 2017-08-31 DIAGNOSIS — Z90710 Acquired absence of both cervix and uterus: Secondary | ICD-10-CM

## 2017-08-31 DIAGNOSIS — T148XXA Other injury of unspecified body region, initial encounter: Secondary | ICD-10-CM

## 2017-08-31 DIAGNOSIS — I482 Chronic atrial fibrillation, unspecified: Secondary | ICD-10-CM | POA: Diagnosis present

## 2017-08-31 DIAGNOSIS — S72461A Displaced supracondylar fracture with intracondylar extension of lower end of right femur, initial encounter for closed fracture: Principal | ICD-10-CM | POA: Diagnosis present

## 2017-08-31 DIAGNOSIS — S72491A Other fracture of lower end of right femur, initial encounter for closed fracture: Secondary | ICD-10-CM

## 2017-08-31 DIAGNOSIS — Z87440 Personal history of urinary (tract) infections: Secondary | ICD-10-CM

## 2017-08-31 DIAGNOSIS — Z86010 Personal history of colonic polyps: Secondary | ICD-10-CM

## 2017-08-31 DIAGNOSIS — M810 Age-related osteoporosis without current pathological fracture: Secondary | ICD-10-CM | POA: Diagnosis present

## 2017-08-31 DIAGNOSIS — Z885 Allergy status to narcotic agent status: Secondary | ICD-10-CM

## 2017-08-31 DIAGNOSIS — E669 Obesity, unspecified: Secondary | ICD-10-CM | POA: Diagnosis present

## 2017-08-31 DIAGNOSIS — N179 Acute kidney failure, unspecified: Secondary | ICD-10-CM | POA: Diagnosis not present

## 2017-08-31 DIAGNOSIS — I10 Essential (primary) hypertension: Secondary | ICD-10-CM | POA: Diagnosis present

## 2017-08-31 DIAGNOSIS — Z7901 Long term (current) use of anticoagulants: Secondary | ICD-10-CM

## 2017-08-31 DIAGNOSIS — I5043 Acute on chronic combined systolic (congestive) and diastolic (congestive) heart failure: Secondary | ICD-10-CM | POA: Diagnosis present

## 2017-08-31 DIAGNOSIS — M1990 Unspecified osteoarthritis, unspecified site: Secondary | ICD-10-CM | POA: Diagnosis present

## 2017-08-31 DIAGNOSIS — Z419 Encounter for procedure for purposes other than remedying health state, unspecified: Secondary | ICD-10-CM

## 2017-08-31 DIAGNOSIS — R0902 Hypoxemia: Secondary | ICD-10-CM

## 2017-08-31 MED ORDER — ONDANSETRON HCL 4 MG/2ML IJ SOLN
4.0000 mg | Freq: Once | INTRAMUSCULAR | Status: AC
  Administered 2017-08-31: 4 mg via INTRAVENOUS
  Filled 2017-08-31: qty 2

## 2017-08-31 MED ORDER — MORPHINE SULFATE (PF) 4 MG/ML IV SOLN
4.0000 mg | Freq: Once | INTRAVENOUS | Status: AC
  Administered 2017-08-31: 4 mg via INTRAVENOUS
  Filled 2017-08-31: qty 1

## 2017-08-31 NOTE — ED Triage Notes (Signed)
[facility]. Patient stumbled and fell to a sitting position from a standing position with her right leg underneath her. Patient complaining of severe pain to right knee-EMS found patient on floor with leg underneath her. EMS administered Fentanyl 100 mcg and patient nauseated and heaving-administered Zofran 4 mg IV. EMS states pain and swelling to right knee with crepitus.

## 2017-08-31 NOTE — ED Provider Notes (Signed)
Medical screening examination/treatment/procedure(s) were conducted as a shared visit with non-physician practitioner(s) and myself.  I personally evaluated the patient during the encounter.  Clinical Impression:   Final diagnoses:  Closed comminuted intra-articular fracture of distal femur, right, initial encounter (HCC)  Chronic a-fib (HCC)    The pt had a fall tonight -landed on her right lower extremity, has a deformity around the knee, transported by paramedics.  On exam has significant swelling around the knee.  She has normal pulses at the right foot.  She is unable to move her right knee.  I have personally seen the x-rays, and shows a distal femur fracture on the right.  Orthopedics will be consulted patient will be admitted to the hospital.  Pain control offered.   EKG Interpretation  Date/Time:  Thursday August 31 2017 22:46:50 EDT Ventricular Rate:  68 PR Interval:    QRS Duration: 95 QT Interval:  386 QTC Calculation: 411 R Axis:   125 Text Interpretation:  Atrial fibrillation Right axis deviation Low voltage, precordial leads Nonspecific T abnormalities, lateral leads No old tracing to compare Confirmed by Eber HongMiller, Dereona Kolodny ([id number]) on 08/31/2017 10:53:54 PM         Eber HongMiller, Braylen Staller, MD 09/03/17 [phone]

## 2017-08-31 NOTE — ED Notes (Signed)
hpoa Niuecindy sullivan [phone]

## 2017-08-31 NOTE — ED Notes (Signed)
Bed: WA03 Expected date:  Expected time:  Means of arrival:  Comments: 84 yr old fall

## 2017-08-31 NOTE — ED Provider Notes (Signed)
[facility]-EMERGENCY DEPT Provider Note   CSN: [id number] Arrival date & time: 08/31/17  2213     History   Chief Complaint Chief Complaint  Patient presents with  . Fall  . Right knee pain    HPI Syenna Nazir is a 84 y.o. female.  The history is provided by the patient and medical records.  Fall    84 y.o. F with hx of AFIB, arthritis, CHF, frequent UTI's, pulmonary HTN, spinal stenosis, presenting to the ED for right knee pain.  Patient states she got tripped up and fell from standing position directly onto her right knee.  When EMS got to her her right knee was bent beneath her body.  She did get some relief with straightening it out.  She denies any numbness or weakness of her right leg.  Reports a long-standing history of chronic knee issues.  She has not had any surgeries on her right knee.  She is on eliquis for AFIB.  She denies any head trauma, LOC.  Has been AAOx3 with EMS.  Denies any other injuries.  She does use a walker when up and moving about.  Past Medical History:  Diagnosis Date  . A-fib (HCC)   . Arthritis   . CHF (congestive heart failure) (HCC)   . Diverticulitis   . Frequent UTI   . History of colon polyps   . Hx of colonic polyps    2013 - 2 tubular adenoma sigmoid and Transverse polyp  . Hypertension   . Internal hemorrhoids   . Pulmonary hypertension (HCC) 01/07/2015  . Spinal stenosis   . Urine incontinence     Patient Active Problem List   Diagnosis Date Noted  . Urine incontinence   . Spinal stenosis   . Internal hemorrhoids   . Hypertension   . Hx of colonic polyps   . History of colon polyps   . Frequent UTI   . Diverticulitis   . CHF (congestive heart failure) (HCC)   . A-fib (HCC)   . Renal insufficiency 01/13/2017  . Pulmonary hypertension (HCC) 01/07/2015  . Chronic atrial fibrillation (HCC) 10/20/2014  . Acute on chronic right heart failure (HCC) 10/20/2014  . Essential hypertension 10/20/2014  .  Arthritis 10/20/2014  . Chronic UTI 10/20/2014    Past Surgical History:  Procedure Laterality Date  . APPENDECTOMY  1941  . BREAST BIOPSY  2010  . CATARACT EXTRACTION    . GALLBLADDER SURGERY  1970  . TONSILLECTOMY AND ADENOIDECTOMY  1941  . VAGINAL HYSTERECTOMY       OB History   None      Home Medications    Prior to Admission medications   Medication Sig Start Date End Date Taking? Authorizing Provider  acetaminophen (TYLENOL) 500 MG tablet Take 500-1,000 mg by mouth every 8 (eight) hours as needed for moderate pain.     [provider]  apixaban (ELIQUIS) 2.5 MG TABS tablet Take 1 tablet (2.5 mg total) by mouth 2 (two) times daily. 03/08/17   Chilton Si, MD  CRANBERRY PO Take 1 tablet by mouth daily.    [provider]  furosemide (LASIX) 20 MG tablet Take 1 tablet (20 mg total) by mouth daily. Take with the 40 mg tablet 08/23/17   Chilton Si, MD  furosemide (LASIX) 40 MG tablet Take 40 mg by mouth daily. Take with the 20 mg tablet 03/13/17   Hilty, Lisette Abu, MD  furosemide (LASIX) 40 MG tablet TAKE 1 TABLET  BY MOUTH  DAILY 08/22/17   Chilton Siandolph, Tiffany, MD  lisinopril (PRINIVIL,ZESTRIL) 20 MG tablet Take 1 tablet (20 mg total) by mouth daily. 03/01/17   Chilton Siandolph, Tiffany, MD  metolazone (ZAROXOLYN) 2.5 MG tablet Take 1 tablet (2.5 mg total) by mouth once a week. Take weekly on tuesday 06/22/17 09/20/17  Jodelle GrossLawrence, Kathryn M, NP  metoprolol succinate (TOPROL-XL) 100 MG 24 hr tablet TAKE 1 TABLET BY MOUTH  DAILY WITH BREAKFAST 06/12/17   Azalee CourseMeng, Hao, PA  ondansetron (ZOFRAN) 4 MG tablet Take 1 tablet (4 mg total) by mouth every 8 (eight) hours as needed for nausea or vomiting. 08/01/17   Nafziger, Kandee Keenory, NP  pantoprazole (PROTONIX) 40 MG tablet TAKE 1 TABLET BY MOUTH  DAILY 06/13/17   Nafziger, Kandee Keenory, NP  PARoxetine (PAXIL) 10 MG tablet Take 1 tablet (10 mg total) by mouth daily. 07/28/17   Nafziger, Kandee Keenory, NP  potassium chloride SA (K-DUR,KLOR-CON) 20 MEQ  tablet TAKE 1 TABLET BY MOUTH  EVERY DAY 01/11/17   Nafziger, Kandee Keenory, NP  traZODone (DESYREL) 50 MG tablet TAKE 0.5-1 TABLETS (25-50 MG TOTAL) BY MOUTH AT BEDTIME AS NEEDED FOR SLEEP. 06/07/17   Shirline FreesNafziger, Cory, NP    Family History Family History  Problem Relation Age of Onset  . Heart failure Mother        8064  . Heart failure Father        6875  . Breast cancer Unknown   . Lung cancer Unknown   . Stroke Unknown     Social History Social History   Tobacco Use  . Smoking status: Never Smoker  . Smokeless tobacco: Never Used  Substance Use Topics  . Alcohol use: No    Alcohol/week: 0.0 oz  . Drug use: No     Allergies   Demerol [meperidine] and Vicodin [hydrocodone-acetaminophen]   Review of Systems Review of Systems  Musculoskeletal: Positive for arthralgias.  All other systems reviewed and are negative.    Physical Exam Updated Vital Signs BP (!) 144/80 (BP Location: Right Arm)   Pulse (!) 113   Temp 97.6 F (36.4 C) (Oral)   Resp 20   SpO2 97%   Physical Exam  Constitutional: She is oriented to person, place, and time. She appears well-developed and well-nourished.  HENT:  Head: Normocephalic and atraumatic.  Mouth/Throat: Oropharynx is clear and moist.  Eyes: Pupils are equal, round, and reactive to light. Conjunctivae and EOM are normal.  Neck: Normal range of motion.  Cardiovascular: Normal rate, regular rhythm and normal heart sounds.  Pulmonary/Chest: Effort normal and breath sounds normal. No stridor. No respiratory distress.  Abdominal: Soft. Bowel sounds are normal. There is no tenderness. There is no rebound.  Musculoskeletal: Normal range of motion.  Right knee diffusely swollen, tenderness throughout, unable to range knee; some crepitus noted with palpation; DP pulse intact, moving toes normally  Neurological: She is alert and oriented to person, place, and time.  Skin: Skin is warm and dry.  Several areas of bruising to both legs; most of which  appear old  Psychiatric: She has a normal mood and affect.  Nursing note and vitals reviewed.    ED Treatments / Results  Labs (all labs ordered are listed, but only abnormal results are displayed) Labs Reviewed  CBC WITH DIFFERENTIAL/PLATELET - Abnormal; Notable for the following components:      Result Value   RBC 3.27 (*)    Hemoglobin 9.3 (*)    HCT 30.8 (*)    RDW 18.1 (*)  All other components within normal limits  BASIC METABOLIC PANEL - Abnormal; Notable for the following components:   Glucose, Bld 202 (*)    BUN 44 (*)    Creatinine, Ser 1.53 (*)    GFR calc non Af Amer 28 (*)    GFR calc Af Amer 32 (*)    All other components within normal limits  PROTIME-INR - Abnormal; Notable for the following components:   Prothrombin Time 17.6 (*)    All other components within normal limits  TYPE AND SCREEN  ABO/RH    EKG EKG Interpretation  Date/Time:  Thursday August 31 2017 22:46:50 EDT Ventricular Rate:  68 PR Interval:    QRS Duration: 95 QT Interval:  386 QTC Calculation: 411 R Axis:   125 Text Interpretation:  Atrial fibrillation Right axis deviation Low voltage, precordial leads Nonspecific T abnormalities, lateral leads No old tracing to compare Confirmed by Eber Hong ([id number]) on 08/31/2017 10:53:54 PM   Radiology Dg Chest 1 View  Result Date: 08/31/2017 CLINICAL DATA:  Initial preoperative evaluation. EXAM: CHEST  1 VIEW COMPARISON:  Prior radiograph from 07/28/2017. FINDINGS: Cardiomegaly, stable. Mediastinal silhouette within normal limits. Aortic atherosclerosis. Lungs hypoinflated. Diffuse pulmonary vascular congestion with interstitial prominence, suggesting mild pulmonary interstitial congestion/edema. Superimposed mild left basilar scarring, similar to previous. No focal infiltrates. No pleural effusion. No pneumothorax. No acute osseous abnormality. IMPRESSION: 1. Cardiomegaly with mild diffuse pulmonary interstitial congestion/edema. 2. Aortic  atherosclerosis. Electronically Signed   By: Rise Mu M.D.   On: 08/31/2017 22:53   Ct Knee Right Wo Contrast  Result Date: 09/01/2017 CLINICAL DATA:  Patient stumbled and fell. Right knee fracture seen on radiographs. EXAM: CT OF THE right KNEE WITHOUT CONTRAST TECHNIQUE: Multidetector CT imaging of the right knee was performed according to the standard protocol. Multiplanar CT image reconstructions were also generated. COMPARISON:  Right knee radiograph 08/31/2017 FINDINGS: Bones/Joint/Cartilage Markedly comminuted fractures of the distal right radial metaphysis with extension to the trochlea. Fracture lines extend also to the patella femoral articular surface. There is impaction of the fracture fragments with posterior angulation of the major distal fragments and displacement of multiple cortical fragments. Mild lateral subluxation of the tibia with respect to the femoral heads. Proximal tibia and fibula appear intact. Underlying chronic degenerative changes with medial and lateral compartment narrowing and 3 compartment osteophyte formation. Chondrocalcinosis. Moderate-sized joint effusion. Ligaments Suboptimally assessed by CT. Muscles and Tendons Fatty atrophy of the musculature. No intramuscular hematoma identified. Soft tissues Diffuse soft tissue edema throughout the subcutaneous fatty tissues. Vascular calcifications in the popliteal artery and tibial trunk. IMPRESSION: Markedly comminuted fractures of the distal right radial metaphysis extending to the trochlea and patella femoral articular surface. Impaction of fracture fragments with posterior angulation of the major distal fragments and displacement of multiple cortical fragments. Mild lateral subluxation of the tibia with respect to the femoral head fragments. Moderate-sized effusion and diffuse edema. Chronic tricompartment degenerative changes in the knee. Electronically Signed   By: Burman Nieves M.D.   On: 09/01/2017 01:00    Dg Knee Complete 4 Views Right  Result Date: 08/31/2017 CLINICAL DATA:  [facility] at nursing home.  Obvious deformity. EXAM: RIGHT KNEE - COMPLETE 4+ VIEW COMPARISON:  None. FINDINGS: Acute comminuted impacted distal femur fracture with intra-articular extension through the trochlea. Posterior angulation distal bony fragments. No dislocation. No destructive bony lesions. Osteopenia. Soft tissue swelling and joint effusion. Mild vascular calcifications. IMPRESSION: Acute displaced distal femur fracture.  No dislocation. Electronically Signed  By: Awilda Metro M.D.   On: 08/31/2017 22:42    Procedures Procedures (including critical care time)  Medications Ordered in ED Medications - No data to display   Initial Impression / Assessment and Plan / ED Course  I have reviewed the triage vital signs and the nursing notes.  Pertinent labs & imaging results that were available during my care of the patient were reviewed by me and considered in my medical decision making (see chart for details).  84 y.o. female here after mechanical fall.  Got tripped over her own feet and fell directly onto the right knee.  No head injury or loss of consciousness.  EMS found her with her right leg bent beneath her body.  Does have diffuse swelling and tenderness throughout the right knee on exam.  There is some crepitus even with palpation.  Leg is neurovascularly intact.  Patient denies any other injuries.  She does take Eliquis for A. Fib.  Initial knee films with comminuted distal femur fracture with intra-articular extension.  Labs, chest x-ray, EKG sent in anticipation for operative repair.  Results discussed with patient, she acknowledged understanding.  Spoke with Dr. Dion Saucier on-call for orthopedics, recommended CT scan of the knee, n.p.o. after midnight, and transfer to [facility] for operative repair in the morning.  We will admit to medicine service.  Discussed with Dr. Katrinka Blazing-- aware of orthopedic  recommendations, will admit and arrange transfer to .  Final Clinical Impressions(s) / ED Diagnoses   Final diagnoses:  Closed comminuted intra-articular fracture of distal femur, right, initial encounter [facility])  Chronic a-fib [facility])    ED Discharge Orders    None       Garlon Hatchet, PA-C 09/01/17 0159    Eber Hong, MD 09/03/17 [phone]

## 2017-08-31 NOTE — Progress Notes (Signed)
84 year old with fall today and complex right intrarticular distal femur fracture.   Past Medical History:  Diagnosis Date  . A-fib (HCC)   . Arthritis   . CHF (congestive heart failure) (HCC)   . Diverticulitis   . Frequent UTI   . History of colon polyps   . Hx of colonic polyps    2013 - 2 tubular adenoma sigmoid and Transverse polyp  . Hypertension   . Internal hemorrhoids   . Pulmonary hypertension (HCC) 01/07/2015  . Spinal stenosis   . Urine incontinence     Currently on eliquis for a-fib.    Patient will need medical optimization, and then likely ORIF right distal femur, possible Friday.   Recommend [facility] to [facility], npo after midnight, full consult to follow.   Eulas PostJoshua P Ariyanah Aguado, MD

## 2017-09-01 ENCOUNTER — Inpatient Hospital Stay (HOSPITAL_COMMUNITY): Payer: Medicare Other | Admitting: Anesthesiology

## 2017-09-01 ENCOUNTER — Encounter (HOSPITAL_COMMUNITY): Payer: Self-pay | Admitting: General Practice

## 2017-09-01 ENCOUNTER — Inpatient Hospital Stay (HOSPITAL_COMMUNITY): Payer: Medicare Other

## 2017-09-01 ENCOUNTER — Encounter (HOSPITAL_COMMUNITY)
Admission: EM | Disposition: A | Discharge status: Skilled Nursing Facility | Payer: Self-pay | Source: Home / Self Care | Attending: Internal Medicine

## 2017-09-01 DIAGNOSIS — D619 Aplastic anemia, unspecified: Secondary | ICD-10-CM | POA: Diagnosis present

## 2017-09-01 DIAGNOSIS — Z9849 Cataract extraction status, unspecified eye: Secondary | ICD-10-CM | POA: Diagnosis not present

## 2017-09-01 DIAGNOSIS — I5030 Unspecified diastolic (congestive) heart failure: Secondary | ICD-10-CM

## 2017-09-01 DIAGNOSIS — D631 Anemia in chronic kidney disease: Secondary | ICD-10-CM | POA: Diagnosis present

## 2017-09-01 DIAGNOSIS — I482 Chronic atrial fibrillation: Secondary | ICD-10-CM | POA: Diagnosis present

## 2017-09-01 DIAGNOSIS — N184 Chronic kidney disease, stage 4 (severe): Secondary | ICD-10-CM

## 2017-09-01 DIAGNOSIS — D638 Anemia in other chronic diseases classified elsewhere: Secondary | ICD-10-CM | POA: Diagnosis not present

## 2017-09-01 DIAGNOSIS — Z6833 Body mass index (BMI) 33.0-33.9, adult: Secondary | ICD-10-CM | POA: Diagnosis not present

## 2017-09-01 DIAGNOSIS — Y92230 Patient room in hospital as the place of occurrence of the external cause: Secondary | ICD-10-CM | POA: Diagnosis not present

## 2017-09-01 DIAGNOSIS — N179 Acute kidney failure, unspecified: Secondary | ICD-10-CM | POA: Diagnosis not present

## 2017-09-01 DIAGNOSIS — Z86010 Personal history of colonic polyps: Secondary | ICD-10-CM | POA: Diagnosis not present

## 2017-09-01 DIAGNOSIS — S7291XA Unspecified fracture of right femur, initial encounter for closed fracture: Secondary | ICD-10-CM | POA: Diagnosis present

## 2017-09-01 DIAGNOSIS — Z7901 Long term (current) use of anticoagulants: Secondary | ICD-10-CM | POA: Diagnosis not present

## 2017-09-01 DIAGNOSIS — S72461A Displaced supracondylar fracture with intracondylar extension of lower end of right femur, initial encounter for closed fracture: Secondary | ICD-10-CM | POA: Diagnosis present

## 2017-09-01 DIAGNOSIS — T148XXA Other injury of unspecified body region, initial encounter: Secondary | ICD-10-CM | POA: Diagnosis not present

## 2017-09-01 DIAGNOSIS — E8889 Other specified metabolic disorders: Secondary | ICD-10-CM | POA: Diagnosis present

## 2017-09-01 DIAGNOSIS — W010XXA Fall on same level from slipping, tripping and stumbling without subsequent striking against object, initial encounter: Secondary | ICD-10-CM | POA: Diagnosis not present

## 2017-09-01 DIAGNOSIS — Z87440 Personal history of urinary (tract) infections: Secondary | ICD-10-CM | POA: Diagnosis not present

## 2017-09-01 DIAGNOSIS — W19XXXA Unspecified fall, initial encounter: Secondary | ICD-10-CM | POA: Diagnosis not present

## 2017-09-01 DIAGNOSIS — Z8249 Family history of ischemic heart disease and other diseases of the circulatory system: Secondary | ICD-10-CM | POA: Diagnosis not present

## 2017-09-01 DIAGNOSIS — I5043 Acute on chronic combined systolic (congestive) and diastolic (congestive) heart failure: Secondary | ICD-10-CM | POA: Diagnosis present

## 2017-09-01 DIAGNOSIS — M810 Age-related osteoporosis without current pathological fracture: Secondary | ICD-10-CM | POA: Diagnosis present

## 2017-09-01 DIAGNOSIS — I130 Hypertensive heart and chronic kidney disease with heart failure and stage 1 through stage 4 chronic kidney disease, or unspecified chronic kidney disease: Secondary | ICD-10-CM | POA: Diagnosis present

## 2017-09-01 DIAGNOSIS — N3020 Other chronic cystitis without hematuria: Secondary | ICD-10-CM | POA: Diagnosis not present

## 2017-09-01 DIAGNOSIS — H9190 Unspecified hearing loss, unspecified ear: Secondary | ICD-10-CM | POA: Diagnosis not present

## 2017-09-01 DIAGNOSIS — I10 Essential (primary) hypertension: Secondary | ICD-10-CM | POA: Diagnosis not present

## 2017-09-01 DIAGNOSIS — T502X5A Adverse effect of carbonic-anhydrase inhibitors, benzothiadiazides and other diuretics, initial encounter: Secondary | ICD-10-CM | POA: Diagnosis not present

## 2017-09-01 DIAGNOSIS — M1990 Unspecified osteoarthritis, unspecified site: Secondary | ICD-10-CM | POA: Diagnosis not present

## 2017-09-01 DIAGNOSIS — D62 Acute posthemorrhagic anemia: Secondary | ICD-10-CM | POA: Diagnosis not present

## 2017-09-01 DIAGNOSIS — I272 Pulmonary hypertension, unspecified: Secondary | ICD-10-CM | POA: Diagnosis not present

## 2017-09-01 DIAGNOSIS — E669 Obesity, unspecified: Secondary | ICD-10-CM | POA: Diagnosis not present

## 2017-09-01 DIAGNOSIS — S72491A Other fracture of lower end of right femur, initial encounter for closed fracture: Secondary | ICD-10-CM | POA: Diagnosis not present

## 2017-09-01 DIAGNOSIS — Z90710 Acquired absence of both cervix and uterus: Secondary | ICD-10-CM | POA: Diagnosis not present

## 2017-09-01 HISTORY — PX: ORIF FEMUR FRACTURE: SHX2119

## 2017-09-01 LAB — URINALYSIS, ROUTINE W REFLEX MICROSCOPIC
BILIRUBIN URINE: NEGATIVE
Glucose, UA: NEGATIVE mg/dL
Ketones, ur: NEGATIVE mg/dL
Nitrite: NEGATIVE
PH: 5 (ref 5.0–8.0)
Protein, ur: NEGATIVE mg/dL
SPECIFIC GRAVITY, URINE: 1.009 (ref 1.005–1.030)

## 2017-09-01 LAB — CBC
HEMATOCRIT: 27 % — AB (ref 36.0–46.0)
Hemoglobin: 7.9 g/dL — ABNORMAL LOW (ref 12.0–15.0)
MCH: 28.1 pg (ref 26.0–34.0)
MCHC: 29.3 g/dL — ABNORMAL LOW (ref 30.0–36.0)
MCV: 96.1 fL (ref 78.0–100.0)
Platelets: 201 10*3/uL (ref 150–400)
RBC: 2.81 MIL/uL — ABNORMAL LOW (ref 3.87–5.11)
RDW: 17.9 % — AB (ref 11.5–15.5)
WBC: 10.2 10*3/uL (ref 4.0–10.5)

## 2017-09-01 LAB — TYPE AND SCREEN
ABO/RH(D): O POS
ANTIBODY SCREEN: NEGATIVE

## 2017-09-01 LAB — BASIC METABOLIC PANEL
Anion gap: 10 (ref 5–15)
BUN: 44 mg/dL — AB (ref 6–20)
CHLORIDE: 104 mmol/L (ref 101–111)
CO2: 24 mmol/L (ref 22–32)
CREATININE: 1.53 mg/dL — AB (ref 0.44–1.00)
Calcium: 9.4 mg/dL (ref 8.9–10.3)
GFR calc Af Amer: 32 mL/min — ABNORMAL LOW (ref >60)
GFR calc non Af Amer: 28 mL/min — ABNORMAL LOW (ref >60)
Glucose, Bld: 202 mg/dL — ABNORMAL HIGH (ref 65–99)
Potassium: 5 mmol/L (ref 3.5–5.1)
Sodium: 138 mmol/L (ref 135–145)

## 2017-09-01 LAB — CREATININE, SERUM
Creatinine, Ser: 1.58 mg/dL — ABNORMAL HIGH (ref 0.44–1.00)
GFR calc Af Amer: 31 mL/min — ABNORMAL LOW (ref >60)
GFR calc non Af Amer: 26 mL/min — ABNORMAL LOW (ref >60)

## 2017-09-01 LAB — BRAIN NATRIURETIC PEPTIDE: B Natriuretic Peptide: 472.5 pg/mL — ABNORMAL HIGH (ref 0.0–100.0)

## 2017-09-01 LAB — ABO/RH
ABO/RH(D): O POS
ABO/RH(D): O POS

## 2017-09-01 LAB — CBC WITH DIFFERENTIAL/PLATELET
Basophils Absolute: 0.1 10*3/uL (ref 0.0–0.1)
Basophils Relative: 1 %
Eosinophils Absolute: 0.1 10*3/uL (ref 0.0–0.7)
Eosinophils Relative: 1 %
HEMATOCRIT: 30.8 % — AB (ref 36.0–46.0)
Hemoglobin: 9.3 g/dL — ABNORMAL LOW (ref 12.0–15.0)
LYMPHS ABS: 0.8 10*3/uL (ref 0.7–4.0)
LYMPHS PCT: 13 %
MCH: 28.4 pg (ref 26.0–34.0)
MCHC: 30.2 g/dL (ref 30.0–36.0)
MCV: 94.2 fL (ref 78.0–100.0)
MONOS PCT: 11 %
Monocytes Absolute: 0.7 10*3/uL (ref 0.1–1.0)
NEUTROS ABS: 5 10*3/uL (ref 1.7–7.7)
Neutrophils Relative %: 74 %
Platelets: 234 10*3/uL (ref 150–400)
RBC: 3.27 MIL/uL — ABNORMAL LOW (ref 3.87–5.11)
RDW: 18.1 % — ABNORMAL HIGH (ref 11.5–15.5)
WBC: 6.7 10*3/uL (ref 4.0–10.5)

## 2017-09-01 LAB — SURGICAL PCR SCREEN
MRSA, PCR: NEGATIVE
STAPHYLOCOCCUS AUREUS: POSITIVE — AB

## 2017-09-01 LAB — PROTIME-INR
INR: 1.46
Prothrombin Time: 17.6 seconds — ABNORMAL HIGH (ref 11.4–15.2)

## 2017-09-01 LAB — PREPARE RBC (CROSSMATCH)

## 2017-09-01 SURGERY — OPEN REDUCTION INTERNAL FIXATION (ORIF) DISTAL FEMUR FRACTURE
Anesthesia: Regional | Laterality: Right

## 2017-09-01 MED ORDER — PROCHLORPERAZINE EDISYLATE 10 MG/2ML IJ SOLN
10.0000 mg | Freq: Four times a day (QID) | INTRAMUSCULAR | Status: DC | PRN
  Administered 2017-09-01: 10 mg via INTRAVENOUS
  Filled 2017-09-01 (×2): qty 2

## 2017-09-01 MED ORDER — ONDANSETRON HCL 4 MG PO TABS
4.0000 mg | ORAL_TABLET | Freq: Four times a day (QID) | ORAL | Status: DC | PRN
Start: 2017-09-01 — End: 2017-09-06

## 2017-09-01 MED ORDER — ROCURONIUM BROMIDE 10 MG/ML (PF) SYRINGE
PREFILLED_SYRINGE | INTRAVENOUS | Status: DC | PRN
  Administered 2017-09-01: 30 mg via INTRAVENOUS
  Administered 2017-09-01 (×2): 10 mg via INTRAVENOUS

## 2017-09-01 MED ORDER — ONDANSETRON HCL 4 MG/2ML IJ SOLN
INTRAMUSCULAR | Status: DC | PRN
  Administered 2017-09-01: 4 mg via INTRAVENOUS

## 2017-09-01 MED ORDER — METHOCARBAMOL 500 MG PO TABS
500.0000 mg | ORAL_TABLET | Freq: Four times a day (QID) | ORAL | Status: DC | PRN
  Administered 2017-09-01 – 2017-09-06 (×10): 500 mg via ORAL
  Filled 2017-09-01 (×10): qty 1

## 2017-09-01 MED ORDER — CEFAZOLIN SODIUM-DEXTROSE 1-4 GM/50ML-% IV SOLN
1.0000 g | Freq: Two times a day (BID) | INTRAVENOUS | Status: AC
  Administered 2017-09-02: 1 g via INTRAVENOUS
  Filled 2017-09-01: qty 50

## 2017-09-01 MED ORDER — SUGAMMADEX SODIUM 200 MG/2ML IV SOLN
INTRAVENOUS | Status: AC
  Filled 2017-09-01: qty 2

## 2017-09-01 MED ORDER — PHENYLEPHRINE 40 MCG/ML (10ML) SYRINGE FOR IV PUSH (FOR BLOOD PRESSURE SUPPORT)
PREFILLED_SYRINGE | INTRAVENOUS | Status: AC
  Filled 2017-09-01: qty 10

## 2017-09-01 MED ORDER — LIDOCAINE 2% (20 MG/ML) 5 ML SYRINGE
INTRAMUSCULAR | Status: AC
  Filled 2017-09-01: qty 5

## 2017-09-01 MED ORDER — HYDROCODONE-ACETAMINOPHEN 5-325 MG PO TABS
1.0000 | ORAL_TABLET | Freq: Four times a day (QID) | ORAL | Status: DC | PRN
  Administered 2017-09-02 (×2): 1 via ORAL
  Administered 2017-09-03: 2 via ORAL
  Administered 2017-09-03 (×2): 1 via ORAL
  Administered 2017-09-04: 2 via ORAL
  Filled 2017-09-01: qty 1
  Filled 2017-09-01: qty 2
  Filled 2017-09-01 (×3): qty 1
  Filled 2017-09-01: qty 2
  Filled 2017-09-01: qty 1

## 2017-09-01 MED ORDER — FENTANYL CITRATE (PF) 100 MCG/2ML IJ SOLN
INTRAMUSCULAR | Status: AC
  Administered 2017-09-01: 25 ug via INTRAVENOUS
  Filled 2017-09-01: qty 2

## 2017-09-01 MED ORDER — ONDANSETRON HCL 4 MG/2ML IJ SOLN
4.0000 mg | Freq: Once | INTRAMUSCULAR | Status: AC
  Administered 2017-09-01: 4 mg via INTRAVENOUS
  Filled 2017-09-01: qty 2

## 2017-09-01 MED ORDER — MORPHINE SULFATE (PF) 2 MG/ML IV SOLN
0.5000 mg | INTRAVENOUS | Status: DC | PRN
  Administered 2017-09-01 – 2017-09-06 (×14): 0.5 mg via INTRAVENOUS
  Filled 2017-09-01 (×14): qty 1

## 2017-09-01 MED ORDER — PHENYLEPHRINE 40 MCG/ML (10ML) SYRINGE FOR IV PUSH (FOR BLOOD PRESSURE SUPPORT)
PREFILLED_SYRINGE | INTRAVENOUS | Status: DC | PRN
  Administered 2017-09-01 (×2): 120 ug via INTRAVENOUS
  Administered 2017-09-01 (×2): 80 ug via INTRAVENOUS

## 2017-09-01 MED ORDER — ONDANSETRON HCL 4 MG/2ML IJ SOLN
INTRAMUSCULAR | Status: AC
  Filled 2017-09-01: qty 2

## 2017-09-01 MED ORDER — 0.9 % SODIUM CHLORIDE (POUR BTL) OPTIME
TOPICAL | Status: DC | PRN
  Administered 2017-09-01: 1000 mL

## 2017-09-01 MED ORDER — METOCLOPRAMIDE HCL 5 MG/ML IJ SOLN: 5.0000 mg | Freq: Three times a day (TID) | INTRAMUSCULAR | Status: DC | PRN

## 2017-09-01 MED ORDER — OXYCODONE HCL 5 MG/5ML PO SOLN: 5.0000 mg | Freq: Once | ORAL | Status: DC | PRN

## 2017-09-01 MED ORDER — CHLORHEXIDINE GLUCONATE 4 % EX LIQD: 60.0000 mL | Freq: Once | CUTANEOUS | Status: DC

## 2017-09-01 MED ORDER — METOCLOPRAMIDE HCL 5 MG PO TABS: 5.0000 mg | ORAL_TABLET | Freq: Three times a day (TID) | ORAL | Status: DC | PRN

## 2017-09-01 MED ORDER — TRAZODONE HCL 50 MG PO TABS
25.0000 mg | ORAL_TABLET | Freq: Every evening | ORAL | Status: DC | PRN
  Administered 2017-09-04 – 2017-09-06 (×2): 50 mg via ORAL
  Filled 2017-09-01 (×2): qty 1

## 2017-09-01 MED ORDER — PROPOFOL 10 MG/ML IV BOLUS
INTRAVENOUS | Status: AC
  Filled 2017-09-01: qty 20

## 2017-09-01 MED ORDER — DEXAMETHASONE SODIUM PHOSPHATE 10 MG/ML IJ SOLN
INTRAMUSCULAR | Status: AC
  Filled 2017-09-01: qty 1

## 2017-09-01 MED ORDER — METOPROLOL SUCCINATE ER 100 MG PO TB24
100.0000 mg | ORAL_TABLET | Freq: Every day | ORAL | Status: DC
  Administered 2017-09-01 – 2017-09-05 (×4): 100 mg via ORAL
  Filled 2017-09-01 (×6): qty 1

## 2017-09-01 MED ORDER — LACTATED RINGERS IV SOLN
INTRAVENOUS | Status: DC
  Administered 2017-09-01: 12:00:00 via INTRAVENOUS

## 2017-09-01 MED ORDER — PROPOFOL 1000 MG/100ML IV EMUL
INTRAVENOUS | Status: AC
  Filled 2017-09-01: qty 100

## 2017-09-01 MED ORDER — FENTANYL CITRATE (PF) 250 MCG/5ML IJ SOLN
INTRAMUSCULAR | Status: AC
  Filled 2017-09-01: qty 5

## 2017-09-01 MED ORDER — ENOXAPARIN SODIUM 30 MG/0.3ML ~~LOC~~ SOLN
30.0000 mg | SUBCUTANEOUS | Status: DC
  Administered 2017-09-02 – 2017-09-04 (×3): 30 mg via SUBCUTANEOUS
  Filled 2017-09-01 (×3): qty 0.3

## 2017-09-01 MED ORDER — ROCURONIUM BROMIDE 50 MG/5ML IV SOLN
INTRAVENOUS | Status: AC
  Filled 2017-09-01: qty 2

## 2017-09-01 MED ORDER — PANTOPRAZOLE SODIUM 40 MG PO TBEC
40.0000 mg | DELAYED_RELEASE_TABLET | Freq: Every day | ORAL | Status: DC
  Administered 2017-09-01 – 2017-09-06 (×6): 40 mg via ORAL
  Filled 2017-09-01 (×6): qty 1

## 2017-09-01 MED ORDER — POVIDONE-IODINE 10 % EX SWAB: 2.0000 "application " | Freq: Once | CUTANEOUS | Status: DC

## 2017-09-01 MED ORDER — FUROSEMIDE 10 MG/ML IJ SOLN
20.0000 mg | Freq: Once | INTRAMUSCULAR | Status: AC
  Administered 2017-09-01: 20 mg via INTRAVENOUS
  Filled 2017-09-01: qty 2

## 2017-09-01 MED ORDER — PHENYLEPHRINE HCL 10 MG/ML IJ SOLN
INTRAVENOUS | Status: DC | PRN
  Administered 2017-09-01: 100 ug/min via INTRAVENOUS

## 2017-09-01 MED ORDER — METHOCARBAMOL 1000 MG/10ML IJ SOLN
500.0000 mg | Freq: Four times a day (QID) | INTRAVENOUS | Status: DC | PRN
  Filled 2017-09-01: qty 5

## 2017-09-01 MED ORDER — CEFAZOLIN SODIUM-DEXTROSE 2-4 GM/100ML-% IV SOLN
2.0000 g | INTRAVENOUS | Status: AC
  Administered 2017-09-01: 2 g via INTRAVENOUS
  Filled 2017-09-01: qty 100

## 2017-09-01 MED ORDER — CEFAZOLIN SODIUM 1 G IJ SOLR
INTRAMUSCULAR | Status: AC
  Filled 2017-09-01: qty 30

## 2017-09-01 MED ORDER — SENNA 8.6 MG PO TABS
1.0000 | ORAL_TABLET | Freq: Every day | ORAL | Status: DC
  Administered 2017-09-02 – 2017-09-06 (×5): 8.6 mg via ORAL
  Filled 2017-09-01 (×5): qty 1

## 2017-09-01 MED ORDER — ENOXAPARIN SODIUM 40 MG/0.4ML ~~LOC~~ SOLN: 40.0000 mg | SUBCUTANEOUS | Status: DC

## 2017-09-01 MED ORDER — PAROXETINE HCL 10 MG PO TABS: 10.0000 mg | ORAL_TABLET | Freq: Every day | ORAL | Status: DC

## 2017-09-01 MED ORDER — DOCUSATE SODIUM 100 MG PO CAPS
100.0000 mg | ORAL_CAPSULE | Freq: Two times a day (BID) | ORAL | Status: DC
  Administered 2017-09-01 – 2017-09-06 (×9): 100 mg via ORAL
  Filled 2017-09-01 (×9): qty 1

## 2017-09-01 MED ORDER — PROPOFOL 10 MG/ML IV BOLUS
INTRAVENOUS | Status: DC | PRN
  Administered 2017-09-01: 20 mg via INTRAVENOUS
  Administered 2017-09-01: 70 mg via INTRAVENOUS
  Administered 2017-09-01: 20 mg via INTRAVENOUS

## 2017-09-01 MED ORDER — SODIUM CHLORIDE 0.9 % IV SOLN
1.0000 g | Freq: Once | INTRAVENOUS | Status: AC
  Administered 2017-09-01: 1 g via INTRAVENOUS
  Filled 2017-09-01 (×2): qty 10

## 2017-09-01 MED ORDER — METOLAZONE 2.5 MG PO TABS
2.5000 mg | ORAL_TABLET | ORAL | Status: DC
  Administered 2017-09-05: 2.5 mg via ORAL
  Filled 2017-09-01: qty 1

## 2017-09-01 MED ORDER — FENTANYL CITRATE (PF) 250 MCG/5ML IJ SOLN
INTRAMUSCULAR | Status: DC | PRN
  Administered 2017-09-01 (×3): 50 ug via INTRAVENOUS

## 2017-09-01 MED ORDER — FENTANYL CITRATE (PF) 100 MCG/2ML IJ SOLN
25.0000 ug | INTRAMUSCULAR | Status: DC | PRN
  Administered 2017-09-01 (×2): 25 ug via INTRAVENOUS

## 2017-09-01 MED ORDER — SUCCINYLCHOLINE CHLORIDE 200 MG/10ML IV SOSY
PREFILLED_SYRINGE | INTRAVENOUS | Status: DC | PRN
  Administered 2017-09-01: 60 mg via INTRAVENOUS

## 2017-09-01 MED ORDER — FUROSEMIDE 10 MG/ML IJ SOLN: 40.0000 mg | Freq: Once | INTRAMUSCULAR | Status: DC

## 2017-09-01 MED ORDER — DEXAMETHASONE SODIUM PHOSPHATE 10 MG/ML IJ SOLN
INTRAMUSCULAR | Status: DC | PRN
  Administered 2017-09-01: 4 mg via INTRAVENOUS

## 2017-09-01 MED ORDER — GLYCOPYRROLATE PF 0.2 MG/ML IJ SOSY
PREFILLED_SYRINGE | INTRAMUSCULAR | Status: AC
  Filled 2017-09-01: qty 1

## 2017-09-01 MED ORDER — CEFAZOLIN SODIUM-DEXTROSE 1-4 GM/50ML-% IV SOLN
1.0000 g | Freq: Four times a day (QID) | INTRAVENOUS | Status: DC
  Administered 2017-09-01: 1 g via INTRAVENOUS
  Filled 2017-09-01 (×3): qty 50

## 2017-09-01 MED ORDER — ONDANSETRON HCL 4 MG/2ML IJ SOLN
4.0000 mg | Freq: Four times a day (QID) | INTRAMUSCULAR | Status: DC | PRN
  Filled 2017-09-01: qty 2

## 2017-09-01 MED ORDER — OXYCODONE HCL 5 MG PO TABS: 5.0000 mg | ORAL_TABLET | Freq: Once | ORAL | Status: DC | PRN

## 2017-09-01 MED ORDER — BUPIVACAINE-EPINEPHRINE (PF) 0.5% -1:200000 IJ SOLN
INTRAMUSCULAR | Status: DC | PRN
  Administered 2017-09-01: 25 mL via PERINEURAL

## 2017-09-01 SURGICAL SUPPLY — 74 items
BANDAGE ACE 4X5 VEL STRL LF (GAUZE/BANDAGES/DRESSINGS) ×3 IMPLANT
BANDAGE ACE 6X5 VEL STRL LF (GAUZE/BANDAGES/DRESSINGS) ×3 IMPLANT
BIT DRILL LONG 3.3 (BIT) ×4 IMPLANT
BIT DRILL LONG 3.3MM (BIT) ×2
BIT DRILL QC 3.3X195 (BIT) ×3 IMPLANT
BLADE CLIPPER SURG (BLADE) IMPLANT
BNDG GAUZE ELAST 4 BULKY (GAUZE/BANDAGES/DRESSINGS) ×3 IMPLANT
BRUSH SCRUB SURG 4.25 DISP (MISCELLANEOUS) ×6 IMPLANT
CANISTER SUCT 3000ML PPV (MISCELLANEOUS) ×3 IMPLANT
CAP LOCK NCB (Cap) ×15 IMPLANT
COVER SURGICAL LIGHT HANDLE (MISCELLANEOUS) ×3 IMPLANT
DRAPE C-ARM 42X72 X-RAY (DRAPES) ×3 IMPLANT
DRAPE C-ARMOR (DRAPES) ×3 IMPLANT
DRAPE IMP U-DRAPE 54X76 (DRAPES) ×3 IMPLANT
DRAPE ORTHO SPLIT 77X108 STRL (DRAPES) ×6
DRAPE SURG ORHT 6 SPLT 77X108 (DRAPES) ×3 IMPLANT
DRAPE U-SHAPE 47X51 STRL (DRAPES) ×3 IMPLANT
DRESSING PREVENA PLUS CUSTOM (GAUZE/BANDAGES/DRESSINGS) ×1 IMPLANT
DRSG ADAPTIC 3X8 NADH LF (GAUZE/BANDAGES/DRESSINGS) ×3 IMPLANT
DRSG PAD ABDOMINAL 8X10 ST (GAUZE/BANDAGES/DRESSINGS) ×12 IMPLANT
DRSG PREVENA PLUS CUSTOM (GAUZE/BANDAGES/DRESSINGS) ×3
ELECT REM PT RETURN 9FT ADLT (ELECTROSURGICAL) ×3
ELECTRODE REM PT RTRN 9FT ADLT (ELECTROSURGICAL) ×1 IMPLANT
EVACUATOR 1/8 PVC DRAIN (DRAIN) IMPLANT
EVACUATOR 3/16  PVC DRAIN (DRAIN)
EVACUATOR 3/16 PVC DRAIN (DRAIN) IMPLANT
GAUZE SPONGE 4X4 12PLY STRL (GAUZE/BANDAGES/DRESSINGS) ×3 IMPLANT
GLOVE BIO SURGEON STRL SZ7.5 (GLOVE) ×3 IMPLANT
GLOVE BIO SURGEON STRL SZ8 (GLOVE) ×3 IMPLANT
GLOVE BIOGEL PI IND STRL 7.5 (GLOVE) ×1 IMPLANT
GLOVE BIOGEL PI IND STRL 8 (GLOVE) ×1 IMPLANT
GLOVE BIOGEL PI INDICATOR 7.5 (GLOVE) ×2
GLOVE BIOGEL PI INDICATOR 8 (GLOVE) ×2
GOWN STRL REUS W/ TWL LRG LVL3 (GOWN DISPOSABLE) ×2 IMPLANT
GOWN STRL REUS W/ TWL XL LVL3 (GOWN DISPOSABLE) ×1 IMPLANT
GOWN STRL REUS W/TWL LRG LVL3 (GOWN DISPOSABLE) ×4
GOWN STRL REUS W/TWL XL LVL3 (GOWN DISPOSABLE) ×2
K-WIRE 2.0 (WIRE) ×6
K-WIRE FXSTD 280X2XNS SS (WIRE) ×3
KIT BASIN OR (CUSTOM PROCEDURE TRAY) ×3 IMPLANT
KIT TURNOVER KIT B (KITS) ×3 IMPLANT
KWIRE FXSTD 280X2XNS SS (WIRE) ×3 IMPLANT
NEEDLE 22X1 1/2 (OR ONLY) (NEEDLE) IMPLANT
NS IRRIG 1000ML POUR BTL (IV SOLUTION) ×3 IMPLANT
PACK TOTAL JOINT (CUSTOM PROCEDURE TRAY) ×3 IMPLANT
PACK UNIVERSAL I (CUSTOM PROCEDURE TRAY) ×3 IMPLANT
PAD ARMBOARD 7.5X6 YLW CONV (MISCELLANEOUS) ×3 IMPLANT
PAD CAST 4YDX4 CTTN HI CHSV (CAST SUPPLIES) ×1 IMPLANT
PADDING CAST COTTON 4X4 STRL (CAST SUPPLIES) ×2
PADDING CAST COTTON 6X4 STRL (CAST SUPPLIES) ×3 IMPLANT
PLATE NCB PPP 9H (Plate) ×3 IMPLANT
SCREW 5.0 80MM (Screw) ×9 IMPLANT
SCREW NCB 3.5X75X5X6.2XST (Screw) ×2 IMPLANT
SCREW NCB 4.0 32MM (Screw) ×3 IMPLANT
SCREW NCB 4.0MX34M (Screw) ×9 IMPLANT
SCREW NCB 5.0X75MM (Screw) ×4 IMPLANT
SPONGE LAP 18X18 X RAY DECT (DISPOSABLE) ×3 IMPLANT
STAPLER VISISTAT 35W (STAPLE) ×3 IMPLANT
SUCTION FRAZIER HANDLE 10FR (MISCELLANEOUS) ×2
SUCTION TUBE FRAZIER 10FR DISP (MISCELLANEOUS) ×1 IMPLANT
SUT ETHILON 2 0 PSLX (SUTURE) ×3 IMPLANT
SUT PROLENE 0 CT 2 (SUTURE) IMPLANT
SUT VIC AB 0 CT1 27 (SUTURE) ×4
SUT VIC AB 0 CT1 27XBRD ANBCTR (SUTURE) ×2 IMPLANT
SUT VIC AB 1 CT1 27 (SUTURE) ×4
SUT VIC AB 1 CT1 27XBRD ANBCTR (SUTURE) ×2 IMPLANT
SUT VIC AB 2-0 CT1 27 (SUTURE) ×6
SUT VIC AB 2-0 CT1 TAPERPNT 27 (SUTURE) ×3 IMPLANT
SYR 20ML ECCENTRIC (SYRINGE) IMPLANT
TOWEL OR 17X24 6PK STRL BLUE (TOWEL DISPOSABLE) ×3 IMPLANT
TOWEL OR 17X26 10 PK STRL BLUE (TOWEL DISPOSABLE) ×6 IMPLANT
TRAY FOLEY MTR SLVR 16FR STAT (SET/KITS/TRAYS/PACK) IMPLANT
WATER STERILE IRR 1000ML POUR (IV SOLUTION) ×6 IMPLANT
YANKAUER SUCT BULB TIP NO VENT (SUCTIONS) ×3 IMPLANT

## 2017-09-01 NOTE — Plan of Care (Signed)
  Problem: Pain Management: Goal: Pain level will decrease Outcome: Progressing   Problem: Nutrition: Goal: Adequate nutrition will be maintained Outcome: Progressing   Problem: Elimination: Goal: Will not experience complications related to bowel motility Outcome: Progressing   Problem: Safety: Goal: Ability to remain free from injury will improve Outcome: Progressing   Problem: Skin Integrity: Goal: Risk for impaired skin integrity will decrease Outcome: Progressing

## 2017-09-01 NOTE — Transfer of Care (Signed)
Immediate Anesthesia Transfer of Care Note  Patient: Alyssa Cameron  Procedure(s) Performed: OPEN REDUCTION INTERNAL FIXATION (ORIF) DISTAL FEMUR FRACTURE (Right )  Patient Location: PACU  Anesthesia Type:General  Level of Consciousness: awake and alert   Airway & Oxygen Therapy: Patient Spontanous Breathing and Patient connected to nasal cannula oxygen  Post-op Assessment: Report given to RN and Post -op Vital signs reviewed and stable  Post vital signs: Reviewed and stable  Last Vitals:  Vitals Value Taken Time  BP 90/49 09/01/2017  3:42 PM  Temp    Pulse 58 09/01/2017  3:42 PM  Resp 11 09/01/2017  3:42 PM  SpO2 97 % 09/01/2017  3:42 PM  Vitals shown include unvalidated device data.  Last Pain:  Vitals:   09/01/17 1142  TempSrc:   PainSc: 4       Patients Stated Pain Goal: 0 (09/01/17 0350)  Complications: No apparent anesthesia complications

## 2017-09-01 NOTE — Anesthesia Preprocedure Evaluation (Addendum)
Anesthesia Evaluation  Patient identified by MRN, date of birth, ID band Patient awake    Reviewed: Allergy & Precautions, NPO status , Patient's Chart, lab work & pertinent test results  History of Anesthesia Complications Negative for: history of anesthetic complications  Airway Mallampati: II  TM Distance: >3 FB Neck ROM: Full    Dental  (+) Teeth Intact, Dental Advisory Given   Pulmonary  pulm HTN   breath sounds clear to auscultation       Cardiovascular hypertension, +CHF  + dysrhythmias Atrial Fibrillation  Rhythm:Irregular Rate:Tachycardia     Neuro/Psych negative neurological ROS  negative psych ROS   GI/Hepatic negative GI ROS, Neg liver ROS,   Endo/Other  diabetes  Renal/GU Renal InsufficiencyRenal disease  negative genitourinary   Musculoskeletal  (+) Arthritis ,   Abdominal   Peds  Hematology  (+) anemia , anticoagulated   Anesthesia Other Findings   Reproductive/Obstetrics                            Anesthesia Physical Anesthesia Plan  ASA: IV  Anesthesia Plan: General and Regional   Post-op Pain Management:  Regional for Post-op pain   Induction: Intravenous  PONV Risk Score and Plan: 3 and Ondansetron, Dexamethasone and Treatment may vary due to age or medical condition  Airway Management Planned: Oral ETT  Additional Equipment:   Intra-op Plan:   Post-operative Plan: Possible Post-op intubation/ventilation  Informed Consent: I have reviewed the patients History and Physical, chart, labs and discussed the procedure including the risks, benefits and alternatives for the proposed anesthesia with the patient or authorized representative who has indicated his/her understanding and acceptance.   Dental advisory given  Plan Discussed with: CRNA and Surgeon  Anesthesia Plan Comments:        Anesthesia Quick Evaluation

## 2017-09-01 NOTE — Consult Note (Signed)
Orthopaedic Trauma Service (OTS) Consult   Patient ID: Alyssa Cameron MRN: [id number] DOB/AGE: [birth year] 84 y.o.  Reason for Consult:Right distal femur fracture Referring Physician: Dr. Teryl Lucy, MD [facility]  HPI: Alyssa Cameron is an 84 y.o. female who is being seen in consultation at the request of Dr. Dion Saucier for evaluation of right femur fracture. Patient was reaching down to get a tissue when she tripped and fell and injured her right leg. She was brought to ER where she was found to have a distal femur fracture.  At baseline she lives in a assisted living facility.  She ambulates with a rolling walker at baseline.  But she lives mostly independently.  After her injury she had severe pain in her leg.  Any motion causes significant increase in pain.  She denies any other injuries to her left lower extremity or bilateral upper extremities.  She denies any fevers or chills or any new symptoms/medical problems.  She denies any numbness or tingling in her lower extremity.  Pain is improved with pain medication as well as not moving the leg.  She rates the pain as a 9-10 out of 10.  The patient is on Eliquis for atrial fibrillation.  Past Medical History:  Diagnosis Date  . A-fib (HCC)   . Arthritis   . CHF (congestive heart failure) (HCC)   . Diverticulitis   . Frequent UTI   . History of colon polyps   . Hx of colonic polyps    2013 - 2 tubular adenoma sigmoid and Transverse polyp  . Hypertension   . Internal hemorrhoids   . Pulmonary hypertension (HCC) 01/07/2015  . Spinal stenosis   . Urine incontinence     Past Surgical History:  Procedure Laterality Date  . APPENDECTOMY  1941  . BREAST BIOPSY  2010  . CATARACT EXTRACTION    . GALLBLADDER SURGERY  1970  . TONSILLECTOMY AND ADENOIDECTOMY  1941  . VAGINAL HYSTERECTOMY      Family History  Problem Relation Age of Onset  . Heart failure Mother        41  . Heart failure Father        21  . Breast cancer  Unknown   . Lung cancer Unknown   . Stroke Unknown     Social History:  reports that she has never smoked. She has never used smokeless tobacco. She reports that she does not drink alcohol or use drugs.  Allergies:  Allergies  Allergen Reactions  . Demerol [Meperidine] Nausea Only  . Vicodin [Hydrocodone-Acetaminophen] Nausea And Vomiting    Medications:  No current facility-administered medications on file prior to encounter.    Current Outpatient Medications on File Prior to Encounter  Medication Sig Dispense Refill  . acetaminophen (TYLENOL) 500 MG tablet Take 500-1,000 mg by mouth every 8 (eight) hours as needed for moderate pain.     Marland Kitchen apixaban (ELIQUIS) 2.5 MG TABS tablet Take 1 tablet (2.5 mg total) by mouth 2 (two) times daily. 60 tablet   . CRANBERRY PO Take 1 tablet by mouth daily.    . furosemide (LASIX) 20 MG tablet Take 1 tablet (20 mg total) by mouth daily. Take with the 40 mg tablet 90 tablet 3  . furosemide (LASIX) 40 MG tablet TAKE 1 TABLET BY MOUTH  DAILY 90 tablet 3  . lisinopril (PRINIVIL,ZESTRIL) 20 MG tablet Take 1 tablet (20 mg total) by mouth daily. 90 tablet 3  . metolazone (ZAROXOLYN) 2.5 MG tablet Take  1 tablet (2.5 mg total) by mouth once a week. Take weekly on tuesday 4 tablet 3  . metoprolol succinate (TOPROL-XL) 100 MG 24 hr tablet TAKE 1 TABLET BY MOUTH  DAILY WITH BREAKFAST 90 tablet 1  . ondansetron (ZOFRAN) 4 MG tablet Take 1 tablet (4 mg total) by mouth every 8 (eight) hours as needed for nausea or vomiting. 20 tablet 0  . pantoprazole (PROTONIX) 40 MG tablet TAKE 1 TABLET BY MOUTH  DAILY 90 tablet 1  . potassium chloride SA (K-DUR,KLOR-CON) 20 MEQ tablet TAKE 1 TABLET BY MOUTH  EVERY DAY 90 tablet 1  . traZODone (DESYREL) 50 MG tablet TAKE 0.5-1 TABLETS (25-50 MG TOTAL) BY MOUTH AT BEDTIME AS NEEDED FOR SLEEP. (Patient taking differently: Take 25 mg by mouth at bedtime as needed for sleep. ) 90 tablet 0  . PARoxetine (PAXIL) 10 MG tablet Take 1  tablet (10 mg total) by mouth daily. (Patient not taking: Reported on 09/01/2017) 30 tablet 0    ROS: Constitutional: No fever or chills Vision: No changes in vision ENT: No difficulty swallowing CV: No chest pain Pulm: No SOB or wheezing GI: No nausea or vomiting GU: No urgency or inability to hold urine Skin: No poor wound healing Neurologic: No numbness or tingling Psychiatric: No depression or anxiety Heme: No bruising Allergic: No reaction to medications or food   Exam: Blood pressure (!) 107/45, pulse 79, temperature (!) 97.5 F (36.4 C), temperature source Oral, resp. rate 15, SpO2 100 %. General:Significant discomfort in bed, in aquite a bit of pain Orientation:Awake and alert and oriented to person, place and time Mood and Affect: Cooperative and pleasant Gait: Unable to ambulate due to pain Coordination and balance: Within normal limits  Right lower extremity: Reveals notable bruising about the leg.  There is no skin lesions.  Her leg is held in a flexed and rotated position.  An attempt was made to straighten it out in the bed to try to improve her pain.  She has a mild pitting edema.  She has a warm well-perfused foot with 2+ DP pulses.  She has sensation intact to light touch to the dorsum and plantar aspect of her leg.  She otherwise it was not able to tolerate any further range of motion of the ankle or hip.  Left lower extremity: Reveals skin without lesions.  No tenderness palpation.  Full range of motion with full strength in each muscle groups no evidence of instability.   Medical Decision Making: Imaging: X-rays of the right femur show a distal femoral fracture with extension into the articular surface.  CT scan was reviewed as well.  It has a simple intra-articular split between the condyles.  The femoral shaft is impacted into the metaphysis.  There is significant displacement and rotation of the fracture fragments.  She has pre-existing tricompartmental  degenerative changes in her knee joint.  Labs:  Results for orders placed or performed during the hospital encounter of 08/31/17 (from the past 24 hour(s))  ABO/Rh     Status: None   Collection Time: 08/31/17 11:00 PM  Result Value Ref Range   ABO/RH(D)      O POS Performed at [facility], [address] [place], [place] [address]   CBC with Differential     Status: Abnormal   Collection Time: 08/31/17 11:07 PM  Result Value Ref Range   WBC 6.7 4.0 - 10.5 K/uL   RBC 3.27 (L) 3.87 - 5.11 MIL/uL  Hemoglobin 9.3 (L) 12.0 - 15.0 g/dL   HCT 16.130.8 (L) 09.636.0 - 04.546.0 %   MCV 94.2 78.0 - 100.0 fL   MCH 28.4 26.0 - 34.0 pg   MCHC 30.2 30.0 - 36.0 g/dL   RDW 40.918.1 (H) 81.111.5 - 91.415.5 %   Platelets 234 150 - 400 K/uL   Neutrophils Relative % 74 %   Neutro Abs 5.0 1.7 - 7.7 K/uL   Lymphocytes Relative 13 %   Lymphs Abs 0.8 0.7 - 4.0 K/uL   Monocytes Relative 11 %   Monocytes Absolute 0.7 0.1 - 1.0 K/uL   Eosinophils Relative 1 %   Eosinophils Absolute 0.1 0.0 - 0.7 K/uL   Basophils Relative 1 %   Basophils Absolute 0.1 0.0 - 0.1 K/uL  Basic metabolic panel     Status: Abnormal   Collection Time: 08/31/17 11:07 PM  Result Value Ref Range   Sodium 138 135 - 145 mmol/L   Potassium 5.0 3.5 - 5.1 mmol/L   Chloride 104 101 - 111 mmol/L   CO2 24 22 - 32 mmol/L   Glucose, Bld 202 (H) 65 - 99 mg/dL   BUN 44 (H) 6 - 20 mg/dL   Creatinine, Ser 7.821.53 (H) 0.44 - 1.00 mg/dL   Calcium 9.4 8.9 - 95.610.3 mg/dL   GFR calc non Af Amer 28 (L) >60 mL/min   GFR calc Af Amer 32 (L) >60 mL/min   Anion gap 10 5 - 15  Protime-INR     Status: Abnormal   Collection Time: 08/31/17 11:07 PM  Result Value Ref Range   Prothrombin Time 17.6 (H) 11.4 - 15.2 seconds   INR 1.46   Type and screen     Status: None   Collection Time: 08/31/17 11:07 PM  Result Value Ref Range   ABO/RH(D) O POS    Antibody Screen NEG    Sample Expiration      09/03/2017 Performed at [facility],  [address] [place], [place] [address]   Brain natriuretic peptide     Status: Abnormal   Collection Time: 08/31/17 11:07 PM  Result Value Ref Range   B Natriuretic Peptide 472.5 (H) 0.0 - 100.0 pg/mL  Surgical pcr screen     Status: Abnormal   Collection Time: 09/01/17  3:59 AM  Result Value Ref Range   MRSA, PCR NEGATIVE NEGATIVE   Staphylococcus aureus POSITIVE (A) NEGATIVE  Urinalysis, Routine w reflex microscopic     Status: Abnormal   Collection Time: 09/01/17  4:50 AM  Result Value Ref Range   Color, Urine YELLOW YELLOW   APPearance CLOUDY (A) CLEAR   Specific Gravity, Urine 1.009 1.005 - 1.030   pH 5.0 5.0 - 8.0   Glucose, UA NEGATIVE NEGATIVE mg/dL   Hgb urine dipstick MODERATE (A) NEGATIVE   Bilirubin Urine NEGATIVE NEGATIVE   Ketones, ur NEGATIVE NEGATIVE mg/dL   Protein, ur NEGATIVE NEGATIVE mg/dL   Nitrite NEGATIVE NEGATIVE   Leukocytes, UA LARGE (A) NEGATIVE   RBC / HPF 21-50 0 - 5 RBC/hpf   WBC, UA 21-50 0 - 5 WBC/hpf   Bacteria, UA RARE (A) NONE SEEN   Squamous Epithelial / LPF 0-5 0 - 5   WBC Clumps PRESENT    Hyaline Casts, UA PRESENT   Prepare RBC     Status: None   Collection Time: 09/01/17  8:06 AM  Result Value Ref Range   Order Confirmation      ORDER PROCESSED BY  BLOOD BANK Performed at [facility], [address] [place], [place] [address]   Type and screen [facility]     Status: None (Preliminary result)   Collection Time: 09/01/17  8:06 AM  Result Value Ref Range   ABO/RH(D) O POS    Antibody Screen NEG    Sample Expiration 09/04/2017    Unit Number [id number]    Blood Component Type RED CELLS,LR    Unit division 00    Status of Unit ALLOCATED    Transfusion Status OK TO TRANSFUSE    Crossmatch Result      Compatible Performed at [facility], [address] [place], [place] [address]    Unit Number [id number]    Blood Component Type RED CELLS,LR    Unit division 00    Status of Unit  ALLOCATED    Transfusion Status OK TO TRANSFUSE    Crossmatch Result Compatible   ABO/Rh     Status: None   Collection Time: 09/01/17  8:06 AM  Result Value Ref Range   ABO/RH(D)      O POS Performed at [facility], [address], [place], [place] [address]    Medical history and chart was reviewed  Assessment: 84 year old female with a history of hypertension, diastolic congestive heart failure with atrial fibrillation on chronic anticoagulation with Eliquis and chronic kidney disease  Injuries: Right intra-articular distal femur fracture with significant osteoporosis   Patient has a very difficult injury.  She has significant medical problems and is on anticoagulation.  Her femur fracture requires surgical management for pain control as well as mobilization and likely ability to ambulate in the future.  I discussed risks and benefits with the patient and her daughter.  I feel that proceeding with surgery even Risks discussed included bleeding requiring blood transfusion, bleeding causing a hematoma, infection, malunion, nonunion, damage to surrounding nerves and blood vessels, pain, hardware prominence or irritation, hardware failure, stiffness, post-traumatic arthritis, DVT/PE, compartment syndrome, and even death.  In light of the recent Eliquis outweighs the risks of laying in bed for another few days while we allow her Eliquis to metabolize.  Patient will require open reduction and internal fixation with lateral plating.  She will likely be touchdown weightbearing for 6 to 8 weeks.  Patient has significant risk for  CV/Blood loss: Acute blood loss anemia and postoperative transfusion.  I have typed and crossed her for 2 units for surgery today.  Pain management: Currently receiving hydrocodone and morphine as well as Robaxin for pain control.  We will have to monitor her narcotic use closely due to her age and mental status.  VTE prophylaxis: We will hold her Eliquis for now.   She will likely be started on this either postoperative day 1 or 2.  This will be discretion of Dr. Carola Frost.  ID: Will require postoperative Ancef.  Impediments to Fracture Healing: Multiple medical comorbidities and osteoporosis.  Dispo: To be determined but likely will need skilled nursing facility placement  I discussed her care with her daughter who will be arriving later this afternoon.  We will plan to proceed with surgery when she is arrived.  Dr. Carola Frost will take over her care and perform the surgery due to OR availability.  I have discussed and relayed all this information to him.  Greater than 70 minutes was spent at bedside reviewing images discussing case with Dr. Dion Saucier and the hospitalist. Dr. Clearnce Sorrel, discussing risks and benefits  with the patient and her daughter.  Roby Lofts, MD [facility] [phone] (phone)

## 2017-09-01 NOTE — Anesthesia Procedure Notes (Signed)
Anesthesia Regional Block: Femoral nerve block   Pre-Anesthetic Checklist: ,, timeout performed, Correct Patient, Correct Site, Correct Laterality, Correct Procedure, Correct Position, site marked, Risks and benefits discussed,  Surgical consent,  Pre-op evaluation,  At surgeon's request and post-op pain management  Laterality: Lower and Right  Prep: chloraprep       Needles:  Injection technique: Single-shot  Needle Type: Echogenic Stimulator Needle          Additional Needles:   Procedures:,,,, ultrasound used (permanent image in chart),,,,  Narrative:  Start time: 09/01/2017 1:24 PM End time: 09/01/2017 1:27 PM Injection made incrementally with aspirations every 5 mL.  Performed by: Personally  Anesthesiologist: Val EagleMoser, Darcy Barbara, MD  Additional Notes: H+P and labs reviewed, risks and benefits discussed with patient, procedure tolerated well without complications

## 2017-09-01 NOTE — Progress Notes (Signed)
Orthopedic Tech Progress Note Patient Details:  Alyssa Cameron [birth year] [id number]  Musculoskeletal Traction Type of Traction: Bucks Skin Traction Traction Location: Right Traction Weight: 10 lbs   Post Interventions Patient Tolerated: Well Instructions Provided: Care of device   Saul FordyceJennifer C Raeley Gilmore 09/01/2017, 9:14 AM

## 2017-09-01 NOTE — Progress Notes (Signed)
I have seen and examined the patient. I agree with the findings and extensive evaluation by Dr. Jena GaussHaddix by whom I have been fully briefed.   Intraarticular displaced right distal femur fracture for ORIF.  I discussed with the patient and her granddaughter the risks and benefits of surgery, including the possibility of infection, nerve injury, vessel injury, wound breakdown, arthritis, symptomatic hardware, DVT/ PE, loss of motion, malunion, nonunion, and need for further surgery among others.  We also specifically discussed the elevated risk of perioperative complications given her comorbidities and age. She acknowledged these risks and wished to proceed.    Budd PalmerHANDY,Del Overfelt H, MD 09/01/2017 12:56 PM

## 2017-09-01 NOTE — Progress Notes (Addendum)
PROGRESS NOTE    Alyssa Cameron  ZOX:[id number] DOB: [birth year] DOA: 08/31/2017 PCP: Alyssa Frees, NP    Brief Narrative:  84 year old with past medical history relevant for hypertension, diastolic heart failure with EF of 65 to 70% on 12/2014, stage IV CKD, anemia due to CKD, atrial fibrillation on apixaban, stage IV chronic kidney disease who presented to the emergency department at [facility] after a fall and found to have a distal femur fracture on the right.  Patient was transferred to [facility] for surgical repair.   Assessment & Plan:   Principal Problem:   Femur fracture, right (HCC) Active Problems:   Chronic atrial fibrillation (HCC)   Essential hypertension   CHF (congestive heart failure) (HCC)   CKD (chronic kidney disease), stage IV (HCC)   Anemia of chronic disease   Falls, initial encounter   #) Mechanical fall complicated by right distal femur fracture: Patient is pending repair in the OR today.  Medically at least her blood work and vitals are reassuring.  She did have some mild pulmonary congestion on chest x-ray however otherwise she appears to been well compensated heart failure.  Exline-orthopedic consult, appreciate recommendations -Physical therapy afterwards  #) Chronic atrial fibrillation: - Apixaban -Continue metoprolol 6 800 mg daily   #) Chronic diastolic heart failure: Currently patient appears to be euvolemic - Continue metolazone 2.5 mg every Tuesday - Hold home furosemide 40 mg daily  #) Pain/psych: - Continue paroxetine 10 mg daily  #) CKD stage IV complicated by hypoproliferative anemia: Stable   Fluids: Restrict Elect lites: Monitor and supplement Nutrition: N.p.o., then transition to heart healthy diet  Prophylaxis: Enoxaparin  Disposition: Pending operative repair and evaluation by PT  Full code  Consultants:   Orthopedic surgery  Procedures:   None  Antimicrobials:   Perioperative antibiotics   Subjective: Patient  reports she is in some pain in her right leg.  She reports being quite nauseous but does not have any emesis.  She denies any fevers, abdominal pain, chest pain, shortness of breath.  Objective: Vitals:   09/01/17 0200 09/01/17 0230 09/01/17 0315 09/01/17 0833  BP: (!) 112/47 110/65 106/77 (!) 107/45  Pulse: 69 (!) 112 83 79  Resp: 20 11 15    Temp:   97.6 F (36.4 C) (!) 97.5 F (36.4 C)  TempSrc:   Axillary Oral  SpO2: 100% 98% 96% 100%   No intake or output data in the 24 hours ending 09/01/17 1012 There were no vitals filed for this visit.  Examination:  General exam: No acute distress Respiratory system: No increased work of breathing, diminished lung sounds at bases, scattered rhonchi Cardiovascular system: Distant heart sounds, irregularly regular, no murmurs Gastrointestinal system: Abdomen is nondistended, soft and nontender. No organomegaly or masses felt. Normal bowel sounds heard. Central nervous system: Alert and oriented. No focal neurological deficits. Extremities: Right lower extremity held in externally rotated and flexed position with intact distal pulses and good cap refill Skin: No rashes on visible skin Psychiatry: Judgement and insight appear normal. Mood & affect appropriate.     Data Reviewed: I have personally reviewed following labs and imaging studies  CBC: Recent Labs  Lab 08/31/17 2307  WBC 6.7  NEUTROABS 5.0  HGB 9.3*  HCT 30.8*  MCV 94.2  PLT 234   Basic Metabolic Panel: Recent Labs  Lab 08/31/17 2307  NA 138  K 5.0  CL 104  CO2 24  GLUCOSE 202*  BUN 44*  CREATININE 1.53*  CALCIUM 9.4  GFR: CrCl cannot be calculated (Unknown ideal weight.). Liver Function Tests: No results for input(s): AST, ALT, ALKPHOS, BILITOT, PROT, ALBUMIN in the last 168 hours. No results for input(s): LIPASE, AMYLASE in the last 168 hours. No results for input(s): AMMONIA in the last 168 hours. Coagulation Profile: Recent Labs  Lab 08/31/17 2307    INR 1.46   Cardiac Enzymes: No results for input(s): CKTOTAL, CKMB, CKMBINDEX, TROPONINI in the last 168 hours. BNP (last 3 results) No results for input(s): PROBNP in the last 8760 hours. HbA1C: No results for input(s): HGBA1C in the last 72 hours. CBG: No results for input(s): GLUCAP in the last 168 hours. Lipid Profile: No results for input(s): CHOL, HDL, LDLCALC, TRIG, CHOLHDL, LDLDIRECT in the last 72 hours. Thyroid Function Tests: No results for input(s): TSH, T4TOTAL, FREET4, T3FREE, THYROIDAB in the last 72 hours. Anemia Panel: No results for input(s): VITAMINB12, FOLATE, FERRITIN, TIBC, IRON, RETICCTPCT in the last 72 hours. Sepsis Labs: No results for input(s): PROCALCITON, LATICACIDVEN in the last 168 hours.  Recent Results (from the past 240 hour(s))  Surgical pcr screen     Status: Abnormal   Collection Time: 09/01/17  3:59 AM  Result Value Ref Range Status   MRSA, PCR NEGATIVE NEGATIVE Final   Staphylococcus aureus POSITIVE (A) NEGATIVE Final    Comment: (NOTE) The Xpert SA Assay (FDA approved for NASAL specimens in patients 82 years of age and older), is one component of a comprehensive surveillance program. It is not intended to diagnose infection nor to guide or monitor treatment. Performed at [facility], [address] [place], [place] [address]          Radiology Studies: Dg Chest 1 View  Result Date: 08/31/2017 CLINICAL DATA:  Initial preoperative evaluation. EXAM: CHEST  1 VIEW COMPARISON:  Prior radiograph from 07/28/2017. FINDINGS: Cardiomegaly, stable. Mediastinal silhouette within normal limits. Aortic atherosclerosis. Lungs hypoinflated. Diffuse pulmonary vascular congestion with interstitial prominence, suggesting mild pulmonary interstitial congestion/edema. Superimposed mild left basilar scarring, similar to previous. No focal infiltrates. No pleural effusion. No pneumothorax. No acute osseous abnormality. IMPRESSION: 1. Cardiomegaly  with mild diffuse pulmonary interstitial congestion/edema. 2. Aortic atherosclerosis. Electronically Signed   By: Rise MuBenjamin  McClintock M.D.   On: 08/31/2017 22:53   Ct Knee Right Wo Contrast  Result Date: 09/01/2017 CLINICAL DATA:  Patient stumbled and fell. Right knee fracture seen on radiographs. EXAM: CT OF THE right KNEE WITHOUT CONTRAST TECHNIQUE: Multidetector CT imaging of the right knee was performed according to the standard protocol. Multiplanar CT image reconstructions were also generated. COMPARISON:  Right knee radiograph 08/31/2017 FINDINGS: Bones/Joint/Cartilage Markedly comminuted fractures of the distal right radial metaphysis with extension to the trochlea. Fracture lines extend also to the patella femoral articular surface. There is impaction of the fracture fragments with posterior angulation of the major distal fragments and displacement of multiple cortical fragments. Mild lateral subluxation of the tibia with respect to the femoral heads. Proximal tibia and fibula appear intact. Underlying chronic degenerative changes with medial and lateral compartment narrowing and 3 compartment osteophyte formation. Chondrocalcinosis. Moderate-sized joint effusion. Ligaments Suboptimally assessed by CT. Muscles and Tendons Fatty atrophy of the musculature. No intramuscular hematoma identified. Soft tissues Diffuse soft tissue edema throughout the subcutaneous fatty tissues. Vascular calcifications in the popliteal artery and tibial trunk. IMPRESSION: Markedly comminuted fractures of the distal right radial metaphysis extending to the trochlea and patella femoral articular surface. Impaction of fracture fragments with posterior angulation of the major distal fragments and  displacement of multiple cortical fragments. Mild lateral subluxation of the tibia with respect to the femoral head fragments. Moderate-sized effusion and diffuse edema. Chronic tricompartment degenerative changes in the knee.  Electronically Signed   By: Burman Nieves M.D.   On: 09/01/2017 01:00   Dg Knee Complete 4 Views Right  Result Date: 08/31/2017 CLINICAL DATA:  [facility] Seat at nursing home.  Obvious deformity. EXAM: RIGHT KNEE - COMPLETE 4+ VIEW COMPARISON:  None. FINDINGS: Acute comminuted impacted distal femur fracture with intra-articular extension through the trochlea. Posterior angulation distal bony fragments. No dislocation. No destructive bony lesions. Osteopenia. Soft tissue swelling and joint effusion. Mild vascular calcifications. IMPRESSION: Acute displaced distal femur fracture.  No dislocation. Electronically Signed   By: Awilda Metro M.D.   On: 08/31/2017 22:42        Scheduled Meds: . chlorhexidine  60 mL Topical Once  . [START ON 09/05/2017] metolazone  2.5 mg Oral Weekly  . metoprolol succinate  100 mg Oral Q breakfast  . pantoprazole  40 mg Oral Daily  . povidone-iodine  2 application Topical Once  . senna  1 tablet Oral Daily   Continuous Infusions: .  ceFAZolin (ANCEF) IV    . methocarbamol (ROBAXIN)  IV       LOS: 0 days    Time spent: 35    Delaine Lame, MD [facility]   If 7PM-7AM, please contact night-coverage [email or web address] Password [email or web address] 09/01/2017, 10:12 AM

## 2017-09-01 NOTE — ED Notes (Signed)
ED TO INPATIENT HANDOFF REPORT  Name/Age/Gender Alyssa Cameron 84 y.o. female  Code Status    Code Status Orders  (From admission, onward)        Start     Ordered   09/01/17 0112  Full code  Continuous     09/01/17 0124    Code Status History    This patient has a current code status but no historical code status.      Home/SNF/Other Nursing Home  Chief Complaint fall  Level of Care/Admitting Diagnosis ED Disposition    ED Disposition Condition [facility] Area: [place] [[id number]]  Level of Care: Telemetry [5]  Diagnosis: Femur fracture, right [facility]) [[id number]]  Admitting Physician: Norval Morton [[id number]]  Attending Physician: Norval Morton [[id number]]  Estimated length of stay: 3 - 4 days  Certification:: I certify this patient will need inpatient services for at least 2 midnights  PT Class (Do Not Modify): Inpatient [101]  PT Acc Code (Do Not Modify): Private [1]       Medical History Past Medical History:  Diagnosis Date  . A-fib ([place])   . Arthritis   . CHF (congestive heart failure) ([place])   . Diverticulitis   . Frequent UTI   . History of colon polyps   . Hx of colonic polyps    2013 - 2 tubular adenoma sigmoid and Transverse polyp  . Hypertension   . Internal hemorrhoids   . Pulmonary hypertension ([place]) 01/07/2015  . Spinal stenosis   . Urine incontinence     Allergies Allergies  Allergen Reactions  . Demerol [Meperidine] Nausea Only  . Vicodin [Hydrocodone-Acetaminophen] Nausea And Vomiting    IV Location/Drains/Wounds Patient Lines/Drains/Airways Status   Active Line/Drains/Airways    Name:   Placement date:   Placement time:   Site:   Days:   Peripheral IV 08/31/17 Left Antecubital   08/31/17    -    Antecubital   1          Labs/Imaging Results for orders placed or performed during the hospital encounter of 08/31/17 (from the past 48 hour(s))  CBC with Differential     Status: Abnormal    Collection Time: 08/31/17 11:07 PM  Result Value Ref Range   WBC 6.7 4.0 - 10.5 K/uL   RBC 3.27 (L) 3.87 - 5.11 MIL/uL   Hemoglobin 9.3 (L) 12.0 - 15.0 g/dL   HCT 30.8 (L) 36.0 - 46.0 %   MCV 94.2 78.0 - 100.0 fL   MCH 28.4 26.0 - 34.0 pg   MCHC 30.2 30.0 - 36.0 g/dL   RDW 18.1 (H) 11.5 - 15.5 %   Platelets 234 150 - 400 K/uL   Neutrophils Relative % 74 %   Neutro Abs 5.0 1.7 - 7.7 K/uL   Lymphocytes Relative 13 %   Lymphs Abs 0.8 0.7 - 4.0 K/uL   Monocytes Relative 11 %   Monocytes Absolute 0.7 0.1 - 1.0 K/uL   Eosinophils Relative 1 %   Eosinophils Absolute 0.1 0.0 - 0.7 K/uL   Basophils Relative 1 %   Basophils Absolute 0.1 0.0 - 0.1 K/uL    Comment: Performed at [facility], [place] [address] [place], [place] [address]  Basic metabolic panel     Status: Abnormal   Collection Time: 08/31/17 11:07 PM  Result Value Ref Range   Sodium 138 135 - 145 mmol/L   Potassium 5.0 3.5 - 5.1 mmol/L  Chloride 104 101 - 111 mmol/L   CO2 24 22 - 32 mmol/L   Glucose, Bld 202 (H) 65 - 99 mg/dL   BUN 44 (H) 6 - 20 mg/dL   Creatinine, Ser 1.53 (H) 0.44 - 1.00 mg/dL   Calcium 9.4 8.9 - 10.3 mg/dL   GFR calc non Af Amer 28 (L) >60 mL/min   GFR calc Af Amer 32 (L) >60 mL/min    Comment: (NOTE) The eGFR has been calculated using the CKD EPI equation. This calculation has not been validated in all clinical situations. eGFR's persistently <60 mL/min signify possible Chronic Kidney Disease.    Anion gap 10 5 - 15    Comment: Performed at [facility], [place] [address] [place], [place] [address]  Protime-INR     Status: Abnormal   Collection Time: 08/31/17 11:07 PM  Result Value Ref Range   Prothrombin Time 17.6 (H) 11.4 - 15.2 seconds   INR 1.46     Comment: Performed at [facility], [place] [address] [place], [place] [address]  Type and screen     Status: None   Collection Time: 08/31/17 11:07 PM  Result Value Ref Range   ABO/RH(D) O  POS    Antibody Screen NEG    Sample Expiration      09/03/2017 Performed at [facility], [place] [address] [name], [place] [address]    Dg Chest 1 View  Result Date: 08/31/2017 CLINICAL DATA:  Initial preoperative evaluation. EXAM: CHEST  1 VIEW COMPARISON:  Prior radiograph from 07/28/2017. FINDINGS: Cardiomegaly, stable. Mediastinal silhouette within normal limits. Aortic atherosclerosis. Lungs hypoinflated. Diffuse pulmonary vascular congestion with interstitial prominence, suggesting mild pulmonary interstitial congestion/edema. Superimposed mild left basilar scarring, similar to previous. No focal infiltrates. No pleural effusion. No pneumothorax. No acute osseous abnormality. IMPRESSION: 1. Cardiomegaly with mild diffuse pulmonary interstitial congestion/edema. 2. Aortic atherosclerosis. Electronically Signed   By: Jeannine Boga M.D.   On: 08/31/2017 22:53   Ct Knee Right Wo Contrast  Result Date: 09/01/2017 CLINICAL DATA:  Patient stumbled and fell. Right knee fracture seen on radiographs. EXAM: CT OF THE right KNEE WITHOUT CONTRAST TECHNIQUE: Multidetector CT imaging of the right knee was performed according to the standard protocol. Multiplanar CT image reconstructions were also generated. COMPARISON:  Right knee radiograph 08/31/2017 FINDINGS: Bones/Joint/Cartilage Markedly comminuted fractures of the distal right radial metaphysis with extension to the trochlea. Fracture lines extend also to the patella femoral articular surface. There is impaction of the fracture fragments with posterior angulation of the major distal fragments and displacement of multiple cortical fragments. Mild lateral subluxation of the tibia with respect to the femoral heads. Proximal tibia and fibula appear intact. Underlying chronic degenerative changes with medial and lateral compartment narrowing and 3 compartment osteophyte formation. Chondrocalcinosis. Moderate-sized joint effusion.  Ligaments Suboptimally assessed by CT. Muscles and Tendons Fatty atrophy of the musculature. No intramuscular hematoma identified. Soft tissues Diffuse soft tissue edema throughout the subcutaneous fatty tissues. Vascular calcifications in the popliteal artery and tibial trunk. IMPRESSION: Markedly comminuted fractures of the distal right radial metaphysis extending to the trochlea and patella femoral articular surface. Impaction of fracture fragments with posterior angulation of the major distal fragments and displacement of multiple cortical fragments. Mild lateral subluxation of the tibia with respect to the femoral head fragments. Moderate-sized effusion and diffuse edema. Chronic tricompartment degenerative changes in the knee. Electronically Signed   By: Lucienne Capers M.D.   On: 09/01/2017 01:00  Dg Knee Complete 4 Views Right  Result Date: 08/31/2017 CLINICAL DATA:  [facility] at nursing home.  Obvious deformity. EXAM: RIGHT KNEE - COMPLETE 4+ VIEW COMPARISON:  None. FINDINGS: Acute comminuted impacted distal femur fracture with intra-articular extension through the trochlea. Posterior angulation distal bony fragments. No dislocation. No destructive bony lesions. Osteopenia. Soft tissue swelling and joint effusion. Mild vascular calcifications. IMPRESSION: Acute displaced distal femur fracture.  No dislocation. Electronically Signed   By: Elon Alas M.D.   On: 08/31/2017 22:42    Pending Labs Unresulted Labs (From admission, onward)   Start     Ordered   09/02/17 9826  Basic metabolic panel  Daily,   R     09/01/17 0124   09/01/17 0500  CBC  Tomorrow morning,   R     09/01/17 0124   09/01/17 0126  Brain natriuretic peptide  Add-on,   R     09/01/17 0125   08/31/17 2300  ABO/Rh  Once,   R     08/31/17 2300      Vitals/Pain Today's Vitals   09/01/17 0030 09/01/17 0049 09/01/17 0100 09/01/17 0130  BP: 134/80  101/62 (!) 102/58  Pulse: 60  (!) 36 93  Resp: '12  13 17  '$ Temp:       TempSrc:      SpO2: 100%  98% 98%  PainSc:  5       Isolation Precautions No active isolations  Medications Medications  HYDROcodone-acetaminophen (NORCO/VICODIN) 5-325 MG per tablet 1-2 tablet (has no administration in time range)  morphine 2 MG/ML injection 0.5 mg (has no administration in time range)  methocarbamol (ROBAXIN) tablet 500 mg (has no administration in time range)    Or  methocarbamol (ROBAXIN) 500 mg in dextrose 5 % 50 mL IVPB (has no administration in time range)  senna (SENOKOT) tablet 8.6 mg (has no administration in time range)  metoprolol succinate (TOPROL-XL) 24 hr tablet 100 mg (has no administration in time range)  pantoprazole (PROTONIX) EC tablet 40 mg (has no administration in time range)  traZODone (DESYREL) tablet 25-50 mg (has no administration in time range)  PARoxetine (PAXIL) tablet 10 mg (has no administration in time range)  metolazone (ZAROXOLYN) tablet 2.5 mg (has no administration in time range)  furosemide (LASIX) injection 20 mg (has no administration in time range)  prochlorperazine (COMPAZINE) injection 10 mg (has no administration in time range)  morphine 4 MG/ML injection 4 mg (4 mg Intravenous Given 08/31/17 2349)  ondansetron (ZOFRAN) injection 4 mg (4 mg Intravenous Given 08/31/17 2349)  ondansetron (ZOFRAN) injection 4 mg (4 mg Intravenous Given 09/01/17 0127)    Mobility non-ambulatory

## 2017-09-01 NOTE — H&P (Signed)
History and Physical    Alyssa Cameron ZOX:[id number] DOB: Jun 03, 1918 DOA: 08/31/2017  Referring MD/NP/PA: Rickey BarbaraSanders, PA-C PCP: Shirline FreesNafziger, Cory, NP  Patient coming from: Living Facility via EMS  Cardiologist Chilton Siiffany [place], MD  Chief Complaint: Fall  I have personally briefly reviewed patient's old medical records in [place] [facility]   HPI: Alyssa Cameron is a 84 y.o. female with medical history significant of HTN, diastolic CHF last EF 65 to 70% in 12/2014, A. fib on chronic anticoagulation of Eliquis, CKD stage IV, spinal stenosis, and frequent UTIs; who presents after having a fall while trying to pick up Kleenex.  At baseline patient uses a rolling walker to ambulate, but reports living mostly independently.  After the fall patient reported severe pain of the right leg by her knee.  Patient was unable to ambulate, bear weight, or move the leg without worsening pain.  She chronically has urinary frequency which she reports to chronic cystitis and intermittent leg swelling.  Denies any loss of consciousness, trauma to her head, chest pain, shortness of breath, cough, palpitations, significant change in weight, or diarrhea.  ED Course: Upon admission into the emergency department patient was seen to be afebrile, heart rates 60-113, blood pressures maintained, and O2 saturation 95 to 100% on room air.  Labs revealed WBC 6.7, hemoglobin 9.3, BUN 44, creatinine 1.53, and glucose 202.  X-rays revealed cardiomegaly with mild pulmonary congestion.  Patient was given morphine and Dr. Dion SaucierLandau of orthopedics was consulted and saw the patient while in the emergency department.  [facility] with possible surgical intervention in a.m.   Review of Systems  Constitutional: Negative for chills and fever.  HENT: Negative for ear discharge and nosebleeds.   Eyes: Negative for photophobia and pain.  Respiratory: Negative for cough and shortness of breath.   Cardiovascular: Positive for leg  swelling. Negative for chest pain and orthopnea.  Gastrointestinal: Positive for nausea (Following pain medication). Negative for abdominal pain, diarrhea and vomiting.  Genitourinary: Negative for dysuria and hematuria.  Musculoskeletal: Positive for falls, joint pain and myalgias.  Skin: Negative for itching and rash.  Neurological: Negative for focal weakness and loss of consciousness.  Psychiatric/Behavioral: Negative for substance abuse.    Past Medical History:  Diagnosis Date  . A-fib ([facility])   . Arthritis   . CHF (congestive heart failure) ([facility])   . Diverticulitis   . Frequent UTI   . History of colon polyps   . Hx of colonic polyps    2013 - 2 tubular adenoma sigmoid and Transverse polyp  . Hypertension   . Internal hemorrhoids   . Pulmonary hypertension ([facility]) 01/07/2015  . Spinal stenosis   . Urine incontinence     Past Surgical History:  Procedure Laterality Date  . APPENDECTOMY  1941  . BREAST BIOPSY  2010  . CATARACT EXTRACTION    . GALLBLADDER SURGERY  1970  . TONSILLECTOMY AND ADENOIDECTOMY  1941  . VAGINAL HYSTERECTOMY       reports that she has never smoked. She has never used smokeless tobacco. She reports that she does not drink alcohol or use drugs.  Allergies  Allergen Reactions  . Demerol [Meperidine] Nausea Only  . Vicodin [Hydrocodone-Acetaminophen] Nausea And Vomiting    Family History  Problem Relation Age of Onset  . Heart failure Mother        6464  . Heart failure Father        1975  . Breast cancer Unknown   .  Lung cancer Unknown   . Stroke Unknown     Prior to Admission medications   Medication Sig Start Date End Date Taking? Authorizing Provider  acetaminophen (TYLENOL) 500 MG tablet Take 500-1,000 mg by mouth every 8 (eight) hours as needed for moderate pain.     [provider]  apixaban (ELIQUIS) 2.5 MG TABS tablet Take 1 tablet (2.5 mg total) by mouth 2 (two) times daily. 03/08/17   Chilton Si, MD  CRANBERRY PO  Take 1 tablet by mouth daily.    [provider]  furosemide (LASIX) 20 MG tablet Take 1 tablet (20 mg total) by mouth daily. Take with the 40 mg tablet 08/23/17   Chilton Si, MD  furosemide (LASIX) 40 MG tablet Take 40 mg by mouth daily. Take with the 20 mg tablet 03/13/17   Chrystie Nose, MD  furosemide (LASIX) 40 MG tablet TAKE 1 TABLET BY MOUTH  DAILY 08/22/17   Chilton Si, MD  lisinopril (PRINIVIL,ZESTRIL) 20 MG tablet Take 1 tablet (20 mg total) by mouth daily. 03/01/17   Chilton Si, MD  metolazone (ZAROXOLYN) 2.5 MG tablet Take 1 tablet (2.5 mg total) by mouth once a week. Take weekly on tuesday 06/22/17 09/20/17  Jodelle Gross, NP  metoprolol succinate (TOPROL-XL) 100 MG 24 hr tablet TAKE 1 TABLET BY MOUTH  DAILY WITH BREAKFAST 06/12/17   Azalee Course, PA  ondansetron (ZOFRAN) 4 MG tablet Take 1 tablet (4 mg total) by mouth every 8 (eight) hours as needed for nausea or vomiting. 08/01/17   Nafziger, Kandee Keen, NP  pantoprazole (PROTONIX) 40 MG tablet TAKE 1 TABLET BY MOUTH  DAILY 06/13/17   Nafziger, Kandee Keen, NP  PARoxetine (PAXIL) 10 MG tablet Take 1 tablet (10 mg total) by mouth daily. 07/28/17   Nafziger, Kandee Keen, NP  potassium chloride SA (K-DUR,KLOR-CON) 20 MEQ tablet TAKE 1 TABLET BY MOUTH  EVERY DAY 01/11/17   Nafziger, Kandee Keen, NP  traZODone (DESYREL) 50 MG tablet TAKE 0.5-1 TABLETS (25-50 MG TOTAL) BY MOUTH AT BEDTIME AS NEEDED FOR SLEEP. 06/07/17   Shirline Frees, NP    Physical Exam:  Constitutional: Elderly female who appears to be in some moderate distress Vitals:   08/31/17 2335 08/31/17 2335 09/01/17 0000 09/01/17 0030  BP:  107/79 (!) 114/56 134/80  Pulse:  84 71 60  Resp:  18 15 12   Temp:      TempSrc:      SpO2: 99% 100% 97% 100%   Eyes: PERRL, lids and conjunctivae normal ENMT: Mucous membranes are dry. Posterior pharynx clear of any exudate or lesions.   Neck: normal, supple, no masses, no thyromegaly Respiratory: Some crackles noted of the mid to  lower lung fields bilaterally.  No significant wheezes or rhonchi appreciated. Normal respiratory effort. No accessory muscle use.  Cardiovascular: Irregularly irregular, no murmurs / rubs / gallops. No extremity edema. 2+ pedal pulses. No carotid bruits.  Abdomen: no tenderness, no masses palpated. No hepatosplenomegaly. Bowel sounds positive.  Musculoskeletal: Deformity noted of the right currently in immobilizer Skin: no rashes, lesions, ulcers. No induration Neurologic: CN 2-12 grossly intact. Sensation intact, DTR normal. Strength 5/5 in all 4.  Psychiatric: Normal judgment and insight. Alert and oriented x 3.  Anxious mood.     Labs on Admission: I have personally reviewed following labs and imaging studies  CBC: Recent Labs  Lab 08/31/17 2307  WBC 6.7  NEUTROABS 5.0  HGB 9.3*  HCT 30.8*  MCV 94.2  PLT 234   Basic  Metabolic Panel: Recent Labs  Lab 08/31/17 2307  NA 138  K 5.0  CL 104  CO2 24  GLUCOSE 202*  BUN 44*  CREATININE 1.53*  CALCIUM 9.4   GFR: CrCl cannot be calculated (Unknown ideal weight.). Liver Function Tests: No results for input(s): AST, ALT, ALKPHOS, BILITOT, PROT, ALBUMIN in the last 168 hours. No results for input(s): LIPASE, AMYLASE in the last 168 hours. No results for input(s): AMMONIA in the last 168 hours. Coagulation Profile: Recent Labs  Lab 08/31/17 2307  INR 1.46   Cardiac Enzymes: No results for input(s): CKTOTAL, CKMB, CKMBINDEX, TROPONINI in the last 168 hours. BNP (last 3 results) No results for input(s): PROBNP in the last 8760 hours. HbA1C: No results for input(s): HGBA1C in the last 72 hours. CBG: No results for input(s): GLUCAP in the last 168 hours. Lipid Profile: No results for input(s): CHOL, HDL, LDLCALC, TRIG, CHOLHDL, LDLDIRECT in the last 72 hours. Thyroid Function Tests: No results for input(s): TSH, T4TOTAL, FREET4, T3FREE, THYROIDAB in the last 72 hours. Anemia Panel: No results for input(s): VITAMINB12,  FOLATE, FERRITIN, TIBC, IRON, RETICCTPCT in the last 72 hours. Urine analysis:    Component Value Date/Time   COLORURINE RED (A) 03/09/2015 0836   APPEARANCEUR TURBID (A) 03/09/2015 0836   LABSPEC 1.014 03/09/2015 0836   PHURINE 5.5 03/09/2015 0836   GLUCOSEU NEGATIVE 03/09/2015 0836   HGBUR LARGE (A) 03/09/2015 0836   BILIRUBINUR NEGATIVE 03/09/2015 0836   BILIRUBINUR n 10/20/2014 1409   KETONESUR NEGATIVE 03/09/2015 0836   PROTEINUR 30 (A) 03/09/2015 0836   UROBILINOGEN 0.2 10/20/2014 1409   NITRITE NEGATIVE 03/09/2015 0836   LEUKOCYTESUR MODERATE (A) 03/09/2015 0836   Sepsis Labs: No results found for this or any previous visit (from the past 240 hour(s)).   Radiological Exams on Admission: Dg Chest 1 View  Result Date: 08/31/2017 CLINICAL DATA:  Initial preoperative evaluation. EXAM: CHEST  1 VIEW COMPARISON:  Prior radiograph from 07/28/2017. FINDINGS: Cardiomegaly, stable. Mediastinal silhouette within normal limits. Aortic atherosclerosis. Lungs hypoinflated. Diffuse pulmonary vascular congestion with interstitial prominence, suggesting mild pulmonary interstitial congestion/edema. Superimposed mild left basilar scarring, similar to previous. No focal infiltrates. No pleural effusion. No pneumothorax. No acute osseous abnormality. IMPRESSION: 1. Cardiomegaly with mild diffuse pulmonary interstitial congestion/edema. 2. Aortic atherosclerosis. Electronically Signed   By: Rise Mu M.D.   On: 08/31/2017 22:53   Dg Knee Complete 4 Views Right  Result Date: 08/31/2017 CLINICAL DATA:  [facility] at nursing home.  Obvious deformity. EXAM: RIGHT KNEE - COMPLETE 4+ VIEW COMPARISON:  None. FINDINGS: Acute comminuted impacted distal femur fracture with intra-articular extension through the trochlea. Posterior angulation distal bony fragments. No dislocation. No destructive bony lesions. Osteopenia. Soft tissue swelling and joint effusion. Mild vascular calcifications. IMPRESSION: Acute  displaced distal femur fracture.  No dislocation. Electronically Signed   By: Awilda Metro M.D.   On: 08/31/2017 22:42    EKG: Independently reviewed.  Atrial fibrillation at 68 bpm  Assessment/Plan Fall with displaced right distal femur fracture: Acute.  Patient reportedly had a mechanical fall resulting in acute displaced distal right femur fracture.  Dr. Dion Saucier  evaluated the patient in the emergency department here at [facility].  Utilizing the geriatric sensitive perioperative cardiac risk assessment the probability of perioperative myocardial infarction or cardiac arrest is 2.7% - Admit to a telemetry bed at [facility] - Utilized fracture order set - Continue leg immobilizer - Pain control - Appreciate Dr. Dion Saucier consultative services, will follow-up for further recommendations  Mild pulmonary edema, diastolic congestive heart failure: Patient noted to have some crackles on physical exam but no significant lower extremity edema.  Mild pulmonary edema on chest x-ray with crackles noted at the mid to lower lung fields.   Last echocardiogram performed back in 2016 showing EF of 65 to 70%.  Patient cardiologist is Dr. Chilton Si. - Focused heart failure order set initiated - Strict intake and output and daily weights - Check BNP - Lasix 20 mg IV given x1 dose, to decrease risks of patient becoming fluid overloaded during procedure - Reassess in a.m. to determine if needed further diuresis IV  - May want to consult patient's cardiologist in a.m.  Chronic atrial fibrillation on chronic anticoagulation: Patient currently rate controlled at 68 bpm.  Normally on anticoagulation of Eliquis. - Hold anticoagulation due to pending procedure  Anemia of chronic disease: Patient presents with hemoglobin of 9.3 on admission.  Baseline hemoglobin previously noted to be around 10. -Type and screen [facility] - Recheck CBC in a.m.  Chronic kidney disease stage IV: Stable.  Baseline  creatinine appears to be around 1.5. - Continue to monitor  Essential hypertension - Continue metoprolol  - restart lisinopril when medically appropriate  Hyperglycemia: Acute.  Initial glucose elevated at 202 on admission.  Suspect secondary to acute distress with fracture.  Patient with no previous history of diabetes. - Continue to monitor  - Consider need of placing patient on sliding scale insulin if blood sugars remain elevated  GERD - Continue Protonix  DVT prophylaxis: SCD Code Status: Full code.  Patient does not report DNR in place. Family Communication: No family present at bedside Disposition Plan:  TBD  Consults called: ortho  Admission status: inpatient  Clydie Braun MD [facility] Pager [phone]   If 7PM-7AM, please contact night-coverage [email or web address] Password [facility]  09/01/2017, 12:57 AM

## 2017-09-01 NOTE — Progress Notes (Signed)
Contacted patient's daughter Janace ArisCindy Sullivan POA at [phone] to inform her of her mother's transfer [facility].  Daughter will be arriving today around 2pm to see her and grand-daughter Sandrea HammondMeg will be here sometime this morning.

## 2017-09-01 NOTE — Anesthesia Procedure Notes (Signed)
Procedure Name: Intubation Date/Time: 09/01/2017 1:25 PM Performed by: Wilburn Cornelia, CRNA Pre-anesthesia Checklist: Patient identified, Emergency Drugs available, Suction available, Patient being monitored and Timeout performed Patient Re-evaluated:Patient Re-evaluated prior to induction Oxygen Delivery Method: Circle system utilized Preoxygenation: Pre-oxygenation with 100% oxygen Induction Type: IV induction Ventilation: Mask ventilation without difficulty Laryngoscope Size: Mac and 3 Grade View: Grade I Tube type: Oral Tube size: 7.0 mm Number of attempts: 1 Airway Equipment and Method: Stylet Placement Confirmation: ETT inserted through vocal cords under direct vision,  positive ETCO2,  CO2 detector and breath sounds checked- equal and bilateral Secured at: 21 cm Tube secured with: Tape Dental Injury: Teeth and Oropharynx as per pre-operative assessment

## 2017-09-01 NOTE — ED Notes (Signed)
Janace ArisCindy sullivan, daughter HPOA  [phone]

## 2017-09-01 NOTE — Op Note (Signed)
08/31/2017 - 09/01/2017  3:23 PM  PATIENT:  Alyssa Cameron  84 y.o. female  PRE-OPERATIVE DIAGNOSIS:  RIGHT SUPRACONDYLAR FEMUR FRACTURE WITH INTERCONDYLAR EXTENSION  POST-OPERATIVE DIAGNOSIS:  RIGHT SUPRACONDYLAR FEMUR FRACTURE WITH INTERCONDYLAR EXTENSION  PROCEDURE:  Procedure(s): OPEN REDUCTION INTERNAL FIXATION (ORIF) DISTAL FEMUR FRACTURE WITH INTERCONDYLAR EXTENSION (Right)   SURGEON:  Surgeon(s) and Role:    Myrene Galas* Krimson Massmann, MD - Primary  PHYSICIAN ASSISTANT: Montez MoritaKEITH PAUL, PA-C  ANESTHESIA:   general  EBL:  125 mL   BLOOD ADMINISTERED:none  DRAINS: none   LOCAL MEDICATIONS USED:  NONE  SPECIMEN:  No Specimen  DISPOSITION OF SPECIMEN:  N/A  COUNTS:  YES  TOURNIQUET:  * No tourniquets in log *  PLAN OF CARE: Admit to inpatient   PATIENT DISPOSITION:  PACU - hemodynamically stable.   Delay start of Pharmacological VTE agent (>24hrs) due to surgical blood loss or risk of bleeding: no  BRIEF SUMMARY OF INDICATION FOR PROCEDURE:  Alyssa Cameron is a pleasant 84 y.o. who sustained severe extremity trauma in ground level fall resulting in a comminuted supracondylar femur fracture with intercondylar extension.  I did discuss with both patient and family the risks and benefits of surgery including the possibility of infection, nerve injury, vessel injury, DVT/ PE, loss of motion, arthritis, symptomatic hardware, malunion, nonunion, and need for further surgery among others.  After full discussion, consent was given to proceed.  BRIEF SUMMARY OF PROCEDURE:  The patient was taken to the operating room where general anesthesia was induced.  The right lower extremity was prepped and draped in usual sterile fashion.  No tourniquet was used during the procedure.  After a time-out, use of towels and radiolucent triangle were used to obtain distraction in order to restore appropriate bone length and reduction of the fracture.  C-arm was brought in to mark the starting point on AP and  lateral images distally.  Incision was then made.  Dissection was carried carefully down to the retinaculum, which was incised and divided.  We were able to then visualize the fracture site which was gently cleaned of hematoma with curette and lavage.  By extending the incision anteriorly I was able to mobilize the vertical cortical fragment in the intercondylar split of the trochlea, then de-rotate and translate the fragments into a reduced position. While my assistant pulled traction, the medial end of the [facility] clamp was then inserted through a small medial incision and compressed to close down the fracture site anatomically which was pinned through the plate. We then worked with the shaft and articular block to appropriate alignment and translation on the AP and lateral views.  We were careful to watch for rotation throughout.  The 9-hole Biomet NCB plate was then advanced after achieving appropriate reduction through the distal incision laterally.  Once we were satisfied with position on AP and lateral images, a pin was placed distally and then a single screw proximally.  This was followed by additional K-wire fixation and additional screw fixation.  We then brought the knee into full extension and checked the rotation and alignment dialing this in.  This was followed by additional bicortical screw fixation proximally such that we ended with 4 bicortical screws and 5 screws into the supracondylar region distally.  I could visualize the articular surface and all screws were extra-articular.  Wounds were irrigated thoroughly.  C-arm was brought in to confirm appropriate reduction, hardware placement, trajectory and length.  All wounds were irrigated thoroughly and then closed in  standard layered fashion using #1 Vicryl, #0 Vicryl, 2-0 Vicryl, and 2-0 nylon.  A gently compressive dressing was applied from foot to thigh and then knee immobilizer.The patient was taken to PACU in stable  condition.  Montez Morita, PA-C, assisted me throughout and required to effectively produce, control, and maintain the reduction during provisional and definitive internal fixation. He also assisted with wound closure.  PROGNOSIS:  The magnitude of injury and joint involvement significantly increase the risks of loss of motion and arthritis. Patient will be nonweightbearing on the operative extremity with early mobilization encouraged. Pharmacologic DVT prophylaxis will be with Lovenox. Hinged brace will be used as an adjunct to assist with mobilization so long as it does not hinder motion. If the plate is prominent or symptomatic late removal could be considered after six to twelve months with adequate healing, but at 84 yo this is unlikely.    Doralee Albino. Carola Frost, M.D.

## 2017-09-02 ENCOUNTER — Inpatient Hospital Stay (HOSPITAL_COMMUNITY): Payer: Medicare Other

## 2017-09-02 LAB — BASIC METABOLIC PANEL
Anion gap: 8 (ref 5–15)
BUN: 43 mg/dL — AB (ref 6–20)
CALCIUM: 9.1 mg/dL (ref 8.9–10.3)
CHLORIDE: 105 mmol/L (ref 101–111)
CO2: 27 mmol/L (ref 22–32)
CREATININE: 1.68 mg/dL — AB (ref 0.44–1.00)
GFR calc Af Amer: 28 mL/min — ABNORMAL LOW (ref >60)
GFR calc non Af Amer: 25 mL/min — ABNORMAL LOW (ref >60)
Glucose, Bld: 171 mg/dL — ABNORMAL HIGH (ref 65–99)
Potassium: 4.6 mmol/L (ref 3.5–5.1)
SODIUM: 140 mmol/L (ref 135–145)

## 2017-09-02 LAB — CBC
HCT: 21.9 % — ABNORMAL LOW (ref 36.0–46.0)
Hemoglobin: 6.4 g/dL — CL (ref 12.0–15.0)
MCH: 27.9 pg (ref 26.0–34.0)
MCHC: 29.2 g/dL — ABNORMAL LOW (ref 30.0–36.0)
MCV: 95.6 fL (ref 78.0–100.0)
PLATELETS: 176 10*3/uL (ref 150–400)
RBC: 2.29 MIL/uL — ABNORMAL LOW (ref 3.87–5.11)
RDW: 17.9 % — AB (ref 11.5–15.5)
WBC: 9.3 10*3/uL (ref 4.0–10.5)

## 2017-09-02 LAB — PREPARE RBC (CROSSMATCH)

## 2017-09-02 LAB — HEMOGLOBIN AND HEMATOCRIT, BLOOD
HCT: 25.6 % — ABNORMAL LOW (ref 36.0–46.0)
Hemoglobin: 7.9 g/dL — ABNORMAL LOW (ref 12.0–15.0)

## 2017-09-02 LAB — MAGNESIUM: MAGNESIUM: 1.9 mg/dL (ref 1.7–2.4)

## 2017-09-02 MED ORDER — FUROSEMIDE 10 MG/ML IJ SOLN
20.0000 mg | Freq: Two times a day (BID) | INTRAMUSCULAR | Status: DC
  Administered 2017-09-02 (×2): 20 mg via INTRAVENOUS
  Filled 2017-09-02 (×2): qty 2

## 2017-09-02 MED ORDER — SODIUM CHLORIDE 0.9% IV SOLUTION
Freq: Once | INTRAVENOUS | Status: AC
  Administered 2017-09-02: 06:00:00 via INTRAVENOUS

## 2017-09-02 MED ORDER — CHLORHEXIDINE GLUCONATE CLOTH 2 % EX PADS
6.0000 | MEDICATED_PAD | Freq: Every day | CUTANEOUS | Status: DC
  Administered 2017-09-02 – 2017-09-06 (×4): 6 via TOPICAL

## 2017-09-02 MED ORDER — MUPIROCIN 2 % EX OINT
1.0000 "application " | TOPICAL_OINTMENT | Freq: Two times a day (BID) | CUTANEOUS | Status: DC
  Administered 2017-09-02 – 2017-09-06 (×9): 1 via NASAL
  Filled 2017-09-02: qty 22

## 2017-09-02 NOTE — NC FL2 (Signed)
[place] MEDICAID FL2 LEVEL OF CARE SCREENING TOOL     IDENTIFICATION  Patient Name: Alyssa Cameron Birthdate: 01-24-1919 Sex: female Admission Date (Current Location): 08/31/2017  [facility] and [place] Number:  Producer, television/film/video and Address:  [facility], [address] [place], [place] [address]      Provider Number: [id number]  Attending Physician Name and Address:  Delaine Lame, MD  Relative Name and Phone Number:  Shea Stakes, daughter, [phone]    Current Level of Care: Hospital Recommended Level of Care: Skilled Nursing Facility Prior Approval Number:    Date Approved/Denied:   PASRR Number:   [id number]   Discharge Plan: SNF    Current Diagnoses: Patient Active Problem List   Diagnosis Date Noted  . Femur fracture, right (HCC) 09/01/2017  . CKD (chronic kidney disease), stage IV (HCC) 09/01/2017  . Anemia of chronic disease 09/01/2017  . Falls, initial encounter 09/01/2017  . Urine incontinence   . Spinal stenosis   . Internal hemorrhoids   . Hypertension   . Hx of colonic polyps   . History of colon polyps   . Frequent UTI   . Diverticulitis   . CHF (congestive heart failure) (HCC)   . A-fib (HCC)   . Renal insufficiency 01/13/2017  . Pulmonary hypertension (HCC) 01/07/2015  . Chronic atrial fibrillation (HCC) 10/20/2014  . Acute on chronic right heart failure (HCC) 10/20/2014  . Essential hypertension 10/20/2014  . Arthritis 10/20/2014  . Chronic UTI 10/20/2014    Orientation RESPIRATION BLADDER Height & Weight     Self, Time, Situation, Place  Normal, O2(intermittant nasal canula 2L) Incontinent, Indwelling catheter Weight: 181 lb 14.1 oz (82.5 kg) Height:     BEHAVIORAL SYMPTOMS/MOOD NEUROLOGICAL BOWEL NUTRITION STATUS      Continent Diet(see discharge summary)  AMBULATORY STATUS COMMUNICATION OF NEEDS Skin   Extensive Assist Verbally Surgical wounds(incision on right leg with adhesive strips)                       Personal Care Assistance Level of Assistance  Bathing, Feeding, Dressing Bathing Assistance: Maximum assistance Feeding assistance: Independent Dressing Assistance: Maximum assistance     Functional Limitations Info  Sight, Hearing, Speech Sight Info: Impaired Hearing Info: Impaired Speech Info: Adequate    SPECIAL CARE FACTORS FREQUENCY  OT (By licensed OT), PT (By licensed PT)     PT Frequency: 5x week OT Frequency: 5x week            Contractures Contractures Info: Not present    Additional Factors Info  Code Status, Allergies Code Status Info: Full Code Allergies Info: DEMEROL MEPERIDINE, VICODIN HYDROCODONE-ACETAMINOPHEN            Current Medications (09/02/2017):  This is the current hospital active medication list Current Facility-Administered Medications  Medication Dose Route Frequency Provider Last Rate Last Dose  . Chlorhexidine Gluconate Cloth 2 % PADS 6 each  6 each Topical Daily Purohit, Salli Quarry, MD   6 each at 09/02/17 0851  . docusate sodium (COLACE) capsule 100 mg  100 mg Oral BID Montez Morita, PA-C   100 mg at 09/02/17 0849  . enoxaparin (LOVENOX) injection 30 mg  30 mg Subcutaneous Q24H Norva Pavlov, RPH   30 mg at 09/02/17 0851  . furosemide (LASIX) injection 20 mg  20 mg Intravenous BID Purohit, Shrey C, MD   20 mg at 09/02/17 1021  . HYDROcodone-acetaminophen (NORCO/VICODIN) 5-325 MG per tablet  1-2 tablet  1-2 tablet Oral Q6H PRN Montez Morita, PA-C   1 tablet at 09/02/17 1350  . lactated ringers infusion   Intravenous Continuous Montez Morita, PA-C   Stopped at 09/02/17 1514  . methocarbamol (ROBAXIN) tablet 500 mg  500 mg Oral Q6H PRN Montez Morita, PA-C   500 mg at 09/02/17 1350   Or  . methocarbamol (ROBAXIN) 500 mg in dextrose 5 % 50 mL IVPB  500 mg Intravenous Q6H PRN Montez Morita, PA-C      . metoCLOPramide (REGLAN) tablet 5-10 mg  5-10 mg Oral Q8H PRN Montez Morita, PA-C       Or  . metoCLOPramide (REGLAN) injection 5-10  mg  5-10 mg Intravenous Q8H PRN Montez Morita, PA-C      . [START ON 09/05/2017] metolazone (ZAROXOLYN) tablet 2.5 mg  2.5 mg Oral Weekly Montez Morita, PA-C      . metoprolol succinate (TOPROL-XL) 24 hr tablet 100 mg  100 mg Oral Q breakfast Montez Morita, PA-C   100 mg at 09/02/17 0905  . morphine 2 MG/ML injection 0.5 mg  0.5 mg Intravenous Q2H PRN Montez Morita, PA-C   0.5 mg at 09/02/17 0846  . mupirocin ointment (BACTROBAN) 2 % 1 application  1 application Nasal BID Purohit, Salli Quarry, MD   1 application at 09/02/17 0853  . ondansetron (ZOFRAN) tablet 4 mg  4 mg Oral Q6H PRN Montez Morita, PA-C       Or  . ondansetron [facility]) injection 4 mg  4 mg Intravenous Q6H PRN Montez Morita, PA-C      . pantoprazole (PROTONIX) EC tablet 40 mg  40 mg Oral Daily Montez Morita, PA-C   40 mg at 09/02/17 0849  . prochlorperazine (COMPAZINE) injection 10 mg  10 mg Intravenous Q6H PRN Montez Morita, PA-C   10 mg at 09/01/17 0810  . senna (SENOKOT) tablet 8.6 mg  1 tablet Oral Daily Montez Morita, PA-C   8.6 mg at 09/02/17 0849  . traZODone (DESYREL) tablet 25-50 mg  25-50 mg Oral QHS PRN Montez Morita, PA-C       Facility-Administered Medications Ordered in Other Encounters  Medication Dose Route Frequency Provider Last Rate Last Dose  . bupivacaine-epinephrine (MARCAINE W/ EPI) 0.5% -1:200000 injection    Anesthesia Intra-op Val Eagle, MD   25 mL at 09/01/17 1326  . dexamethasone (DECADRON) injection   Intravenous Anesthesia Intra-op Rachel Moulds, CRNA   4 mg at 09/01/17 1328  . fentaNYL (SUBLIMAZE) injection    Anesthesia Intra-op Rachel Moulds, CRNA   50 mcg at 09/01/17 1352  . ondansetron (ZOFRAN) injection   Intravenous Anesthesia Intra-op Rachel Moulds, CRNA   4 mg at 09/01/17 1516  . phenylephrine (NEO-SYNEPHRINE) 0.04 mg/mL in dextrose 5 % 250 mL infusion    Continuous PRN Rachel Moulds, CRNA   Stopped at 09/01/17 1531  . PHENYLephrine 40 mcg/ml in normal saline Adult IV Push Syringe   Intravenous Anesthesia  Intra-op Rachel Moulds, CRNA   80 mcg at 09/01/17 1334  . propofol (DIPRIVAN) 10 mg/mL bolus/IV push    Anesthesia Intra-op Rachel Moulds, CRNA   20 mg at 09/01/17 1333  . rocuronium bromide 10 mg/mL (PF) syringe   Intravenous Anesthesia Intra-op Rachel Moulds, CRNA   10 mg at 09/01/17 1412  . succinylcholine (ANECTINE) syringe   Intravenous Anesthesia Intra-op Rachel Moulds, CRNA   60 mg at 09/01/17 1321     Discharge Medications: Please see discharge  summary for a list of discharge medications.  Relevant Imaging Results:  Relevant Lab Results:   Additional Information SS# [id number]  Talia Hoheisel H [place], [place]

## 2017-09-02 NOTE — Progress Notes (Signed)
PROGRESS NOTE    Alyssa Cameron  ZOX:[id number] DOB: 1918-05-28 DOA: 08/31/2017 PCP: Shirline Frees, NP    Brief Narrative:  84 year old with past medical history relevant for hypertension, diastolic heart failure with EF of 65 to 70% on 12/2014, stage IV CKD, anemia due to CKD, atrial fibrillation on apixaban, stage IV chronic kidney disease who presented to the emergency department at [facility] after a fall and found to have a distal femur fracture on the right.  Patient was transferred to [facility] for surgical repair.   Assessment & Plan:   Principal Problem:   Femur fracture, right (HCC) Active Problems:   Chronic atrial fibrillation (HCC)   Essential hypertension   CHF (congestive heart failure) (HCC)   CKD (chronic kidney disease), stage IV (HCC)   Anemia of chronic disease   Falls, initial encounter   #) Mechanical fall complicated by right distal femur fracture status post open reduction internal fixation on 09/01/2017:  -orthopedic consult, appreciate recommendations -Physical therapy afterwards, likely discharge to skilled nursing facility  #) Acute blood loss anemia: Likely acute blood loss on top of anemia of chronic disease/anemia due to CKD. -2 units packed red blood cells -IV furosemide  #) Chronic atrial fibrillation: - Apixaban continue to hold as patient is quite anemic -Continue metoprolol succinate 100 mg daily  #) Chronic diastolic heart failure: Currently patient appears to be euvolemic - Continue metolazone 2.5 mg every Tuesday - Hold home furosemide 40 mg daily -Start IV furosemide  #) Pain/psych: - Continue paroxetine 10 mg daily  #) CKD stage IV complicated by hypoproliferative anemia: Stable   Fluids: Restrict Elect lites: Monitor and supplement Nutrition:  heart healthy diet  Prophylaxis: Enoxaparin  Disposition: Pending evaluation by PT and discharge to skilled nursing facility  Full code  Consultants:   Orthopedic  surgery  Procedures:   None  Antimicrobials:   Perioperative antibiotics   Subjective: Patient reports significant pain in her right leg.  She otherwise does not have any chest pain, shortness of breath, nausea, vomiting, diarrhea.  Objective: Vitals:   09/02/17 0630 09/02/17 0857 09/02/17 0909 09/02/17 1053  BP: (!) 103/56 (!) 128/91 (!) 93/54 (!) 100/52  Pulse: 96 91 (!) 121 85  Resp: 14 15 15 15   Temp: 98.1 F (36.7 C) 97.8 F (36.6 C) 97.8 F (36.6 C) 98 F (36.7 C)  TempSrc: Oral Oral Oral Oral  SpO2: 100% 100% 100% 100%  Weight:        Intake/Output Summary (Last 24 hours) at 09/02/2017 1118 Last data filed at 09/02/2017 1110 Gross per 24 hour  Intake 82.1 ml  Output 1225 ml  Net -1142.9 ml   Filed Weights   09/01/17 1900 09/02/17 0500  Weight: 82.5 kg (181 lb 14.1 oz) 82.5 kg (181 lb 14.1 oz)    Examination:  General exam: No acute distress Respiratory system: Mildly increased work of breathing, bibasilar crackles, scattered rhonchi, no wheezes Cardiovascular system: Distant heart sounds, irregularly regular, no murmurs Gastrointestinal system: Abdomen is nondistended, soft and nontender. No organomegaly or masses felt. Normal bowel sounds heard. Central nervous system: Alert and oriented. No focal neurological deficits. Extremities: Right lower extremity wrapped, distal perfusion intact Skin: Entire right lower extremity is wrapped Psychiatry: Judgement and insight appear normal. Mood & affect appropriate.     Data Reviewed: I have personally reviewed following labs and imaging studies  CBC: Recent Labs  Lab 08/31/17 2307 09/01/17 0830 09/02/17 0347  WBC 6.7 10.2 9.3  NEUTROABS 5.0  --   --  HGB 9.3* 7.9* 6.4*  HCT 30.8* 27.0* 21.9*  MCV 94.2 96.1 95.6  PLT 234 201 176   Basic Metabolic Panel: Recent Labs  Lab 08/31/17 2307 09/01/17 0830 09/02/17 0347  NA 138  --  140  K 5.0  --  4.6  CL 104  --  105  CO2 24  --  27  GLUCOSE 202*   --  171*  BUN 44*  --  43*  CREATININE 1.53* 1.58* 1.68*  CALCIUM 9.4  --  9.1  MG  --   --  1.9   GFR: Estimated Creatinine Clearance: 19.5 mL/min (A) (by C-G formula based on SCr of 1.68 mg/dL (H)). Liver Function Tests: No results for input(s): AST, ALT, ALKPHOS, BILITOT, PROT, ALBUMIN in the last 168 hours. No results for input(s): LIPASE, AMYLASE in the last 168 hours. No results for input(s): AMMONIA in the last 168 hours. Coagulation Profile: Recent Labs  Lab 08/31/17 2307  INR 1.46   Cardiac Enzymes: No results for input(s): CKTOTAL, CKMB, CKMBINDEX, TROPONINI in the last 168 hours. BNP (last 3 results) No results for input(s): PROBNP in the last 8760 hours. HbA1C: No results for input(s): HGBA1C in the last 72 hours. CBG: No results for input(s): GLUCAP in the last 168 hours. Lipid Profile: No results for input(s): CHOL, HDL, LDLCALC, TRIG, CHOLHDL, LDLDIRECT in the last 72 hours. Thyroid Function Tests: No results for input(s): TSH, T4TOTAL, FREET4, T3FREE, THYROIDAB in the last 72 hours. Anemia Panel: No results for input(s): VITAMINB12, FOLATE, FERRITIN, TIBC, IRON, RETICCTPCT in the last 72 hours. Sepsis Labs: No results for input(s): PROCALCITON, LATICACIDVEN in the last 168 hours.  Recent Results (from the past 240 hour(s))  Surgical pcr screen     Status: Abnormal   Collection Time: 09/01/17  3:59 AM  Result Value Ref Range Status   MRSA, PCR NEGATIVE NEGATIVE Final   Staphylococcus aureus POSITIVE (A) NEGATIVE Final    Comment: (NOTE) The Xpert SA Assay (FDA approved for NASAL specimens in patients 82 years of age and older), is one component of a comprehensive surveillance program. It is not intended to diagnose infection nor to guide or monitor treatment. Performed at [facility], [address] [place], [place] [address]          Radiology Studies: Dg Chest 1 View  Result Date: 08/31/2017 CLINICAL DATA:  Initial preoperative  evaluation. EXAM: CHEST  1 VIEW COMPARISON:  Prior radiograph from 07/28/2017. FINDINGS: Cardiomegaly, stable. Mediastinal silhouette within normal limits. Aortic atherosclerosis. Lungs hypoinflated. Diffuse pulmonary vascular congestion with interstitial prominence, suggesting mild pulmonary interstitial congestion/edema. Superimposed mild left basilar scarring, similar to previous. No focal infiltrates. No pleural effusion. No pneumothorax. No acute osseous abnormality. IMPRESSION: 1. Cardiomegaly with mild diffuse pulmonary interstitial congestion/edema. 2. Aortic atherosclerosis. Electronically Signed   By: Rise MuBenjamin  McClintock M.D.   On: 08/31/2017 22:53   Ct Knee Right Wo Contrast  Result Date: 09/01/2017 CLINICAL DATA:  Patient stumbled and fell. Right knee fracture seen on radiographs. EXAM: CT OF THE right KNEE WITHOUT CONTRAST TECHNIQUE: Multidetector CT imaging of the right knee was performed according to the standard protocol. Multiplanar CT image reconstructions were also generated. COMPARISON:  Right knee radiograph 08/31/2017 FINDINGS: Bones/Joint/Cartilage Markedly comminuted fractures of the distal right radial metaphysis with extension to the trochlea. Fracture lines extend also to the patella femoral articular surface. There is impaction of the fracture fragments with posterior angulation of the major distal fragments and displacement of multiple cortical  fragments. Mild lateral subluxation of the tibia with respect to the femoral heads. Proximal tibia and fibula appear intact. Underlying chronic degenerative changes with medial and lateral compartment narrowing and 3 compartment osteophyte formation. Chondrocalcinosis. Moderate-sized joint effusion. Ligaments Suboptimally assessed by CT. Muscles and Tendons Fatty atrophy of the musculature. No intramuscular hematoma identified. Soft tissues Diffuse soft tissue edema throughout the subcutaneous fatty tissues. Vascular calcifications in the  popliteal artery and tibial trunk. IMPRESSION: Markedly comminuted fractures of the distal right radial metaphysis extending to the trochlea and patella femoral articular surface. Impaction of fracture fragments with posterior angulation of the major distal fragments and displacement of multiple cortical fragments. Mild lateral subluxation of the tibia with respect to the femoral head fragments. Moderate-sized effusion and diffuse edema. Chronic tricompartment degenerative changes in the knee. Electronically Signed   By: Burman Nieves M.D.   On: 09/01/2017 01:00   Dg Chest Port 1 View  Result Date: 09/02/2017 CLINICAL DATA:  Shortness of breath EXAM: PORTABLE CHEST 1 VIEW COMPARISON:  08/31/2017 FINDINGS: Mild cardiac enlargement. Improved vascular congestion and interstitial edema since previous study. Linear atelectasis or fibrosis in the left mid lung. No blunting of costophrenic angles. No pneumothorax. Calcification of the aorta. Degenerative changes in the spine and shoulders. IMPRESSION: Cardiac enlargement with improved vascular congestion and edema since previous study. Electronically Signed   By: Burman Nieves M.D.   On: 09/02/2017 05:21   Dg Knee Complete 4 Views Right  Result Date: 08/31/2017 CLINICAL DATA:  [facility] Seat at nursing home.  Obvious deformity. EXAM: RIGHT KNEE - COMPLETE 4+ VIEW COMPARISON:  None. FINDINGS: Acute comminuted impacted distal femur fracture with intra-articular extension through the trochlea. Posterior angulation distal bony fragments. No dislocation. No destructive bony lesions. Osteopenia. Soft tissue swelling and joint effusion. Mild vascular calcifications. IMPRESSION: Acute displaced distal femur fracture.  No dislocation. Electronically Signed   By: Awilda Metro M.D.   On: 08/31/2017 22:42   Dg C-arm 1-60 Min  Result Date: 09/01/2017 CLINICAL DATA:  (Orif) Right distal Femur Fracture. Fluoro time: 48 sec EXAM: RIGHT FEMUR 2 VIEWS; DG C-ARM 61-120 MIN  COMPARISON:  08/31/2017 FINDINGS: The images demonstrate ORIF of the comminuted fracture the distal RIGHT femur with a LATERAL screw plate. Alignment is near anatomic. No interval fractures identified. IMPRESSION: ORIF of comminuted RIGHT femur fracture. Electronically Signed   By: Norva Pavlov M.D.   On: 09/01/2017 15:24   Dg C-arm 1-60 Min  Result Date: 09/01/2017 CLINICAL DATA:  (Orif) Right distal Femur Fracture. Fluoro time: 48 sec EXAM: RIGHT FEMUR 2 VIEWS; DG C-ARM 61-120 MIN COMPARISON:  08/31/2017 FINDINGS: The images demonstrate ORIF of the comminuted fracture the distal RIGHT femur with a LATERAL screw plate. Alignment is near anatomic. No interval fractures identified. IMPRESSION: ORIF of comminuted RIGHT femur fracture. Electronically Signed   By: Norva Pavlov M.D.   On: 09/01/2017 15:24   Dg Femur, Min 2 Views Right  Result Date: 09/01/2017 CLINICAL DATA:  (Orif) Right distal Femur Fracture. Fluoro time: 48 sec EXAM: RIGHT FEMUR 2 VIEWS; DG C-ARM 61-120 MIN COMPARISON:  08/31/2017 FINDINGS: The images demonstrate ORIF of the comminuted fracture the distal RIGHT femur with a LATERAL screw plate. Alignment is near anatomic. No interval fractures identified. IMPRESSION: ORIF of comminuted RIGHT femur fracture. Electronically Signed   By: Norva Pavlov M.D.   On: 09/01/2017 15:24   Dg Femur Port, Min 2 Views Right  Result Date: 09/01/2017 CLINICAL DATA:  Right femur fracture post ORIF. EXAM:  RIGHT FEMUR PORTABLE 2 VIEW COMPARISON:  Radiographs 08/31/2017. Intraoperative radiographs earlier today. FINDINGS: Status post lateral plate and screw fixation of the comminuted intra-articular fracture of the distal femur. Compared with the preoperative radiographs, there is improved alignment of the fracture with residual displacement at the metaphysis by 7 mm medially on the AP view and 14 mm posteriorly on the lateral view. No significant residual displacement of the intra-articular  portion. Underlying tricompartmental degenerative changes are present at the knee. IMPRESSION: Improved alignment of the comminuted intra-articular fracture of the distal femur post ORIF. No demonstrated complication. Electronically Signed   By: Carey Bullocks M.D.   On: 09/01/2017 17:14        Scheduled Meds: . Chlorhexidine Gluconate Cloth  6 each Topical Daily  . docusate sodium  100 mg Oral BID  . enoxaparin (LOVENOX) injection  30 mg Subcutaneous Q24H  . furosemide  20 mg Intravenous BID  . [START ON 09/05/2017] metolazone  2.5 mg Oral Weekly  . metoprolol succinate  100 mg Oral Q breakfast  . mupirocin ointment  1 application Nasal BID  . pantoprazole  40 mg Oral Daily  . senna  1 tablet Oral Daily   Continuous Infusions: .  ceFAZolin (ANCEF) IV    . lactated ringers Stopped (09/01/17 1845)  . methocarbamol (ROBAXIN)  IV       LOS: 1 day    Time spent: 35    Delaine Lame, MD [facility]   If 7PM-7AM, please contact night-coverage [email or web address] Password [facility] 09/02/2017, 11:18 AM

## 2017-09-02 NOTE — Progress Notes (Signed)
CRITICAL VALUE ALERT  Critical Value:  hgb 6.4  Date & Time Notied:  6/22 04:48  Provider Notified: [facility] Stevie Kernharles Bodenheimer  Orders Received/Actions taken: Transfuse 2 units of blood, STAT chest x-ray, blood gas arterial.

## 2017-09-02 NOTE — Evaluation (Signed)
Physical Therapy Evaluation Patient Details Name: Alyssa Cameron MRN: [id number] DOB: 1918-11-29 Today's Date: 09/02/2017   History of Present Illness  Pt is a 84 y/o female admitted following a fall at home, sustaining a R distal femur fracture. Pt is a NWB R LE with wound VAC application. PMH including but not limited to a-fib, CHF and HTN.    Clinical Impression  Pt presented supine in bed with HOB elevated, awake and willing to participate in therapy session. Prior to admission, pt reported that she ambulated with use of RW and was independent with ADLs. [facility] Pt currently requires max A x2 for bed mobility and max A x2 for transfers. Pt would continue to benefit from skilled physical therapy services at this time while admitted and after d/c to address the below listed limitations in order to improve overall safety and independence with functional mobility.     Follow Up Recommendations SNF    Equipment Recommendations  None recommended by PT    Recommendations for Other Services       Precautions / Restrictions Precautions Precautions: Fall Restrictions Weight Bearing Restrictions: Yes RLE Weight Bearing: Non weight bearing      Mobility  Bed Mobility Overal bed mobility: Needs Assistance Bed Mobility: Supine to Sit     Supine to sit: Max assist;+2 for physical assistance     General bed mobility comments: increased time and effort, heavy physical assistance to move bilateral LEs off of bed and for trunk elevation  Transfers Overall transfer level: Needs assistance Equipment used: Rolling walker (2 wheeled) Transfers: Sit to/from BJ'sStand;Stand Pivot Transfers Sit to Stand: From elevated surface;+2 physical assistance;Max assist Stand pivot transfers: +2 physical assistance;Max assist       General transfer comment: increased time and effort, cueing for technique and sequencing, assist to hold R LE off of floor to  maintain NWB R  LE  Ambulation/Gait                Stairs            Wheelchair Mobility    Modified Rankin (Stroke Patients Only)       Balance Overall balance assessment: Needs assistance;History of Falls Sitting-balance support: Bilateral upper extremity supported Sitting balance-Leahy Scale: Poor     Standing balance support: During functional activity;Bilateral upper extremity supported Standing balance-Leahy Scale: Poor                               Pertinent Vitals/Pain Pain Assessment: Faces Faces Pain Scale: Hurts whole lot Pain Location: R hip Pain Descriptors / Indicators: Sore;Grimacing;Guarding Pain Intervention(s): Monitored during session;Repositioned    Home Living Family/patient expects to be discharged to:: Other (Comment)                 Additional Comments: Independent living facility; level entry, handicap accessible, walk-in shower with a shower seat    Prior Function Level of Independence: Independent with assistive device(s)         Comments: pt ambulates with RW     Hand Dominance        Extremity/Trunk Assessment   Upper Extremity Assessment Upper Extremity Assessment: Defer to OT evaluation    Lower Extremity Assessment Lower Extremity Assessment: Generalized weakness;RLE deficits/detail RLE Deficits / Details: pt with decreased strength and ROM limitations secondary to post-op pain and weakness. RLE: Unable to fully assess due to pain  Cervical / Trunk Assessment Cervical / Trunk Assessment: Kyphotic  Communication   Communication: HOH  Cognition Arousal/Alertness: Awake/alert Behavior During Therapy: WFL for tasks assessed/performed Overall Cognitive Status: Impaired/Different from baseline Area of Impairment: Memory;Following commands;Safety/judgement;Problem solving                     Memory: Decreased recall of precautions;Decreased short-term memory Following Commands: Follows one step  commands inconsistently;Follows one step commands with increased time Safety/Judgement: Decreased awareness of safety;Decreased awareness of deficits   Problem Solving: Slow processing;Decreased initiation;Difficulty sequencing;Requires verbal cues;Requires tactile cues        General Comments      Exercises     Assessment/Plan    PT Assessment Patient needs continued PT services  PT Problem List Decreased strength;Decreased range of motion;Decreased activity tolerance;Decreased balance;Decreased mobility;Decreased coordination;Decreased cognition;Decreased safety awareness;Decreased knowledge of use of DME;Decreased knowledge of precautions;Pain       PT Treatment Interventions Gait training;DME instruction;Stair training;Functional mobility training;Therapeutic activities;Therapeutic exercise;Balance training;Neuromuscular re-education;Patient/family education    PT Goals (Current goals can be found in the Care Plan section)  Acute Rehab PT Goals Patient Stated Goal: decrease pain, return to PLOF PT Goal Formulation: With patient/family Time For Goal Achievement: 09/16/17 Potential to Achieve Goals: Fair    Frequency Min 3X/week   Barriers to discharge        Co-evaluation PT/OT/SLP Co-Evaluation/Treatment: Yes Reason for Co-Treatment: For patient/therapist safety;To address functional/ADL transfers PT goals addressed during session: Mobility/safety with mobility;Balance;Proper use of DME;Strengthening/ROM         AM-PAC PT "6 Clicks" Daily Activity  Outcome Measure Difficulty turning over in bed (including adjusting bedclothes, sheets and blankets)?: Unable Difficulty moving from lying on back to sitting on the side of the bed? : Unable Difficulty sitting down on and standing up from a chair with arms (e.g., wheelchair, bedside commode, etc,.)?: Unable Help needed moving to and from a bed to chair (including a wheelchair)?: A Lot Help needed walking in hospital  room?: Total Help needed climbing 3-5 steps with a railing? : Total 6 Click Score: 7    End of Session Equipment Utilized During Treatment: Gait belt Activity Tolerance: Patient limited by pain;Patient limited by fatigue Patient left: in chair;with call bell/phone within reach;with family/visitor present Nurse Communication: Mobility status PT Visit Diagnosis: Other abnormalities of gait and mobility (R26.89);Pain Pain - Right/Left: Right Pain - part of body: Leg    Time: 1610-9604 PT Time Calculation (min) (ACUTE ONLY): 44 min   Charges:   PT Evaluation $PT Eval Moderate Complexity: 1 Mod PT Treatments $Therapeutic Activity: 8-22 mins   PT G Codes:        [place], PT, DPT [phone]   Alyssa Cameron 09/02/2017, 5:02 PM

## 2017-09-02 NOTE — Progress Notes (Signed)
Subjective: 1 Day Post-Op Procedure(s) (LRB): OPEN REDUCTION INTERNAL FIXATION (ORIF) DISTAL FEMUR FRACTURE (Right) Patient reports pain as moderate.   Denies nausea,vomiting  Objective: Vital signs in last 24 hours: Temp:  [97 F (36.1 C)-98.2 F (36.8 C)] 97.8 F (36.6 C) (06/22 0909) Pulse Rate:  [52-121] 121 (06/22 0909) Resp:  [9-15] 15 (06/22 0909) BP: (90-128)/(41-91) 93/54 (06/22 0909) SpO2:  [93 %-100 %] 100 % (06/22 0909) Weight:  [82.5 kg (181 lb 14.1 oz)] 82.5 kg (181 lb 14.1 oz) (06/22 0500)  Intake/Output from previous day: 06/21 0701 - 06/22 0700 In: 82.1 [I.V.:58.9; IV Piggyback:23.2] Out: 925 [Urine:800; Blood:125] Intake/Output this shift: No intake/output data recorded.  Recent Labs    08/31/17 2307 09/01/17 0830 09/02/17 0347  HGB 9.3* 7.9* 6.4*   Recent Labs    09/01/17 0830 09/02/17 0347  WBC 10.2 9.3  RBC 2.81* 2.29*  HCT 27.0* 21.9*  PLT 201 176   Recent Labs    08/31/17 2307 09/01/17 0830 09/02/17 0347  NA 138  --  140  K 5.0  --  4.6  CL 104  --  105  CO2 24  --  27  BUN 44*  --  43*  CREATININE 1.53* 1.58* 1.68*  GLUCOSE 202*  --  171*  CALCIUM 9.4  --  9.1   Recent Labs    08/31/17 2307  INR 1.46    Neurovascular intact Sensation intact distally Intact pulses distally  Anticipated LOS equal to or greater than 2 midnights due to - Age 82 and older with one or more of the following:  - Obesity  - Expected need for hospital services (PT, OT, Nursing) required for safe  discharge  - Anticipated need for postoperative skilled nursing care or inpatient rehab  - Active co-morbidities: Coronary Artery Disease OR   - Unanticipated findings during/Post Surgery: None  - Patient is a high risk of re-admission due to: None  Principal Problem:   Femur fracture, right (HCC) Active Problems:   Chronic atrial fibrillation (HCC)   Essential hypertension   CHF (congestive heart failure) (HCC)   CKD (chronic kidney disease),  stage IV (HCC)   Anemia of chronic disease   Falls, initial encounter  Assessment/plan  1 Day Post-Op Procedure(s) (LRB): OPEN REDUCTION INTERNAL FIXATION (ORIF) DISTAL FEMUR FRACTURE (Right) Advance diet Up with therapy Discharge to SNF Post hemorrhagic anemia-receiving 2 units prbcs now Continue VAC dressing until monday  BLAIR ROBERTS 09/02/2017, 10:45 AM  [phone]

## 2017-09-02 NOTE — Social Work (Signed)
CSW acknowledging consult for SNF placement, aware [facility]. Await PT/OT notes.   Doy HutchingIsabel H Jalila Goodnough, LCSWA [facility] Clinical Social Work [phone]

## 2017-09-02 NOTE — Social Work (Signed)
CSW awaiting PT/OT assessments, [facility]  [employer] Medicare closed on weekend, would be able to discharge Monday if evaluations complete.   Doy HutchingIsabel H Bethene Hankinson, LCSWA [facility] Clinical Social Work [phone]

## 2017-09-02 NOTE — Plan of Care (Signed)
  Problem: Pain Management: Goal: Pain level will decrease Outcome: Progressing   Problem: Nutrition: Goal: Adequate nutrition will be maintained Outcome: Progressing   Problem: Elimination: Goal: Will not experience complications related to bowel motility Outcome: Progressing

## 2017-09-02 NOTE — Evaluation (Signed)
Occupational Therapy Evaluation Patient Details Name: Alyssa Cameron MRN: [id number] DOB: October 31, 1918 Today's Date: 09/02/2017    History of Present Illness Pt is a 84 y/o female admitted following a fall at home, sustaining a R distal femur fracture. Pt is a NWB R LE with wound VAC application. PMH including but not limited to a-fib, CHF and HTN.   Clinical Impression   PTA, pt was living in independent living facility and was independent with RW for basic ADL and functional mobility. The facility provides meals. Pt currently limited by RLE pain and difficulty maintaining NWB RLE despite physical assistance from OT to lift RLE from ground during simulated toilet transfers. She requires max assist +2 for simulated stand-pivot toilet transfer, total assist for LB ADL, and min guard assist for seated UB ADL at EOB. Pt would benefit from continued OT services while admitted to improve independence and safety with ADL and functional mobility. Recommend SNF level rehabilitation post-acute D/C to maximize functional participation in ADL tasks. Will continue to follow while admitted.     Follow Up Recommendations  SNF;Supervision/Assistance - 24 hour    Equipment Recommendations  Other (comment)(defer to next venue of care)    Recommendations for Other Services       Precautions / Restrictions Precautions Precautions: Fall Restrictions Weight Bearing Restrictions: Yes RLE Weight Bearing: Non weight bearing      Mobility Bed Mobility Overal bed mobility: Needs Assistance Bed Mobility: Supine to Sit     Supine to sit: Max assist;+2 for physical assistance     General bed mobility comments: increased time and effort, heavy physical assistance to move bilateral LEs off of bed and for trunk elevation  Transfers Overall transfer level: Needs assistance Equipment used: Rolling walker (2 wheeled) Transfers: Sit to/from [employer] Sit to Stand: From elevated surface;+2  physical assistance;Max assist Stand pivot transfers: +2 physical assistance;Max assist       General transfer comment: increased time and effort, cueing for technique and sequencing, assist to hold R LE off of floor to  maintain NWB R LE but pt with difficulty maintaining this    Balance Overall balance assessment: Needs assistance;History of Falls Sitting-balance support: Bilateral upper extremity supported Sitting balance-Leahy Scale: Poor     Standing balance support: During functional activity;Bilateral upper extremity supported Standing balance-Leahy Scale: Poor Standing balance comment: Relies on external support.                            ADL either performed or assessed with clinical judgement   ADL Overall ADL's : Needs assistance/impaired Eating/Feeding: Set up;Sitting   Grooming: Supervision/safety;Sitting   Upper Body Bathing: Min guard;Sitting   Lower Body Bathing: Total assistance;Sit to/from stand   Upper Body Dressing : Min guard;Sitting   Lower Body Dressing: Total assistance;Sit to/from stand   Toilet Transfer: Maximal assistance;+2 for physical assistance;Stand-pivot;RW Toilet Transfer Details (indicate cue type and reason): Simualted from bed to chair. OT supporting RLE with foot to prevent weight bearing although pt with difficulty achieving NWB.  Toileting- Architect and Hygiene: Sit to/from stand;Total assistance;+2 for physical assistance       Functional mobility during ADLs: Maximal assistance;+2 for physical assistance;Rolling walker(stand-pivot only) General ADL Comments: Pt with difficulty maintaining NWB status despite OT supporting RLE with foot throughout transfer.      Vision Baseline Vision/History: Wears glasses Wears Glasses: Reading only Vision Assessment?: No apparent visual deficits     Perception  Praxis      Pertinent Vitals/Pain Pain Assessment: Faces Faces Pain Scale: Hurts whole lot Pain  Location: R hip Pain Descriptors / Indicators: Sore;Grimacing;Guarding Pain Intervention(s): Monitored during session;Repositioned     Hand Dominance     Extremity/Trunk Assessment Upper Extremity Assessment Upper Extremity Assessment: Generalized weakness   Lower Extremity Assessment Lower Extremity Assessment: Defer to PT evaluation RLE Deficits / Details: pt with decreased strength and ROM limitations secondary to post-op pain and weakness. RLE: Unable to fully assess due to pain   Cervical / Trunk Assessment Cervical / Trunk Assessment: Kyphotic   Communication Communication Communication: HOH   Cognition Arousal/Alertness: Awake/alert Behavior During Therapy: WFL for tasks assessed/performed Overall Cognitive Status: Impaired/Different from baseline Area of Impairment: Memory;Following commands;Safety/judgement;Problem solving                     Memory: Decreased recall of precautions;Decreased short-term memory Following Commands: Follows one step commands inconsistently;Follows one step commands with increased time Safety/Judgement: Decreased awareness of safety;Decreased awareness of deficits   Problem Solving: Slow processing;Decreased initiation;Difficulty sequencing;Requires verbal cues;Requires tactile cues General Comments: Pt with some difficulty processing information this session.    General Comments  Daughter present and engaged in session. Pt confused at end of session thinking that she was already at rehabilitation center.     Exercises     Shoulder Instructions      Home Living Family/patient expects to be discharged to:: Other (Comment)                                 Additional Comments: Independent living facility; level entry, handicap accessible, walk-in shower with a shower seat      Prior Functioning/Environment Level of Independence: Independent with assistive device(s)        Comments: pt ambulates with RW         OT Problem List: Decreased strength;Decreased range of motion;Decreased activity tolerance;Impaired balance (sitting and/or standing);Decreased safety awareness;Decreased knowledge of use of DME or AE;Decreased cognition;Decreased knowledge of precautions;Pain      OT Treatment/Interventions: Self-care/ADL training;Therapeutic exercise;Energy conservation;DME and/or AE instruction;Therapeutic activities;Patient/family education;Balance training    OT Goals(Current goals can be found in the care plan section) Acute Rehab OT Goals Patient Stated Goal: be more mobile OT Goal Formulation: With patient/family Time For Goal Achievement: 09/16/17 Potential to Achieve Goals: Good ADL Goals Pt Will Perform Lower Body Bathing: with mod assist;sit to/from stand Pt Will Perform Lower Body Dressing: with mod assist;sit to/from stand Pt Will Transfer to Toilet: with min assist;stand pivot transfer;bedside commode Pt Will Perform Toileting - Clothing Manipulation and hygiene: with min assist;sit to/from stand  OT Frequency: Min 2X/week   Barriers to D/C:            Co-evaluation PT/OT/SLP Co-Evaluation/Treatment: Yes Reason for Co-Treatment: For patient/therapist safety;To address functional/ADL transfers PT goals addressed during session: Mobility/safety with mobility;Balance;Proper use of DME;Strengthening/ROM OT goals addressed during session: ADL's and self-care;Strengthening/ROM;Proper use of Adaptive equipment and DME      AM-PAC PT "6 Clicks" Daily Activity     Outcome Measure Help from another person eating meals?: None Help from another person taking care of personal grooming?: None Help from another person toileting, which includes using toliet, bedpan, or urinal?: A Lot Help from another person bathing (including washing, rinsing, drying)?: A Lot Help from another person to put on and taking off regular upper body clothing?: A Little Help  from another person to put on and  taking off regular lower body clothing?: Total 6 Click Score: 16   End of Session Equipment Utilized During Treatment: Gait belt;Rolling walker Nurse Communication: Mobility status;Other (comment);Weight bearing status;Precautions(stand-pivot to the L back to bed with +2 assist)  Activity Tolerance: Patient tolerated treatment well Patient left: in chair;with call bell/phone within reach;with family/visitor present  OT Visit Diagnosis: Other abnormalities of gait and mobility (R26.89);Pain Pain - Right/Left: Right Pain - part of body: Leg                Time: 1610-9604 OT Time Calculation (min): 42 min Charges:  OT General Charges $OT Visit: 1 Visit OT Evaluation $OT Eval Moderate Complexity: 1 Mod G-Codes:     Doristine Section, MS OTR/L  Pager: [phone]   Finnean Cerami A Deavon Podgorski 09/02/2017, 5:48 PM

## 2017-09-03 ENCOUNTER — Inpatient Hospital Stay (HOSPITAL_COMMUNITY): Payer: Medicare Other

## 2017-09-03 LAB — CBC
HCT: 25.1 % — ABNORMAL LOW (ref 36.0–46.0)
Hemoglobin: 7.6 g/dL — ABNORMAL LOW (ref 12.0–15.0)
MCH: 28.3 pg (ref 26.0–34.0)
MCHC: 30.3 g/dL (ref 30.0–36.0)
MCV: 93.3 fL (ref 78.0–100.0)
Platelets: 180 K/uL (ref 150–400)
RBC: 2.69 MIL/uL — ABNORMAL LOW (ref 3.87–5.11)
RDW: 17.1 % — ABNORMAL HIGH (ref 11.5–15.5)
WBC: 11.3 10*3/uL — ABNORMAL HIGH (ref 4.0–10.5)

## 2017-09-03 LAB — BASIC METABOLIC PANEL WITH GFR
Calcium: 8.8 mg/dL — ABNORMAL LOW (ref 8.9–10.3)
GFR calc Af Amer: 17 mL/min — ABNORMAL LOW (ref >60)
GFR calc non Af Amer: 15 mL/min — ABNORMAL LOW (ref >60)
Sodium: 134 mmol/L — ABNORMAL LOW (ref 135–145)

## 2017-09-03 LAB — BASIC METABOLIC PANEL
Anion gap: 9 (ref 5–15)
BUN: 59 mg/dL — ABNORMAL HIGH (ref 6–20)
CO2: 24 mmol/L (ref 22–32)
Chloride: 101 mmol/L (ref 101–111)
Creatinine, Ser: 2.52 mg/dL — ABNORMAL HIGH (ref 0.44–1.00)
Glucose, Bld: 165 mg/dL — ABNORMAL HIGH (ref 65–99)
Potassium: 4.4 mmol/L (ref 3.5–5.1)

## 2017-09-03 LAB — MAGNESIUM: Magnesium: 2 mg/dL (ref 1.7–2.4)

## 2017-09-03 MED ORDER — SODIUM CHLORIDE 0.9 % IV SOLN
INTRAVENOUS | Status: AC
  Administered 2017-09-03 – 2017-09-04 (×2): via INTRAVENOUS

## 2017-09-03 MED ORDER — FLEET ENEMA 7-19 GM/118ML RE ENEM
1.0000 | ENEMA | Freq: Once | RECTAL | Status: AC
  Administered 2017-09-03: 1 via RECTAL
  Filled 2017-09-03: qty 1

## 2017-09-03 NOTE — Progress Notes (Signed)
PROGRESS NOTE    Genise Strack  ZOX:[id number] DOB: [birth year] DOA: 08/31/2017 PCP: Shirline Frees, NP    Brief Narrative:  84 year old with past medical history relevant for hypertension, diastolic heart failure with EF of 65 to 70% on 12/2014, stage IV CKD, anemia due to CKD, atrial fibrillation on apixaban, stage IV chronic kidney disease who presented to the emergency department at [facility] after a fall and found to have a distal femur fracture on the right.  Patient was transferred to [facility] for surgical repair.   Assessment & Plan:   Principal Problem:   Femur fracture, right (HCC) Active Problems:   Chronic atrial fibrillation (HCC)   Essential hypertension   CHF (congestive heart failure) (HCC)   CKD (chronic kidney disease), stage IV (HCC)   Anemia of chronic disease   Falls, initial encounter   #) Mechanical fall complicated by right distal femur fracture status post open reduction internal fixation on 09/01/2017:  -orthopedic consult, appreciate recommendations -Physical therapy afterwards, likely discharge to skilled nursing facility  #) Acute on chronic kidney injury: Patient with mild AKI after being placed on IV furosemide.  Likely secondary to overdiuresis.  Patient is on room air right now. -Gentle IV fluids -Discontinue IV Lasix  #) Acute blood loss anemia and hypoproliferative anemia: Likely acute blood loss on top of anemia of chronic disease/anemia due to CKD. -Status post 2 units packed red blood cells on 09/02/2017  #) Chronic atrial fibrillation: - Apixaban continue to hold as patient is quite anemic -Continue metoprolol succinate 100 mg daily  #) Chronic diastolic heart failure: Currently patient appears to be euvolemic - Continue metolazone 2.5 mg every Tuesday - Hold home furosemide 40 mg daily -Start IV furosemide  #) Pain/psych: - Continue paroxetine 10 mg daily  Fluids: Gentle IV fluids Elect lites: Monitor and supplement Nutrition:  heart  healthy diet  Prophylaxis: Enoxaparin  Disposition: Pending discharge to skilled nursing facilityv  Full code  Consultants:   Orthopedic surgery  Procedures:   None  Antimicrobials:   Perioperative antibiotics   Subjective: Patient reports some pain in her right leg.  She otherwise is quite frustrated as nobody is been helping feed her lunch.  She denies any nausea, vomiting, diarrhea.  Objective: Vitals:   09/02/17 1934 09/03/17 0036 09/03/17 0042 09/03/17 0500  BP: (!) 92/46     Pulse: 75     Resp: 12 12    Temp: 98.1 F (36.7 C)     TempSrc: Oral     SpO2: 100% (!) 88% 100%   Weight:    79.7 kg (175 lb 11.3 oz)    Intake/Output Summary (Last 24 hours) at 09/03/2017 1106 Last data filed at 09/02/2017 2300 Gross per 24 hour  Intake 924.22 ml  Output 300 ml  Net 624.22 ml   Filed Weights   09/01/17 1900 09/02/17 0500 09/03/17 0500  Weight: 82.5 kg (181 lb 14.1 oz) 82.5 kg (181 lb 14.1 oz) 79.7 kg (175 lb 11.3 oz)    Examination:  General exam: No acute distress Respiratory system: No increased work of breathing, lungs clear to auscultation, diminished air movement at bases, no wheezes or rhonchi Cardiovascular system: Distant heart sounds, irregularly regular, no murmurs Gastrointestinal system: Abdomen is nondistended, soft and nontender. No organomegaly or masses felt. Normal bowel sounds heard. Central nervous system: Alert and oriented. No focal neurological deficits. Extremities: Right lower extremity wrapped, distal perfusion intact Skin: Entire right lower extremity is wrapped Psychiatry: Judgement and insight appear  normal. Mood & affect appropriate.     Data Reviewed: I have personally reviewed following labs and imaging studies  CBC: Recent Labs  Lab 08/31/17 2307 09/01/17 0830 09/02/17 0347 09/02/17 1447 09/03/17 0415  WBC 6.7 10.2 9.3  --  11.3*  NEUTROABS 5.0  --   --   --   --   HGB 9.3* 7.9* 6.4* 7.9* 7.6*  HCT 30.8* 27.0* 21.9*  25.6* 25.1*  MCV 94.2 96.1 95.6  --  93.3  PLT 234 201 176  --  180   Basic Metabolic Panel: Recent Labs  Lab 08/31/17 2307 09/01/17 0830 09/02/17 0347 09/03/17 0415  NA 138  --  140 134*  K 5.0  --  4.6 4.4  CL 104  --  105 101  CO2 24  --  27 24  GLUCOSE 202*  --  171* 165*  BUN 44*  --  43* 59*  CREATININE 1.53* 1.58* 1.68* 2.52*  CALCIUM 9.4  --  9.1 8.8*  MG  --   --  1.9 2.0   GFR: Estimated Creatinine Clearance: 12.8 mL/min (A) (by C-G formula based on SCr of 2.52 mg/dL (H)). Liver Function Tests: No results for input(s): AST, ALT, ALKPHOS, BILITOT, PROT, ALBUMIN in the last 168 hours. No results for input(s): LIPASE, AMYLASE in the last 168 hours. No results for input(s): AMMONIA in the last 168 hours. Coagulation Profile: Recent Labs  Lab 08/31/17 2307  INR 1.46   Cardiac Enzymes: No results for input(s): CKTOTAL, CKMB, CKMBINDEX, TROPONINI in the last 168 hours. BNP (last 3 results) No results for input(s): PROBNP in the last 8760 hours. HbA1C: No results for input(s): HGBA1C in the last 72 hours. CBG: No results for input(s): GLUCAP in the last 168 hours. Lipid Profile: No results for input(s): CHOL, HDL, LDLCALC, TRIG, CHOLHDL, LDLDIRECT in the last 72 hours. Thyroid Function Tests: No results for input(s): TSH, T4TOTAL, FREET4, T3FREE, THYROIDAB in the last 72 hours. Anemia Panel: No results for input(s): VITAMINB12, FOLATE, FERRITIN, TIBC, IRON, RETICCTPCT in the last 72 hours. Sepsis Labs: No results for input(s): PROCALCITON, LATICACIDVEN in the last 168 hours.  Recent Results (from the past 240 hour(s))  Surgical pcr screen     Status: Abnormal   Collection Time: 09/01/17  3:59 AM  Result Value Ref Range Status   MRSA, PCR NEGATIVE NEGATIVE Final   Staphylococcus aureus POSITIVE (A) NEGATIVE Final    Comment: (NOTE) The Xpert SA Assay (FDA approved for NASAL specimens in patients 11 years of age and older), is one component of a  comprehensive surveillance program. It is not intended to diagnose infection nor to guide or monitor treatment. Performed at [facility], [address], [place], [place] [address]          Radiology Studies: Dg Chest Port 1 View  Result Date: 09/03/2017 CLINICAL DATA:  Hypoxia. EXAM: PORTABLE CHEST 1 VIEW COMPARISON:  09/02/2017 and older studies. FINDINGS: Cardiac silhouette is normal in size. No mediastinal or hilar masses. There are prominent bronchovascular markings bilaterally. Linear opacity consistent with scarring is noted in the left mid lung. These findings are stable. No evidence of pneumonia or pulmonary edema. No pleural effusion or pneumothorax. IMPRESSION: 1. No acute cardiopulmonary disease. 2. Chronic prominence of the bronchovascular markings and stable scarring in the left mid lung. Electronically Signed   By: Amie Portland M.D.   On: 09/03/2017 07:54   Dg Chest Port 1 View  Result Date: 09/02/2017 CLINICAL  DATA:  Shortness of breath EXAM: PORTABLE CHEST 1 VIEW COMPARISON:  08/31/2017 FINDINGS: Mild cardiac enlargement. Improved vascular congestion and interstitial edema since previous study. Linear atelectasis or fibrosis in the left mid lung. No blunting of costophrenic angles. No pneumothorax. Calcification of the aorta. Degenerative changes in the spine and shoulders. IMPRESSION: Cardiac enlargement with improved vascular congestion and edema since previous study. Electronically Signed   By: Burman NievesWilliam  Stevens M.D.   On: 09/02/2017 05:21   Dg C-arm 1-60 Min  Result Date: 09/01/2017 CLINICAL DATA:  (Orif) Right distal Femur Fracture. Fluoro time: 48 sec EXAM: RIGHT FEMUR 2 VIEWS; DG C-ARM 61-120 MIN COMPARISON:  08/31/2017 FINDINGS: The images demonstrate ORIF of the comminuted fracture the distal RIGHT femur with a LATERAL screw plate. Alignment is near anatomic. No interval fractures identified. IMPRESSION: ORIF of comminuted RIGHT femur fracture. Electronically  Signed   By: Norva PavlovElizabeth  Brown M.D.   On: 09/01/2017 15:24   Dg C-arm 1-60 Min  Result Date: 09/01/2017 CLINICAL DATA:  (Orif) Right distal Femur Fracture. Fluoro time: 48 sec EXAM: RIGHT FEMUR 2 VIEWS; DG C-ARM 61-120 MIN COMPARISON:  08/31/2017 FINDINGS: The images demonstrate ORIF of the comminuted fracture the distal RIGHT femur with a LATERAL screw plate. Alignment is near anatomic. No interval fractures identified. IMPRESSION: ORIF of comminuted RIGHT femur fracture. Electronically Signed   By: Norva PavlovElizabeth  Brown M.D.   On: 09/01/2017 15:24   Dg Femur, Min 2 Views Right  Result Date: 09/01/2017 CLINICAL DATA:  (Orif) Right distal Femur Fracture. Fluoro time: 48 sec EXAM: RIGHT FEMUR 2 VIEWS; DG C-ARM 61-120 MIN COMPARISON:  08/31/2017 FINDINGS: The images demonstrate ORIF of the comminuted fracture the distal RIGHT femur with a LATERAL screw plate. Alignment is near anatomic. No interval fractures identified. IMPRESSION: ORIF of comminuted RIGHT femur fracture. Electronically Signed   By: Norva PavlovElizabeth  Brown M.D.   On: 09/01/2017 15:24   Dg Femur Port, Min 2 Views Right  Result Date: 09/01/2017 CLINICAL DATA:  Right femur fracture post ORIF. EXAM: RIGHT FEMUR PORTABLE 2 VIEW COMPARISON:  Radiographs 08/31/2017. Intraoperative radiographs earlier today. FINDINGS: Status post lateral plate and screw fixation of the comminuted intra-articular fracture of the distal femur. Compared with the preoperative radiographs, there is improved alignment of the fracture with residual displacement at the metaphysis by 7 mm medially on the AP view and 14 mm posteriorly on the lateral view. No significant residual displacement of the intra-articular portion. Underlying tricompartmental degenerative changes are present at the knee. IMPRESSION: Improved alignment of the comminuted intra-articular fracture of the distal femur post ORIF. No demonstrated complication. Electronically Signed   By: Carey BullocksWilliam  Veazey M.D.   On:  09/01/2017 17:14        Scheduled Meds: . Chlorhexidine Gluconate Cloth  6 each Topical Daily  . docusate sodium  100 mg Oral BID  . enoxaparin (LOVENOX) injection  30 mg Subcutaneous Q24H  . [START ON 09/05/2017] metolazone  2.5 mg Oral Weekly  . metoprolol succinate  100 mg Oral Q breakfast  . mupirocin ointment  1 application Nasal BID  . pantoprazole  40 mg Oral Daily  . senna  1 tablet Oral Daily   Continuous Infusions: . sodium chloride 75 mL/hr at 09/03/17 1104  . lactated ringers Stopped (09/02/17 1514)  . methocarbamol (ROBAXIN)  IV       LOS: 2 days    Time spent: 35    Delaine LameShrey C Manvir Prabhu, MD [facility]   If 7PM-7AM, please contact night-coverage [email or web address]  Password [email or web address] 09/03/2017, 11:06 AM

## 2017-09-03 NOTE — Clinical Social Work Note (Signed)
Clinical Social Work Assessment  Patient Details  Name: Alyssa Cameron MRN: [id number] Date of Birth: 03/05/1919  Date of referral:  09/03/17               Reason for consult:  Facility Placement                Permission sought to share information with:    Permission granted to share information::  Yes, Release of Information Signed  Name::     Alyssa Cameron  Agency::  [facility]  Relationship::  daughter  Contact Information:     Housing/Transportation Living arrangements for the past 2 months:  Assisted Living Facility([facility]) Source of Information:  Adult Children Patient Interpreter Needed:  None Criminal Activity/Legal Involvement Pertinent to Current Situation/Hospitalization:  No - Comment as needed Significant Relationships:  Adult Children Lives with:  Self Do you feel safe going back to the place where you live?  No Need for family participation in patient care:  No (Coment)  Care giving concerns:  Pt is alert and oriented. Pt is hard of hearing. [name] was present at bedside during assessment. [facility] No additional caregivers noted at this time.   Social Worker assessment / plan:  CSW spoke with pt's daughter at bedside--pt was asleep. Pt's daughter is understanding of SNF recommendation and the only facility she prefers at this time is [facility]. Pt's daughter gave CSW verbal permission to f/o to [facility]. CSW also provided pt's daughter with a SNF list in the case that [facility] can not take pt on day of d/c. Pt's daughter to follow up with CSW regarding back up choices.   Employment status:  Retired Database administratornsurance information:  Managed Medicare PT Recommendations:  Skilled Nursing Facility Information / Referral to community resources:  Skilled Nursing Facility  Patient/Family's Response to care:  Pt's daughter verbalized understanding of CSW role and expressed appreciation for support. Pt's daughter denies any concern regarding pt  care at this time.   Patient/Family's Understanding of and Emotional Response to Diagnosis, Current Treatment, and Prognosis:  Pt's daughter understanding and realistic regarding pt's physical limitations. Pt's daughter understands the recommendation for pt to d/c to SNF--Pt's daughter agreeable at this time. Pt's daughter denies any concern regarding pt's treatment plan at this time. CSW will continue to provide support and facilitate d/c needs.   Emotional Assessment Appearance:  Appears stated age Attitude/Demeanor/Rapport:  Unable to Assess Affect (typically observed):  Unable to Assess Orientation:  Oriented to Place, Oriented to Situation, Oriented to  Time, Oriented to Self Alcohol / Substance use:  Not Applicable Psych involvement (Current and /or in the community):  No (Comment)  Discharge Needs  Concerns to be addressed:  Basic Needs, Care Coordination Readmission within the last 30 days:  No Current discharge risk:  Dependent with Mobility Barriers to Discharge:  Continued Medical Work up   Pacific MutualBridget A Jettie Mannor, LCSW 09/03/2017, 10:05 AM

## 2017-09-03 NOTE — Progress Notes (Signed)
Enema given to pt and pt was disimpacted. Pt states she feels much better. Pt also had slight nose bleed this morning. Report given to day shift nurse. Pt is in stable condition at the end of my shift. A&O x 4.

## 2017-09-03 NOTE — Progress Notes (Addendum)
Subjective: 2 Days Post-Op Procedure(s) (LRB): OPEN REDUCTION INTERNAL FIXATION (ORIF) DISTAL FEMUR FRACTURE (Right) Patient reports pain as mild and moderate   Much more comfortable after BM this AM(post enema) Objective: Vital signs in last 24 hours: Temp:  [97.4 F (36.3 C)-98.3 F (36.8 C)] 98.1 F (36.7 C) (06/22 1934) Pulse Rate:  [59-91] 75 (06/22 1934) Resp:  [12-16] 12 (06/23 0036) BP: (84-92)/(45-55) 92/46 (06/22 1934) SpO2:  [88 %-100 %] 100 % (06/23 0042) Weight:  [79.7 kg (175 lb 11.3 oz)] 79.7 kg (175 lb 11.3 oz) (06/23 0500)  Intake/Output from previous day: 06/22 0701 - 06/23 0700 In: 924.2 [P.O.:120; I.V.:754.2; IV Piggyback:50] Out: 300 [Urine:300] Intake/Output this shift: No intake/output data recorded.  Recent Labs    08/31/17 2307 09/01/17 0830 09/02/17 0347 09/02/17 1447 09/03/17 0415  HGB 9.3* 7.9* 6.4* 7.9* 7.6*   Recent Labs    09/02/17 0347 09/02/17 1447 09/03/17 0415  WBC 9.3  --  11.3*  RBC 2.29*  --  2.69*  HCT 21.9* 25.6* 25.1*  PLT 176  --  180   Recent Labs    09/02/17 0347 09/03/17 0415  NA 140 134*  K 4.6 4.4  CL 105 101  CO2 27 24  BUN 43* 59*  CREATININE 1.68* 2.52*  GLUCOSE 171* 165*  CALCIUM 9.1 8.8*   Recent Labs    08/31/17 2307  INR 1.46    ABD soft Neurovascular intact Sensation intact distally Intact pulses distally Dorsiflexion/Plantar flexion intact Compartment soft dressing dry  Principal Problem:   Femur fracture, right (HCC) Active Problems:   Chronic atrial fibrillation (HCC)   Essential hypertension   CHF (congestive heart failure) (HCC)   CKD (chronic kidney disease), stage IV (HCC)   Anemia of chronic disease   Falls, initial encounter   Assessment/Plan: 2 Days Post-Op Procedure(s) (LRB): OPEN REDUCTION INTERNAL FIXATION (ORIF) DISTAL FEMUR FRACTURE (Right) continue to plan for SNF/rehab continue with PT   BLAIR ROBERTS 09/03/2017, 1:04 PM

## 2017-09-03 NOTE — Progress Notes (Signed)
Spoke with MD on call regarding feeling very constipated and pt straining to have a BM after prune juice and stool softener. MD gave verbal order for enema.

## 2017-09-04 ENCOUNTER — Inpatient Hospital Stay (HOSPITAL_COMMUNITY): Payer: Medicare Other

## 2017-09-04 ENCOUNTER — Encounter (HOSPITAL_COMMUNITY): Payer: Self-pay | Admitting: Orthopedic Surgery

## 2017-09-04 LAB — CBC
HCT: 26.4 % — ABNORMAL LOW (ref 36.0–46.0)
HCT: 25.4 % — ABNORMAL LOW (ref 36.0–46.0)
HEMOGLOBIN: 7.7 g/dL — AB (ref 12.0–15.0)
Hemoglobin: 7.9 g/dL — ABNORMAL LOW (ref 12.0–15.0)
MCH: 28.6 pg (ref 26.0–34.0)
MCH: 29.3 pg (ref 26.0–34.0)
MCHC: 29.9 g/dL — ABNORMAL LOW (ref 30.0–36.0)
MCHC: 30.3 g/dL (ref 30.0–36.0)
MCV: 95.7 fL (ref 78.0–100.0)
MCV: 96.6 fL (ref 78.0–100.0)
PLATELETS: 184 10*3/uL (ref 150–400)
Platelets: 195 K/uL (ref 150–400)
RBC: 2.76 MIL/uL — ABNORMAL LOW (ref 3.87–5.11)
RBC: 2.63 MIL/uL — AB (ref 3.87–5.11)
RDW: 17.3 % — ABNORMAL HIGH (ref 11.5–15.5)
RDW: 17.2 % — ABNORMAL HIGH (ref 11.5–15.5)
WBC: 10.6 K/uL — ABNORMAL HIGH (ref 4.0–10.5)
WBC: 8.8 10*3/uL (ref 4.0–10.5)

## 2017-09-04 LAB — BLOOD GAS, ARTERIAL
Acid-Base Excess: 0.4 mmol/L (ref 0.0–2.0)
Bicarbonate: 24.4 mmol/L (ref 20.0–28.0)
Drawn by: 41977
FIO2: 100
O2 Saturation: 97.8 %
Patient temperature: 98.6
pCO2 arterial: 38.4 mmHg (ref 32.0–48.0)
pH, Arterial: 7.419 (ref 7.350–7.450)
pO2, Arterial: 162 mmHg — ABNORMAL HIGH (ref 83.0–108.0)

## 2017-09-04 LAB — PREPARE RBC (CROSSMATCH)

## 2017-09-04 LAB — BASIC METABOLIC PANEL WITH GFR
Anion gap: 11 (ref 5–15)
BUN: 66 mg/dL — ABNORMAL HIGH (ref 6–20)
CO2: 23 mmol/L (ref 22–32)
GFR calc non Af Amer: 15 mL/min — ABNORMAL LOW (ref >60)
Glucose, Bld: 134 mg/dL — ABNORMAL HIGH (ref 65–99)

## 2017-09-04 LAB — OSMOLALITY: Osmolality: 307 mOsm/kg — ABNORMAL HIGH (ref 275–295)

## 2017-09-04 LAB — BASIC METABOLIC PANEL
Calcium: 8.8 mg/dL — ABNORMAL LOW (ref 8.9–10.3)
Chloride: 98 mmol/L — ABNORMAL LOW (ref 101–111)
Creatinine, Ser: 2.58 mg/dL — ABNORMAL HIGH (ref 0.44–1.00)
GFR calc Af Amer: 17 mL/min — ABNORMAL LOW (ref >60)
Potassium: 4.5 mmol/L (ref 3.5–5.1)
Sodium: 132 mmol/L — ABNORMAL LOW (ref 135–145)

## 2017-09-04 MED ORDER — ACETAMINOPHEN 500 MG PO TABS: 500.0000 mg | ORAL_TABLET | Freq: Four times a day (QID) | ORAL | Status: DC | PRN

## 2017-09-04 MED ORDER — ACETAMINOPHEN 325 MG PO TABS
650.0000 mg | ORAL_TABLET | Freq: Once | ORAL | Status: AC
  Administered 2017-09-05: 650 mg via ORAL
  Filled 2017-09-04: qty 2

## 2017-09-04 MED ORDER — LORATADINE 10 MG PO TABS
10.0000 mg | ORAL_TABLET | Freq: Every day | ORAL | Status: DC
  Administered 2017-09-04 – 2017-09-06 (×3): 10 mg via ORAL
  Filled 2017-09-04 (×3): qty 1

## 2017-09-04 MED ORDER — SODIUM CHLORIDE 0.9% IV SOLUTION
Freq: Once | INTRAVENOUS | Status: AC
  Administered 2017-09-04: 23:00:00 via INTRAVENOUS

## 2017-09-04 MED ORDER — HYDROCODONE-ACETAMINOPHEN 5-325 MG PO TABS
1.0000 | ORAL_TABLET | Freq: Four times a day (QID) | ORAL | Status: DC | PRN
  Administered 2017-09-04 – 2017-09-06 (×7): 1 via ORAL
  Filled 2017-09-04 (×7): qty 1

## 2017-09-04 MED ORDER — APIXABAN 2.5 MG PO TABS
2.5000 mg | ORAL_TABLET | Freq: Two times a day (BID) | ORAL | Status: DC
  Administered 2017-09-04 – 2017-09-06 (×5): 2.5 mg via ORAL
  Filled 2017-09-04 (×5): qty 1

## 2017-09-04 MED ORDER — DIPHENHYDRAMINE HCL 25 MG PO CAPS
25.0000 mg | ORAL_CAPSULE | Freq: Once | ORAL | Status: AC
  Administered 2017-09-04: 25 mg via ORAL
  Filled 2017-09-04: qty 1

## 2017-09-04 MED ORDER — FUROSEMIDE 10 MG/ML IJ SOLN
20.0000 mg | Freq: Once | INTRAMUSCULAR | Status: AC
  Administered 2017-09-05: 20 mg via INTRAVENOUS
  Filled 2017-09-04: qty 2

## 2017-09-04 MED ORDER — ACETAMINOPHEN 500 MG PO TABS
500.0000 mg | ORAL_TABLET | Freq: Four times a day (QID) | ORAL | Status: DC
  Administered 2017-09-04 – 2017-09-06 (×7): 500 mg via ORAL
  Filled 2017-09-04 (×7): qty 1

## 2017-09-04 NOTE — Progress Notes (Signed)
Orthopaedic Trauma Service Progress Note  Subjective  Doing ok Moderate pain  Did gets some PRBCs over the weekend  Had BM yesterday   Mobility limited   BP soft   Review of Systems  Respiratory: Negative for shortness of breath.   Cardiovascular: Negative for chest pain and palpitations.  Gastrointestinal: Negative for abdominal pain, nausea and vomiting.  Neurological: Negative for tingling and sensory change.     Objective   BP (!) 97/46   Pulse (!) 56   Temp 97.6 F (36.4 C) (Oral)   Resp 13   Wt 83.4 kg (183 lb 13.8 oz)   SpO2 93%   BMI 33.63 kg/m   Intake/Output      06/23 0701 - 06/24 0700 06/24 0701 - 06/25 0700   P.O. 480    I.V. (mL/kg) 416.6 (5)    IV Piggyback 0    Total Intake(mL/kg) 896.6 (10.8)    Urine (mL/kg/hr) 300 (0.1)    Drains 0    Total Output 300    Net +596.6         Urine Occurrence 1 x      Labs  Results for Alyssa Cameron, Alyssa Cameron (MRN [id number]) as of 09/04/2017 09:30  Ref. Range 09/04/2017 04:03  BASIC METABOLIC PANEL Unknown Rpt (A)  Sodium Latest Ref Range: 135 - 145 mmol/L 132 (L)  Potassium Latest Ref Range: 3.5 - 5.1 mmol/L 4.5  Chloride Latest Ref Range: 101 - 111 mmol/L 98 (L)  CO2 Latest Ref Range: 22 - 32 mmol/L 23  Glucose Latest Ref Range: 65 - 99 mg/dL 409134 (H)  BUN Latest Ref Range: 6 - 20 mg/dL 66 (H)  Creatinine Latest Ref Range: 0.44 - 1.00 mg/dL 8.112.58 (H)  Calcium Latest Ref Range: 8.9 - 10.3 mg/dL 8.8 (L)  Anion gap Latest Ref Range: 5 - 15  11  GFR, Est Non African American Latest Ref Range: >60 mL/min 15 (L)  GFR, Est African American Latest Ref Range: >60 mL/min 17 (L)  WBC Latest Ref Range: 4.0 - 10.5 K/uL 10.6 (H)  RBC Latest Ref Range: 3.87 - 5.11 MIL/uL 2.76 (L)  Hemoglobin Latest Ref Range: 12.0 - 15.0 g/dL 7.9 (L)  HCT Latest Ref Range: 36.0 - 46.0 % 26.4 (L)  MCV Latest Ref Range: 78.0 - 100.0 fL 95.7  MCH Latest Ref Range: 26.0 - 34.0 pg 28.6  MCHC Latest Ref Range: 30.0 - 36.0 g/dL 91.429.9 (L)  RDW Latest  Ref Range: 11.5 - 15.5 % 17.3 (H)  Platelets Latest Ref Range: 150 - 400 K/uL 195    Exam  Gen: in bed, NAD Lungs: breathing unlabored Cardiac: RRR Abd: + BS, NTND Ext:       Right Lower Extremity   Incisional vac removed   Incisions look fantastic    No drainage   No signs of infection   Extensive ecchymosis throughout R leg  Moderate knee effusion/hemarthrosis persists   Distal motor and sensory functions intact  Ext warm   + DP pulse  Swelling stable  Tolerating TED hose    Assessment and Plan   POD/HD#: 233  84 y/o female s/p fall with comminuted R distal femur fracture s/p ORIF   - fall  -R distal femur fracture s/p ORIF  NWB x 6-8 weeks  Unrestricted ROM R knee and ankle   Do not let knee rest in flexion    Place pillows under ankle to keep knee in full extension at rest  PT/OT  Ice and  elevate  Dressing changes as needed   Can clean wounds with soap and water only   TED hose during the day, ok to leave off at night    SNF   - Pain management:  Low dose narcotics   Scheduled tylenol   - ABL anemia/Hemodynamics  Check cbc this pm   May need more blood products (prbcs)  - Medical issues   Per primary   - DVT/PE prophylaxis:  Currently on lovenox, renal dose   Think it would be ok to put her back on her eliquis as it is a low dose and the dosing that is normally used after ortho procedures   Defer to medicine service   - ID:   periop abx completed   - Metabolic Bone Disease:  Check vitamin d levels  - Activity:  NWB R leg  ROM as tolerated R knee   - FEN/GI prophylaxis/Foley/Lines:  Reg diet    - Dispo:  Continue with therapies   SW for snf placement     Mearl Latin, PA-C [facility] [phone] (P) [phone] (O) 09/04/2017 9:28 AM

## 2017-09-04 NOTE — Progress Notes (Signed)
PROGRESS NOTE    Tonny BranchRita Trott  ZOX:[id number] DOB: [birth year] DOA: 08/31/2017 PCP: Shirline FreesNafziger, Cory, NP    Brief Narrative:  84 year old with past medical history relevant for hypertension, diastolic heart failure with EF of 65 to 70% on 12/2014, stage IV CKD, anemia due to CKD, atrial fibrillation on apixaban, stage IV chronic kidney disease who presented [facility] the [facility] department [facility] after a [facility] and found to have a distal femur fracture on the right.  Patient [facility].   Assessment & Plan:   Principal Problem:   Femur fracture, right (HCC) Active Problems:   Chronic atrial fibrillation (HCC)   Essential hypertension   CHF (congestive heart failure) (HCC)   CKD (chronic kidney disease), stage IV (HCC)   Anemia of chronic disease   Falls, initial encounter   #) Mechanical fall complicated by right distal femur fracture status post open reduction internal fixation on 09/01/2017:  -orthopedic consult, appreciate recommendations -Discharge to skilled nursing facility once AKI resolves -Discontinue enoxaparin  #) Acute on chronic kidney injury: Secondary to diuresis after blood transfusion -Continue gentle IV fluids   #) Acute blood loss anemia and hypoproliferative anemia: Acute blood loss after surgery and CKD.  Stable currently -Status post 2 units packed red blood cells on 09/02/2017  #) Chronic atrial fibrillation: - Restart apixaban  -Continue metoprolol succinate 100 mg daily  #) Chronic diastolic heart failure: Currently patient appears to be euvolemic - Continue metolazone 2.5 mg every Tuesday - Hold home furosemide 40 mg daily  #) Pain/psych: - Continue paroxetine 10 mg daily  Fluids: Gentle IV fluids Elect lites: Monitor and supplement Nutrition:  heart healthy diet  Prophylaxis: Apixaban  Disposition: Pending discharge to skilled nursing facilityv  Full code  Consultants:   Orthopedic surgery  Procedures:     None  Antimicrobials:   Perioperative antibiotics   Subjective: Patient continues to report significant pain in her right leg.  She denies any nausea, vomiting, diarrhea.  Objective: Vitals:   09/03/17 1954 09/03/17 2050 09/04/17 0300 09/04/17 0902  BP: (!) 81/65 (!) 105/42  (!) 97/46  Pulse: 69 62  (!) 56  Resp: 13 13    Temp: 97.6 F (36.4 C) 97.6 F (36.4 C)    TempSrc: Oral Oral    SpO2: 93% 93%    Weight:   83.4 kg (183 lb 13.8 oz)     Intake/Output Summary (Last 24 hours) at 09/04/2017 1050 Last data filed at 09/04/2017 0430 Gross per 24 hour  Intake 776.56 ml  Output 300 ml  Net 476.56 ml   Filed Weights   09/02/17 0500 09/03/17 0500 09/04/17 0300  Weight: 82.5 kg (181 lb 14.1 oz) 79.7 kg (175 lb 11.3 oz) 83.4 kg (183 lb 13.8 oz)    Examination:  General exam: No acute distress Respiratory system: No increased work of breathing, lungs clear to auscultation, diminished air movement at bases, no wheezes or rhonchi Cardiovascular system: Distant heart sounds, irregularly regular, no murmurs Gastrointestinal system: Abdomen is nondistended, soft and nontender. No organomegaly or masses felt. Normal bowel sounds heard. Central nervous system: Alert and oriented. No focal neurological deficits. Extremities: Right lower extremity wrapped, distal perfusion intact Skin: Incision site is wrapped, clean dry and intact Psychiatry: Judgement and insight appear normal. Mood & affect appropriate.     Data Reviewed: I have personally reviewed following labs and imaging studies  CBC: Recent Labs  Lab 08/31/17 2307 09/01/17 0830 09/02/17 0347 09/02/17 1447  09/03/17 0415 09/04/17 0403  WBC 6.7 10.2 9.3  --  11.3* 10.6*  NEUTROABS 5.0  --   --   --   --   --   HGB 9.3* 7.9* 6.4* 7.9* 7.6* 7.9*  HCT 30.8* 27.0* 21.9* 25.6* 25.1* 26.4*  MCV 94.2 96.1 95.6  --  93.3 95.7  PLT 234 201 176  --  180 195   Basic Metabolic Panel: Recent Labs  Lab 08/31/17 2307  09/01/17 0830 09/02/17 0347 09/03/17 0415 09/04/17 0403  NA 138  --  140 134* 132*  K 5.0  --  4.6 4.4 4.5  CL 104  --  105 101 98*  CO2 24  --  27 24 23   GLUCOSE 202*  --  171* 165* 134*  BUN 44*  --  43* 59* 66*  CREATININE 1.53* 1.58* 1.68* 2.52* 2.58*  CALCIUM 9.4  --  9.1 8.8* 8.8*  MG  --   --  1.9 2.0  --    GFR: Estimated Creatinine Clearance: 12.8 mL/min (A) (by C-G formula based on SCr of 2.58 mg/dL (H)). Liver Function Tests: No results for input(s): AST, ALT, ALKPHOS, BILITOT, PROT, ALBUMIN in the last 168 hours. No results for input(s): LIPASE, AMYLASE in the last 168 hours. No results for input(s): AMMONIA in the last 168 hours. Coagulation Profile: Recent Labs  Lab 08/31/17 2307  INR 1.46   Cardiac Enzymes: No results for input(s): CKTOTAL, CKMB, CKMBINDEX, TROPONINI in the last 168 hours. BNP (last 3 results) No results for input(s): PROBNP in the last 8760 hours. HbA1C: No results for input(s): HGBA1C in the last 72 hours. CBG: No results for input(s): GLUCAP in the last 168 hours. Lipid Profile: No results for input(s): CHOL, HDL, LDLCALC, TRIG, CHOLHDL, LDLDIRECT in the last 72 hours. Thyroid Function Tests: No results for input(s): TSH, T4TOTAL, FREET4, T3FREE, THYROIDAB in the last 72 hours. Anemia Panel: No results for input(s): VITAMINB12, FOLATE, FERRITIN, TIBC, IRON, RETICCTPCT in the last 72 hours. Sepsis Labs: No results for input(s): PROCALCITON, LATICACIDVEN in the last 168 hours.  Recent Results (from the past 240 hour(s))  Surgical pcr screen     Status: Abnormal   Collection Time: 09/01/17  3:59 AM  Result Value Ref Range Status   MRSA, PCR NEGATIVE NEGATIVE Final   Staphylococcus aureus POSITIVE (A) NEGATIVE Final    Comment: (NOTE) The Xpert SA Assay (FDA approved for NASAL specimens in patients 51 years of age and older), is one component of a comprehensive surveillance program. It is not intended to diagnose infection nor  to guide or monitor treatment. Performed at [facility], [address] [place], [place] [address]          Radiology Studies: Dg Chest [place] 1 View  Result Date: 09/04/2017 CLINICAL DATA:  84 year old female with hypoxia EXAM: PORTABLE CHEST 1 VIEW COMPARISON:  Prior chest x-ray obtained yesterday, 09/03/2017 FINDINGS: Lower inspiratory volumes with slightly increased bibasilar linear opacities favored to reflect atelectasis. Background bronchitic changes remain stable. Aortic atherosclerosis. Mild cardiomegaly. No new pneumothorax or large pleural effusion. No acute osseous abnormality. Left glenohumeral joint osteoarthritis. IMPRESSION: 1. Decreasing inspiratory volumes with increasing bibasilar atelectasis. 2. Stable cardiomegaly. 3. Aortic atherosclerosis. Electronically Signed   By: Malachy Moan M.D.   On: 09/04/2017 08:18   Dg Chest [place] 1 View  Result Date: 09/03/2017 CLINICAL DATA:  Hypoxia. EXAM: PORTABLE CHEST 1 VIEW COMPARISON:  09/02/2017 and older studies. FINDINGS: Cardiac silhouette is normal in  size. No mediastinal or hilar masses. There are prominent bronchovascular markings bilaterally. Linear opacity consistent with scarring is noted in the left mid lung. These findings are stable. No evidence of pneumonia or pulmonary edema. No pleural effusion or pneumothorax. IMPRESSION: 1. No acute cardiopulmonary disease. 2. Chronic prominence of the bronchovascular markings and stable scarring in the left mid lung. Electronically Signed   By: Amie Portland M.D.   On: 09/03/2017 07:54        Scheduled Meds: . acetaminophen  500 mg Oral Q6H  . Chlorhexidine Gluconate Cloth  6 each Topical Daily  . docusate sodium  100 mg Oral BID  . enoxaparin (LOVENOX) injection  30 mg Subcutaneous Q24H  . [START ON 09/05/2017] metolazone  2.5 mg Oral Weekly  . metoprolol succinate  100 mg Oral Q breakfast  . mupirocin ointment  1 application Nasal BID  . pantoprazole  40 mg Oral  Daily  . senna  1 tablet Oral Daily   Continuous Infusions: . sodium chloride 75 mL/hr at 09/03/17 1739  . lactated ringers Stopped (09/02/17 1514)  . methocarbamol (ROBAXIN)  IV       LOS: 3 days    Time spent: 35    Delaine Lame, MD [facility]   If 7PM-7AM, please contact night-coverage [email or web address] Password [email or web address] 09/04/2017, 10:50 AM

## 2017-09-04 NOTE — Anesthesia Postprocedure Evaluation (Signed)
Anesthesia Post Note  Patient: Alyssa Cameron  Procedure(s) Performed: OPEN REDUCTION INTERNAL FIXATION (ORIF) DISTAL FEMUR FRACTURE (Right )     Patient location during evaluation: PACU Anesthesia Type: Regional and General Level of consciousness: awake and alert Pain management: pain level controlled Vital Signs Assessment: post-procedure vital signs reviewed and stable Respiratory status: spontaneous breathing, nonlabored ventilation, respiratory function stable and patient connected to nasal cannula oxygen Cardiovascular status: blood pressure returned to baseline and stable Postop Assessment: no apparent nausea or vomiting Anesthetic complications: no    Last Vitals:  Vitals:   09/03/17 1954 09/03/17 2050  BP: (!) 81/65 (!) 105/42  Pulse: 69 62  Resp: 13 13  Temp: 36.4 C 36.4 C  SpO2: 93% 93%    Last Pain:  Vitals:   09/03/17 2252  TempSrc:   PainSc: Asleep                 Alyssa Cameron

## 2017-09-04 NOTE — Social Work (Signed)
CSW f/u on disposition.  CSW called daughter to discuss SNF offers. Daughter has accepted SNF bed from [facility].  CSW contacted [facility] and they will initiate Insurance Auth.  Keene BreathPatricia Tamaj Jurgens, LCSW Clinical Social Worker [phone]

## 2017-09-04 NOTE — Care Management Important Message (Signed)
Important Message  Patient Details  Name: Alyssa Cameron MRN: [id number] Date of Birth: [birth year]   Medicare Important Message Given:  Yes    Natonya Finstad 09/04/2017, 4:00 PM

## 2017-09-04 NOTE — Discharge Instructions (Addendum)
Orthopaedic Trauma Service Discharge Instructions   General Discharge Instructions  WEIGHT BEARING STATUS:  Nonweightbearing Right Leg   RANGE OF MOTION/ACTIVITY: unrestricted Range of motion R knee and hip.   Wound Care: daily wound care. See below   Discharge Wound Care Instructions  Do NOT apply any ointments, solutions or lotions to pin sites or surgical wounds.  These prevent needed drainage and even though solutions like hydrogen peroxide kill bacteria, they also damage cells lining the pin sites that help fight infection.  Applying lotions or ointments can keep the wounds moist and can cause them to breakdown and open up as well. This can increase the risk for infection. When in doubt call the office.  Surgical incisions should be dressed daily.  If any drainage is noted, use one layer of adaptic, then gauze, Kerlix, and an ace wrap.  Once the incision is completely dry and without drainage, it may be left open to air out.  Showering may begin 36-48 hours later.  Cleaning gently with soap and water.  Traumatic wounds should be dressed daily as well.    One layer of adaptic, gauze, Kerlix, then ace wrap.  The adaptic can be discontinued once the draining has ceased    If you have a wet to dry dressing: wet the gauze with saline the squeeze as much saline out so the gauze is moist (not soaking wet), place moistened gauze over wound, then place a dry gauze over the moist one, followed by Kerlix wrap, then ace wrap.  DVT/PE prophylaxis: eliquis, pt on long term   Diet: as you were eating previously.  Can use over the counter stool softeners and bowel preparations, such as Miralax, to help with bowel movements.  Narcotics can be constipating.  Be sure to drink plenty of fluids  PAIN MEDICATION USE AND EXPECTATIONS  You have likely been given narcotic medications to help control your pain.  After a traumatic event that results in an fracture (broken bone) with or without surgery, it  is ok to use narcotic pain medications to help control one's pain.  We understand that everyone responds to pain differently and each individual patient will be evaluated on a regular basis for the continued need for narcotic medications. Ideally, narcotic medication use should last no more than 6-8 weeks (coinciding with fracture healing).   As a patient it is your responsibility as well to monitor narcotic medication use and report the amount and frequency you use these medications when you come to your office visit.   We would also advise that if you are using narcotic medications, you should take a dose prior to therapy to maximize you participation.  IF YOU ARE ON NARCOTIC MEDICATIONS IT IS NOT PERMISSIBLE TO OPERATE A MOTOR VEHICLE (MOTORCYCLE/CAR/TRUCK/MOPED) OR HEAVY MACHINERY DO NOT MIX NARCOTICS WITH OTHER CNS (CENTRAL NERVOUS SYSTEM) DEPRESSANTS SUCH AS ALCOHOL   STOP SMOKING OR USING NICOTINE PRODUCTS!!!!  As discussed nicotine severely impairs your body's ability to heal surgical and traumatic wounds but also impairs bone healing.  Wounds and bone heal by forming microscopic blood vessels (angiogenesis) and nicotine is a vasoconstrictor (essentially, shrinks blood vessels).  Therefore, if vasoconstriction occurs to these microscopic blood vessels they essentially disappear and are unable to deliver necessary nutrients to the healing tissue.  This is one modifiable factor that you can do to dramatically increase your chances of healing your injury.    (This means no smoking, no nicotine gum, patches, etc)  DO NOT USE NONSTEROIDAL  ANTI-INFLAMMATORY DRUGS (NSAID'S)  Using products such as Advil (ibuprofen), Aleve (naproxen), Motrin (ibuprofen) for additional pain control during fracture healing can delay and/or prevent the healing response.  If you would like to take over the counter (OTC) medication, Tylenol (acetaminophen) is ok.  However, some narcotic medications that are given for pain  control contain acetaminophen as well. Therefore, you should not exceed more than 4000 mg of tylenol in a day if you do not have liver disease.  Also note that there are may OTC medicines, such as cold medicines and allergy medicines that my contain tylenol as well.  If you have any questions about medications and/or interactions please ask your doctor/PA or your pharmacist.      ICE AND ELEVATE INJURED/OPERATIVE EXTREMITY  Using ice and elevating the injured extremity above your heart can help with swelling and pain control.  Icing in a pulsatile fashion, such as 20 minutes on and 20 minutes off, can be followed.    Do not place ice directly on skin. Make sure there is a barrier between to skin and the ice pack.    Using frozen items such as frozen peas works well as the conform nicely to the are that needs to be iced.  USE AN ACE WRAP OR TED HOSE FOR SWELLING CONTROL  In addition to icing and elevation, Ace wraps or TED hose are used to help limit and resolve swelling.  It is recommended to use Ace wraps or TED hose until you are informed to stop.    When using Ace Wraps start the wrapping distally (farthest away from the body) and wrap proximally (closer to the body)   Example: If you had surgery on your leg or thing and you do not have a splint on, start the ace wrap at the toes and work your way up to the thigh        If you had surgery on your upper extremity and do not have a splint on, start the ace wrap at your fingers and work your way up to the upper arm  IF YOU ARE IN A SPLINT OR CAST DO NOT REMOVE IT FOR ANY REASON   If your splint gets wet for any reason please contact the office immediately. You may shower in your splint or cast as long as you keep it dry.  This can be done by wrapping in a cast cover or garbage back (or similar)  Do Not stick any thing down your splint or cast such as pencils, money, or hangers to try and scratch yourself with.  If you feel itchy take benadryl as  prescribed on the bottle for itching  IF YOU ARE IN A CAM BOOT (BLACK BOOT)  You may remove boot periodically. Perform daily dressing changes as noted below.  Wash the liner of the boot regularly and wear a sock when wearing the boot. It is recommended that you sleep in the boot until told otherwise  CALL THE OFFICE WITH ANY QUESTIONS OR CONCERNS: [phone]      Information on my medicine - ELIQUIS (apixaban)  This medication education was reviewed with me or my healthcare representative as part of my discharge preparation.  The pharmacist that spoke with me during my hospital stay was:  Lennon Alstrom, [facility]  Why was Eliquis prescribed for you? Eliquis was prescribed for you to reduce the risk of a blood clot forming that can cause a stroke if you have a medical condition called atrial fibrillation (  a type of irregular heartbeat).  What do You need to know about Eliquis ? Take your Eliquis TWICE DAILY - one tablet in the morning and one tablet in the evening with or without food. If you have difficulty swallowing the tablet whole please discuss with your pharmacist how to take the medication safely.  Take Eliquis exactly as prescribed by your doctor and DO NOT stop taking Eliquis without talking to the doctor who prescribed the medication.  Stopping may increase your risk of developing a stroke.  Refill your prescription before you run out.  After discharge, you should have regular check-up appointments with your healthcare provider that is prescribing your Eliquis.  In the future your dose may need to be changed if your kidney function or weight changes by a significant amount or as you get older.  What do you do if you miss a dose? If you miss a dose, take it as soon as you remember on the same day and resume taking twice daily.  Do not take more than one dose of ELIQUIS at the same time to make up a missed dose.  Important Safety Information A possible side effect of  Eliquis is bleeding. You should call your healthcare provider right away if you experience any of the following: ? Bleeding from an injury or your nose that does not stop. ? Unusual colored urine (red or dark brown) or unusual colored stools (red or black). ? Unusual bruising for unknown reasons. ? A serious fall or if you hit your head (even if there is no bleeding).  Some medicines may interact with Eliquis and might increase your risk of bleeding or clotting while on Eliquis. To help avoid this, consult your healthcare provider or pharmacist prior to using any new prescription or non-prescription medications, including herbals, vitamins, non-steroidal anti-inflammatory drugs (NSAIDs) and supplements.  This website has more information on Eliquis (apixaban): http://www.eliquis.com/eliquis/home

## 2017-09-05 LAB — TYPE AND SCREEN
ABO/RH(D): O POS
ABO/RH(D): O POS
Antibody Screen: NEGATIVE
Antibody Screen: NEGATIVE
UNIT DIVISION: 0
UNIT DIVISION: 0
UNIT DIVISION: 0
Unit division: 0

## 2017-09-05 LAB — CBC
HCT: 28.9 % — ABNORMAL LOW (ref 36.0–46.0)
Hemoglobin: 8.9 g/dL — ABNORMAL LOW (ref 12.0–15.0)
MCH: 28.9 pg (ref 26.0–34.0)
MCHC: 30.8 g/dL (ref 30.0–36.0)
MCV: 93.8 fL (ref 78.0–100.0)
Platelets: 165 10*3/uL (ref 150–400)
RBC: 3.08 MIL/uL — ABNORMAL LOW (ref 3.87–5.11)
RDW: 16.7 % — ABNORMAL HIGH (ref 11.5–15.5)
WBC: 7.5 K/uL (ref 4.0–10.5)

## 2017-09-05 LAB — RENAL FUNCTION PANEL
Albumin: 3 g/dL — ABNORMAL LOW (ref 3.5–5.0)
Anion gap: 11 (ref 5–15)
BUN: 58 mg/dL — ABNORMAL HIGH (ref 8–23)
CO2: 25 mmol/L (ref 22–32)
Calcium: 8.8 mg/dL — ABNORMAL LOW (ref 8.9–10.3)
Chloride: 99 mmol/L (ref 98–111)
Creatinine, Ser: 2.21 mg/dL — ABNORMAL HIGH (ref 0.44–1.00)
GFR calc Af Amer: 20 mL/min — ABNORMAL LOW (ref >60)
GFR calc non Af Amer: 18 mL/min — ABNORMAL LOW (ref >60)
Glucose, Bld: 177 mg/dL — ABNORMAL HIGH (ref 70–99)
Phosphorus: 3.4 mg/dL (ref 2.5–4.6)
Potassium: 4.3 mmol/L (ref 3.5–5.1)
Sodium: 135 mmol/L (ref 135–145)

## 2017-09-05 LAB — BPAM RBC
Blood Product Expiration Date: 201907142359
Blood Product Expiration Date: 201907142359
Blood Product Expiration Date: 201907192359
Blood Product Expiration Date: 201907202359
ISSUE DATE / TIME: 201906220608
ISSUE DATE / TIME: 201906221044
ISSUE DATE / TIME: 201906242325
ISSUE DATE / TIME: 201906250552
Unit Type and Rh: 5100
Unit Type and Rh: 5100
Unit Type and Rh: 5100
Unit Type and Rh: 5100

## 2017-09-05 MED ORDER — POLYETHYLENE GLYCOL 3350 17 G PO PACK
17.0000 g | PACK | Freq: Two times a day (BID) | ORAL | Status: DC
  Administered 2017-09-05 – 2017-09-06 (×3): 17 g via ORAL
  Filled 2017-09-05 (×3): qty 1

## 2017-09-05 NOTE — Progress Notes (Signed)
PROGRESS NOTE    Alyssa Cameron  ZOX:[id number] DOB: 10-05-1918 DOA: 08/31/2017 PCP: Shirline Frees, NP    Brief Narrative:  84 year old with past medical history relevant for hypertension, diastolic heart failure with EF of 65 to 70% on 12/2014, stage IV CKD, anemia due to CKD, atrial fibrillation on apixaban, stage IV chronic kidney disease who presented to the emergency department at [facility] after a fall and found to have a distal femur fracture on the right.  Patient was transferred to [facility] for surgical repair.   Assessment & Plan:   Principal Problem:   Femur fracture, right (HCC) Active Problems:   Chronic atrial fibrillation (HCC)   Essential hypertension   CHF (congestive heart failure) (HCC)   CKD (chronic kidney disease), stage IV (HCC)   Anemia of chronic disease   Falls, initial encounter   #) Mechanical fall complicated by right distal femur fracture status post open reduction internal fixation on 09/01/2017:  -orthopedic consult, appreciate recommendations -Discharge to skilled nursing facility once AKI resolves  #) Acute on chronic kidney injury: Improving however still not to baseline -Continue gentle IV fluids   #) Acute blood loss anemia and hypoproliferative anemia: Acute blood loss after surgery and CKD.  Stable currently -Status post 2 units packed red blood cells on 09/02/2017 -Status post 1 unit packed red blood cells on 09/04/2017  #) Chronic atrial fibrillation: - Continue apixaban  -Continue metoprolol succinate 100 mg daily  #) Chronic diastolic heart failure: Currently patient appears to be euvolemic - Continue metolazone 2.5 mg every Tuesday - Hold home furosemide 40 mg daily  #) Pain/psych: - Continue paroxetine 10 mg daily  Fluids: Gentle IV fluids Elect lites: Monitor and supplement Nutrition:  heart healthy diet  Prophylaxis: Apixaban  Disposition: Pending baseline creatinine and discharge to skilled nursing facility  Full  code  Consultants:   Orthopedic surgery  Procedures:   None  Antimicrobials:   Perioperative antibiotics   Subjective: Patient reports that she is doing better.  She continues to report some cramping in her right lower extremity however reports that the pain is much better controlled and she is not quite as uncomfortable.  She denies any nausea, vomiting, diarrhea.  Objective: Vitals:   09/04/17 2334 09/04/17 2356 09/05/17 0208 09/05/17 0553  BP: 121/87 (!) 125/57 (!) 116/95 (!) 107/44  Pulse: 72 85 90 85  Resp:      Temp: 98.4 F (36.9 C) 98.7 F (37.1 C) 98.3 F (36.8 C) 97.8 F (36.6 C)  TempSrc: Oral Oral Oral Oral  SpO2: 93% 98% 100% 100%  Weight:       No intake or output data in the 24 hours ending 09/05/17 1006 Filed Weights   09/02/17 0500 09/03/17 0500 09/04/17 0300  Weight: 82.5 kg (181 lb 14.1 oz) 79.7 kg (175 lb 11.3 oz) 83.4 kg (183 lb 13.8 oz)    Examination:  General exam: No acute distress Respiratory system: No increased work of breathing, lungs clear to auscultation, diminished air movement at bases, no wheezes or rhonchi Cardiovascular system: Distant heart sounds, irregularly regular, no murmurs Gastrointestinal system: Abdomen is nondistended, soft and nontender. No organomegaly or masses felt. Normal bowel sounds heard. Central nervous system: Alert and oriented. No focal neurological deficits. Extremities: Right lower extremity wrapped, distal perfusion intact Skin: Incision site is wrapped, clean dry and intact Psychiatry: Judgement and insight appear normal. Mood & affect appropriate.     Data Reviewed: I have personally reviewed following labs and imaging studies  CBC: Recent Labs  Lab 08/31/17 2307  09/02/17 0347 09/02/17 1447 09/03/17 0415 09/04/17 0403 09/04/17 1150 09/05/17 0428  WBC 6.7   < > 9.3  --  11.3* 10.6* 8.8 7.5  NEUTROABS 5.0  --   --   --   --   --   --   --   HGB 9.3*   < > 6.4* 7.9* 7.6* 7.9* 7.7* 8.9*   HCT 30.8*   < > 21.9* 25.6* 25.1* 26.4* 25.4* 28.9*  MCV 94.2   < > 95.6  --  93.3 95.7 96.6 93.8  PLT 234   < > 176  --  180 195 184 165   < > = values in this interval not displayed.   Basic Metabolic Panel: Recent Labs  Lab 08/31/17 2307 09/01/17 0830 09/02/17 0347 09/03/17 0415 09/04/17 0403 09/05/17 0428  NA 138  --  140 134* 132* 135  K 5.0  --  4.6 4.4 4.5 4.3  CL 104  --  105 101 98* 99  CO2 24  --  27 24 23 25   GLUCOSE 202*  --  171* 165* 134* 177*  BUN 44*  --  43* 59* 66* 58*  CREATININE 1.53* 1.58* 1.68* 2.52* 2.58* 2.21*  CALCIUM 9.4  --  9.1 8.8* 8.8* 8.8*  MG  --   --  1.9 2.0  --   --   PHOS  --   --   --   --   --  3.4   GFR: Estimated Creatinine Clearance: 14.9 mL/min (A) (by C-G formula based on SCr of 2.21 mg/dL (H)). Liver Function Tests: Recent Labs  Lab 09/05/17 0428  ALBUMIN 3.0*   No results for input(s): LIPASE, AMYLASE in the last 168 hours. No results for input(s): AMMONIA in the last 168 hours. Coagulation Profile: Recent Labs  Lab 08/31/17 2307  INR 1.46   Cardiac Enzymes: No results for input(s): CKTOTAL, CKMB, CKMBINDEX, TROPONINI in the last 168 hours. BNP (last 3 results) No results for input(s): PROBNP in the last 8760 hours. HbA1C: No results for input(s): HGBA1C in the last 72 hours. CBG: No results for input(s): GLUCAP in the last 168 hours. Lipid Profile: No results for input(s): CHOL, HDL, LDLCALC, TRIG, CHOLHDL, LDLDIRECT in the last 72 hours. Thyroid Function Tests: No results for input(s): TSH, T4TOTAL, FREET4, T3FREE, THYROIDAB in the last 72 hours. Anemia Panel: No results for input(s): VITAMINB12, FOLATE, FERRITIN, TIBC, IRON, RETICCTPCT in the last 72 hours. Sepsis Labs: No results for input(s): PROCALCITON, LATICACIDVEN in the last 168 hours.  Recent Results (from the past 240 hour(s))  Surgical pcr screen     Status: Abnormal   Collection Time: 09/01/17  3:59 AM  Result Value Ref Range Status   MRSA, PCR  NEGATIVE NEGATIVE Final   Staphylococcus aureus POSITIVE (A) NEGATIVE Final    Comment: (NOTE) The Xpert SA Assay (FDA approved for NASAL specimens in patients 322 years of age and older), is one component of a comprehensive surveillance program. It is not intended to diagnose infection nor to guide or monitor treatment. Performed at [facility], [address] [place], [place] [address]          Radiology Studies: Dg Chest Patterson TractPort 1 View  Result Date: 09/04/2017 CLINICAL DATA:  84 year old female with hypoxia EXAM: PORTABLE CHEST 1 VIEW COMPARISON:  Prior chest x-ray obtained yesterday, 09/03/2017 FINDINGS: Lower inspiratory volumes with slightly increased bibasilar linear opacities favored to reflect atelectasis.  Background bronchitic changes remain stable. Aortic atherosclerosis. Mild cardiomegaly. No new pneumothorax or large pleural effusion. No acute osseous abnormality. Left glenohumeral joint osteoarthritis. IMPRESSION: 1. Decreasing inspiratory volumes with increasing bibasilar atelectasis. 2. Stable cardiomegaly. 3. Aortic atherosclerosis. Electronically Signed   By: Malachy Moan M.D.   On: 09/04/2017 08:18        Scheduled Meds: . acetaminophen  500 mg Oral Q6H  . apixaban  2.5 mg Oral BID  . Chlorhexidine Gluconate Cloth  6 each Topical Daily  . docusate sodium  100 mg Oral BID  . loratadine  10 mg Oral Daily  . metolazone  2.5 mg Oral Weekly  . metoprolol succinate  100 mg Oral Q breakfast  . mupirocin ointment  1 application Nasal BID  . pantoprazole  40 mg Oral Daily  . senna  1 tablet Oral Daily   Continuous Infusions: . lactated ringers Stopped (09/02/17 1514)  . methocarbamol (ROBAXIN)  IV       LOS: 4 days    Time spent: 35    Delaine Lame, MD [facility]   If 7PM-7AM, please contact night-coverage [email or web address] Password [email or web address] 09/05/2017, 10:06 AM

## 2017-09-05 NOTE — Progress Notes (Signed)
Physical Therapy Treatment Patient Details Name: Alyssa BranchRita Cameron MRN: [id number] DOB: 1918/10/19 Today's Date: 09/05/2017    History of Present Illness Pt is a 84 y/o female admitted following a fall at home, sustaining a R distal femur fracture. Pt is a NWB R LE with wound VAC application. PMH including but not limited to a-fib, CHF and HTN.    PT Comments    Pt showing improvements in bed mobilities, however continues to require max A +2 for transfers. She is unable to maintain NWB in standing. Provided pt with HEP for LE strengthening and ROM. Current d/c plan remains appropriate. Will continue to follow actually.     Follow Up Recommendations  SNF     Equipment Recommendations  None recommended by PT    Recommendations for Other Services       Precautions / Restrictions Precautions Precautions: Fall Restrictions Weight Bearing Restrictions: Yes RLE Weight Bearing: Non weight bearing Other Position/Activity Restrictions: pt HOH    Mobility  Bed Mobility Overal bed mobility: Needs Assistance Bed Mobility: Supine to Sit     Supine to sit: +2 for physical assistance;Mod assist     General bed mobility comments: cues for technique, increased time and effort required. Pt utalized bed rails with HOB elevated to come to sitting EOB.  Transfers Overall transfer level: Needs assistance Equipment used: Rolling walker (2 wheeled) Transfers: Sit to/from [employer]Stand;Stand Pivot Transfers Sit to Stand: From elevated surface;+2 physical assistance;Max assist Stand pivot transfers: Max assist;+2 physical assistance       General transfer comment: pt required increased time and effort, very fearful of falling during Transfer. Pt unable to maintain NWB status in standing. Will need additional assist to hold R LE off floor.  Ambulation/Gait                 Stairs             Wheelchair Mobility    Modified Rankin (Stroke Patients Only)       Balance Overall balance  assessment: Needs assistance;History of Falls Sitting-balance support: Bilateral upper extremity supported Sitting balance-Leahy Scale: Poor     Standing balance support: During functional activity;Bilateral upper extremity supported Standing balance-Leahy Scale: Poor Standing balance comment: Relies on external support.                             Cognition Arousal/Alertness: Awake/alert Behavior During Therapy: WFL for tasks assessed/performed Overall Cognitive Status: Impaired/Different from baseline Area of Impairment: Memory                               General Comments: pt HOH, making following commands challanging. Pt able to recall WB precautions, however demonstrated some short term memory deficits.      Exercises General Exercises - Lower Extremity Ankle Circles/Pumps: AROM;Both;10 reps;Seated Quad Sets: AROM;Both;5 reps;Seated Heel Slides: AROM;Both;5 reps;Seated Hip ABduction/ADduction: AROM;AAROM;Both;5 reps;Seated    General Comments General comments (skin integrity, edema, etc.): daughter present during session.      Pertinent Vitals/Pain Pain Assessment: Faces Faces Pain Scale: Hurts whole lot Pain Location: R hip Pain Descriptors / Indicators: Sore;Grimacing;Guarding Pain Intervention(s): Monitored during session;Limited activity within patient's tolerance;Repositioned    Home Living                      Prior Function  PT Goals (current goals can now be found in the care plan section) Acute Rehab PT Goals Patient Stated Goal: be more mobile PT Goal Formulation: With patient/family Time For Goal Achievement: 09/16/17 Potential to Achieve Goals: Fair Progress towards PT goals: Progressing toward goals    Frequency    Min 3X/week      PT Plan Current plan remains appropriate    Co-evaluation              AM-PAC PT "6 Clicks" Daily Activity  Outcome Measure  Difficulty turning over in  bed (including adjusting bedclothes, sheets and blankets)?: Unable Difficulty moving from lying on back to sitting on the side of the bed? : Unable Difficulty sitting down on and standing up from a chair with arms (e.g., wheelchair, bedside commode, etc,.)?: Unable Help needed moving to and from a bed to chair (including a wheelchair)?: A Lot Help needed walking in hospital room?: Total Help needed climbing 3-5 steps with a railing? : Total 6 Click Score: 7    End of Session Equipment Utilized During Treatment: Gait belt Activity Tolerance: Patient tolerated treatment well;Patient limited by pain Patient left: in chair;with call bell/phone within reach;with family/visitor present Nurse Communication: Mobility status PT Visit Diagnosis: Other abnormalities of gait and mobility (R26.89);Pain Pain - Right/Left: Right Pain - part of body: Leg     Time: 4098-1191 PT Time Calculation (min) (ACUTE ONLY): 31 min  Charges:  $Therapeutic Exercise: 8-22 mins $Therapeutic Activity: 8-22 mins                    G Codes:       Kallie Locks, [place] Pager [phone] Acute Rehab   Sheral Apley 09/05/2017, 2:08 PM

## 2017-09-06 ENCOUNTER — Other Ambulatory Visit: Payer: Self-pay | Admitting: Adult Health

## 2017-09-06 DIAGNOSIS — T148XXA Other injury of unspecified body region, initial encounter: Secondary | ICD-10-CM

## 2017-09-06 LAB — CBC
HCT: 27.4 % — ABNORMAL LOW (ref 36.0–46.0)
Hemoglobin: 8.2 g/dL — ABNORMAL LOW (ref 12.0–15.0)
MCH: 28.6 pg (ref 26.0–34.0)
MCHC: 29.9 g/dL — ABNORMAL LOW (ref 30.0–36.0)
MCV: 95.5 fL (ref 78.0–100.0)
Platelets: 137 K/uL — ABNORMAL LOW (ref 150–400)
RBC: 2.87 MIL/uL — ABNORMAL LOW (ref 3.87–5.11)
RDW: 18.1 % — ABNORMAL HIGH (ref 11.5–15.5)
WBC: 5.9 10*3/uL (ref 4.0–10.5)

## 2017-09-06 LAB — BASIC METABOLIC PANEL WITH GFR
BUN: 52 mg/dL — ABNORMAL HIGH (ref 8–23)
Calcium: 8.6 mg/dL — ABNORMAL LOW (ref 8.9–10.3)
Chloride: 102 mmol/L (ref 98–111)
Creatinine, Ser: 1.64 mg/dL — ABNORMAL HIGH (ref 0.44–1.00)
Glucose, Bld: 152 mg/dL — ABNORMAL HIGH (ref 70–99)

## 2017-09-06 LAB — CALCIUM, IONIZED: Calcium, Ionized, Serum: 4.7 mg/dL (ref 4.5–5.6)

## 2017-09-06 LAB — BASIC METABOLIC PANEL
Anion gap: 7 (ref 5–15)
CO2: 26 mmol/L (ref 22–32)
GFR calc Af Amer: 29 mL/min — ABNORMAL LOW (ref >60)
GFR calc non Af Amer: 25 mL/min — ABNORMAL LOW (ref >60)
Potassium: 4.4 mmol/L (ref 3.5–5.1)
Sodium: 135 mmol/L (ref 135–145)

## 2017-09-06 LAB — VITAMIN D 25 HYDROXY (VIT D DEFICIENCY, FRACTURES): VIT D 25 HYDROXY: 33.7 ng/mL (ref 30.0–100.0)

## 2017-09-06 LAB — CALCITRIOL (1,25 DI-OH VIT D): VIT D 1 25 DIHYDROXY: 17.1 pg/mL — AB (ref 19.9–79.3)

## 2017-09-06 MED ORDER — FUROSEMIDE 20 MG PO TABS: 20.0000 mg | ORAL_TABLET | Freq: Every day | ORAL | Status: AC

## 2017-09-06 MED ORDER — LISINOPRIL 20 MG PO TABS: 20.0000 mg | ORAL_TABLET | Freq: Every day | ORAL | Status: DC

## 2017-09-06 MED ORDER — METHOCARBAMOL 500 MG PO TABS: 500.0000 mg | ORAL_TABLET | Freq: Four times a day (QID) | ORAL | Status: AC | PRN

## 2017-09-06 MED ORDER — BISACODYL 10 MG RE SUPP
10.0000 mg | RECTAL | Status: DC | PRN
Start: 2017-09-06 — End: 2017-09-06
  Administered 2017-09-06: 10 mg via RECTAL
  Filled 2017-09-06: qty 1

## 2017-09-06 MED ORDER — DOCUSATE SODIUM 100 MG PO CAPS: 100.0000 mg | ORAL_CAPSULE | Freq: Two times a day (BID) | ORAL | 0 refills | Status: AC

## 2017-09-06 MED ORDER — HYDROCODONE-ACETAMINOPHEN 5-325 MG PO TABS: 1.0000 | ORAL_TABLET | Freq: Four times a day (QID) | ORAL | 0 refills | Status: AC | PRN

## 2017-09-06 NOTE — Clinical Social Work Placement (Signed)
   CLINICAL SOCIAL WORK PLACEMENT  NOTE  Date:  09/06/2017  Patient Details  Name: Alyssa Cameron MRN: [id number] Date of Birth: 12/07/1918  Clinical Social Work is seeking post-discharge placement for this patient at the Skilled  Nursing Facility level of care (*CSW will initial, date and re-position this form in  chart as items are completed):  Yes   Patient/family provided with [facility] Clinical Social Work Department's list of facilities offering this level of care within the geographic area requested by the patient (or if unable, by the patient's family).  Yes   Patient/family informed of their freedom to choose among providers that offer the needed level of care, that participate in Medicare, Medicaid or managed care program needed by the patient, have an available bed and are willing to accept the patient.  Yes   Patient/family informed of [facility]'s ownership interest in [facility] and [facility], as well as of the fact that they are under no obligation to receive care at these facilities.  PASRR submitted to EDS on       PASRR number received on 09/02/17     Existing PASRR number confirmed on       FL2 transmitted to all facilities in geographic area requested by pt/family on 09/02/17     FL2 transmitted to all facilities within larger geographic area on       Patient informed that his/her managed care company has contracts with or will negotiate with certain facilities, including the following:        Yes   Patient/family informed of bed offers received.  Patient chooses bed at [facility] [place]     Physician recommends and patient chooses bed at      Patient to be transferred to [facility] [place] on 09/04/17.  Patient to be transferred to facility by [facility]     Patient family notified on 09/06/17 of transfer.  Name of family member notified:  daughter Alyssa Cameron contacted     PHYSICIAN       Additional Comment:     _______________________________________________ Tresa MoorePatricia V Ario Mcdiarmid, LCSW 09/06/2017, 11:21 AM

## 2017-09-06 NOTE — Progress Notes (Signed)
Orthopedic Trauma Service Progress Note   Patient ID: Alyssa Cameron MRN: [id number] DOB/AGE: [birth year] 84 y.o.  Subjective:  Doing ok Ortho issues stable   C/o cyst on her head   ROS As above   Objective:   VITALS:   Vitals:   09/05/17 2039 09/06/17 0208 09/06/17 0500 09/06/17 0637  BP: 106/65  107/61   Pulse: 60  66   Resp: 18  15   Temp: 97.7 F (36.5 C)  97.6 F (36.4 C)   TempSrc: Oral  Oral   SpO2: 96% 95% 94%   Weight:   84.2 kg (185 lb 10 oz) 82.7 kg (182 lb 5.1 oz)    Estimated body mass index is 33.35 kg/m as calculated from the following:   Height as of 05/09/17: 5\' 2"  (1.575 m).   Weight as of this encounter: 82.7 kg (182 lb 5.1 oz).   Intake/Output      06/25 0701 - 06/26 0700 06/26 0701 - 06/27 0700   P.O. 720    Total Intake(mL/kg) 720 (8.7)    Urine (mL/kg/hr) 600 (0.3)    Total Output 600    Net +120         Urine Occurrence 4 x      LABS  Results for orders placed or performed during the hospital encounter of 08/31/17 (from the past 24 hour(s))  Basic metabolic panel     Status: Abnormal   Collection Time: 09/06/17  7:43 AM  Result Value Ref Range   Sodium 135 135 - 145 mmol/L   Potassium 4.4 3.5 - 5.1 mmol/L   Chloride 102 98 - 111 mmol/L   CO2 26 22 - 32 mmol/L   Glucose, Bld 152 (H) 70 - 99 mg/dL   BUN 52 (H) 8 - 23 mg/dL   Creatinine, Ser 3.08 (H) 0.44 - 1.00 mg/dL   Calcium 8.6 (L) 8.9 - 10.3 mg/dL   GFR calc non Af Amer 25 (L) >60 mL/min   GFR calc Af Amer 29 (L) >60 mL/min   Anion gap 7 5 - 15  CBC     Status: Abnormal   Collection Time: 09/06/17  7:43 AM  Result Value Ref Range   WBC 5.9 4.0 - 10.5 K/uL   RBC 2.87 (L) 3.87 - 5.11 MIL/uL   Hemoglobin 8.2 (L) 12.0 - 15.0 g/dL   HCT 65.7 (L) 84.6 - 96.2 %   MCV 95.5 78.0 - 100.0 fL   MCH 28.6 26.0 - 34.0 pg   MCHC 29.9 (L) 30.0 - 36.0 g/dL   RDW 95.2 (H) 84.1 - 32.4 %   Platelets 137 (L) 150 - 400 K/uL   Results for Alyssa, Cameron (MRN [id number]) as  of 09/06/2017 10:26  Ref. Range 09/05/2017 04:28  Vitamin D, 25-Hydroxy Latest Ref Range: 30.0 - 100.0 ng/mL 33.7    PHYSICAL EXAM:   Gen: resting comfortably in bed, NAD  Ext:       Right Lower Extremity   Dressings c/d/i             Extensive ecchymosis throughout R leg             Moderate knee effusion/hemarthrosis persists              Distal motor and sensory functions intact             Ext warm              + DP pulse  Swelling stable             Tolerating TED hose    Assessment/Plan: 5 Days Post-Op   Principal Problem:   Femur fracture, right (HCC) Active Problems:   Chronic atrial fibrillation (HCC)   Essential hypertension   CHF (congestive heart failure) (HCC)   CKD (chronic kidney disease), stage IV (HCC)   Anemia of chronic disease   Falls, initial encounter   Anti-infectives (From admission, onward)   Start     Dose/Rate Route Frequency Ordered Stop   09/02/17 0700  ceFAZolin (ANCEF) IVPB 1 g/50 mL premix     1 g 100 mL/hr over 30 Minutes Intravenous Every 12 hours 09/01/17 1941 09/02/17 1431   09/01/17 1900  ceFAZolin (ANCEF) IVPB 1 g/50 mL premix  Status:  Discontinued     1 g 100 mL/hr over 30 Minutes Intravenous Every 6 hours 09/01/17 1635 09/01/17 1941   09/01/17 1400  cefTRIAXone (ROCEPHIN) 1 g in sodium chloride 0.9 % 100 mL IVPB     1 g 200 mL/hr over 30 Minutes Intravenous  Once 09/01/17 1345 09/01/17 1354   09/01/17 0600  ceFAZolin (ANCEF) IVPB 2g/100 mL premix     2 g 200 mL/hr over 30 Minutes Intravenous To Short Stay 09/01/17 0301 09/01/17 1335    .  POD/HD#: 40  84 y/o female s/p fall with comminuted R distal femur fracture s/p ORIF    - fall   -R distal femur fracture s/p ORIF             NWB x 6-8 weeks             Unrestricted ROM R knee and ankle                         Do not let knee rest in flexion                          Place pillows under ankle to keep knee in full extension at rest             PT/OT              Ice and elevate             Dressing changes as needed                         Can clean wounds with soap and water only              TED hose during the day, ok to leave off at night                SNF    - Pain management:             Low dose narcotics              Scheduled tylenol    - ABL anemia/Hemodynamics             cbc stable   Renal function improved    - Medical issues              Per primary    - DVT/PE prophylaxis:             eliquis   - ID:              periop abx completed    -  Metabolic Bone Disease:             vitamin d levels are ok    - Activity:             NWB R leg             ROM as tolerated R knee    - FEN/GI prophylaxis/Foley/Lines:             Reg diet      - Dispo:             Continue with therapies              SW for snf placement    Mearl LatinKeith W. Elisavet Buehrer, PA-C [facility] [phone] (P) [phone] Traci Sermon(O) [phone] (C) 09/06/2017, 10:21 AM

## 2017-09-06 NOTE — Discharge Summary (Signed)
Physician Discharge Summary  Alyssa Cameron ZOX:[id number] DOB: 1918/07/12 DOA: 08/31/2017  PCP: Shirline Frees, NP  Admit date: 08/31/2017 Discharge date: 09/06/2017  Admitted From: Home  Disposition:  SNF  Discharge Condition:Stable  CODE STATUS:FULL  Diet recommendation: Heart Healthy   Brief/Interim Summary: 84 year old with past medical history relevant for hypertension, diastolic heart failure with EF of 65 to 70% on 12/2014, stage IV CKD, anemia due to CKD, atrial fibrillation on apixaban, stage IV chronic kidney disease who presented to the emergency department at [facility] after a fall and found to have a distal femur fracture on the right.  Patient was transferred to [facility] for surgical repair.  Patient underwent open reduction and internal fixation of right distal femur fracture on 09/01/2017 by orthopedics.  Patient also had AKI and chronic kidney disease and received some IV fluids with improvement in her renal function.  She did have some blood loss anemia from surgery and received a total of 3 units of packed RBC.  Her hemoglobin subsequently remained stable.  Patient does have history of chronic atrial for ablation and will be continued on apixaban and metoprolol on discharge.  Can remain compensated in terms of diastolic heart failure and will be resumed on her diuretics on discharge.  Patient seen the patient and recommend nonweightbearing to the right lower extremity for 6 to 8 weeks.  Will need to follow-up with orthopedics as outpatient in 7 to 10 days.  I also spoke with the patient's daughter at bedside regarding the plan for disposition to skilled nursing facility today.  Discharge Diagnoses:  Principal Problem:   Femur fracture, right (HCC) Active Problems:   Chronic atrial fibrillation (HCC)   Essential hypertension   CHF (congestive heart failure) (HCC)   CKD (chronic kidney disease), stage IV (HCC)   Anemia of chronic disease   Falls, initial encounter   Discharge  Instructions  Discharge Instructions    Diet - low sodium heart healthy   Complete by:  As directed    Increase activity slowly   Complete by:  As directed    Non weight bearing x 6-8 weeks Unrestricted ROM R knee and ankle Do not let knee rest in flexion  Place pillows under ankle to keep knee in full extension at rest Ice and elevate Dressing changes as needed Can clean wounds with soap and water only  TED hose during the day, ok to leave off at night   Non weight bearing   Complete by:  As directed    Laterality:  right   Extremity:  Lower     Allergies as of 09/06/2017      Reactions   Demerol [meperidine] Nausea Only   Vicodin [hydrocodone-acetaminophen] Nausea And Vomiting      Medication List    STOP taking these medications   CRANBERRY PO     TAKE these medications   acetaminophen 500 MG tablet Commonly known as:  TYLENOL Take 500-1,000 mg by mouth every 8 (eight) hours as needed for moderate pain.   apixaban 2.5 MG Tabs tablet Commonly known as:  ELIQUIS Take 1 tablet (2.5 mg total) by mouth 2 (two) times daily.   docusate sodium 100 MG capsule Commonly known as:  COLACE Take 1 capsule (100 mg total) by mouth 2 (two) times daily.   furosemide 20 MG tablet Commonly known as:  LASIX Take 1 tablet (20 mg total) by mouth daily. Take with the 40 mg tablet Start taking on:  09/08/2017 What changed:  These instructions start on 09/08/2017. If you are unsure what to do until then, ask your doctor or other care provider.  Another medication with the same name was removed. Continue taking this medication, and follow the directions you see here.   HYDROcodone-acetaminophen 5-325 MG tablet Commonly known as:  NORCO/VICODIN Take 1 tablet by mouth every 6 (six) hours as needed for moderate pain.   lisinopril 20 MG tablet Commonly  known as:  PRINIVIL,ZESTRIL Take 1 tablet (20 mg total) by mouth daily. Start taking on:  09/08/2017 What changed:  These instructions start on 09/08/2017. If you are unsure what to do until then, ask your doctor or other care provider.   methocarbamol 500 MG tablet Commonly known as:  ROBAXIN Take 1 tablet (500 mg total) by mouth every 6 (six) hours as needed for up to 5 days for muscle spasms.   metolazone 2.5 MG tablet Commonly known as:  ZAROXOLYN Take 1 tablet (2.5 mg total) by mouth once a week. Take weekly on tuesday   metoprolol succinate 100 MG 24 hr tablet Commonly known as:  TOPROL-XL TAKE 1 TABLET BY MOUTH  DAILY WITH BREAKFAST   ondansetron 4 MG tablet Commonly known as:  ZOFRAN Take 1 tablet (4 mg total) by mouth every 8 (eight) hours as needed for nausea or vomiting.   pantoprazole 40 MG tablet Commonly known as:  PROTONIX TAKE 1 TABLET BY MOUTH  DAILY   PARoxetine 10 MG tablet Commonly known as:  PAXIL Take 1 tablet (10 mg total) by mouth daily.   potassium chloride SA 20 MEQ tablet Commonly known as:  K-DUR,KLOR-CON TAKE 1 TABLET BY MOUTH  EVERY DAY   traZODone 50 MG tablet Commonly known as:  DESYREL TAKE 0.5-1 TABLETS (25-50 MG TOTAL) BY MOUTH AT BEDTIME AS NEEDED FOR SLEEP. What changed:  how much to take            Discharge Care Instructions  (From admission, onward)        Start     Ordered   09/06/17 0000  Non weight bearing    Question Answer Comment  Laterality right   Extremity Lower      09/06/17 1031     Follow-up Information    Myrene GalasHandy, Michael, MD. Schedule an appointment as soon as possible for a visit in 10 day(s).   Specialty:  Orthopedic Surgery Contact information: [address] [place] [id number] [phone]          Allergies  Allergen Reactions  . Demerol [Meperidine] Nausea Only  . Vicodin [Hydrocodone-Acetaminophen] Nausea And Vomiting     Consultations:  Orthopedics   Procedures/Studies: Open reduction and internal fixation of right femur fracture on 09/01/2017   Subjective: She denies overt pain.  Denies chest pain shortness of breath fever or cough.  She is hard of hearing.  Discharge Exam: Vitals:   09/06/17 0208 09/06/17 0500  BP:  107/61  Pulse:  66  Resp:  15  Temp:  97.6 F (36.4 C)  SpO2: 95% 94%   Vitals:   09/05/17 2039 09/06/17 0208 09/06/17 0500 09/06/17 0637  BP: 106/65  107/61   Pulse: 60  66   Resp: 18  15   Temp: 97.7 F (36.5 C)  97.6 F (36.4 C)   TempSrc: Oral  Oral   SpO2: 96% 95% 94%   Weight:   84.2 kg (185 lb 10 oz) 82.7 kg (182 lb 5.1 oz)    General: Pt is  alert, awake, not in acute distress  Cardiovascular: Irregularly irregular rhythm, S1/S2 +, no rubs, no gallops Respiratory: CTA bilaterally, no wheezing, no rhonchi Abdominal: Soft, NT, ND, bowel sounds + Extremities: Bruise noted over the pubic area and right thigh.  Surgical incision clean dry.  Distal pulses are palpable. Skin with bruise as described above.    The results of significant diagnostics from this hospitalization (including imaging, microbiology, ancillary and laboratory) are listed below for reference.     Microbiology: Recent Results (from the past 240 hour(s))  Surgical pcr screen     Status: Abnormal   Collection Time: 09/01/17  3:59 AM  Result Value Ref Range Status   MRSA, PCR NEGATIVE NEGATIVE Final   Staphylococcus aureus POSITIVE (A) NEGATIVE Final    Comment: (NOTE) The Xpert SA Assay (FDA approved for NASAL specimens in patients 46 years of age and older), is one component of a comprehensive surveillance program. It is not intended to diagnose infection nor to guide or monitor treatment. Performed at [facility], [address] [place], [place] [address]      Labs: BNP (last 3 results) Recent Labs    08/31/17 2307  BNP 472.5*   Basic Metabolic Panel: Recent Labs   Lab 09/02/17 0347 09/03/17 0415 09/04/17 0403 09/05/17 0428 09/06/17 0743  NA 140 134* 132* 135 135  K 4.6 4.4 4.5 4.3 4.4  CL 105 101 98* 99 102  CO2 27 24 23 25 26   GLUCOSE 171* 165* 134* 177* 152*  BUN 43* 59* 66* 58* 52*  CREATININE 1.68* 2.52* 2.58* 2.21* 1.64*  CALCIUM 9.1 8.8* 8.8* 8.8* 8.6*  MG 1.9 2.0  --   --   --   PHOS  --   --   --  3.4  --    Liver Function Tests: Recent Labs  Lab 09/05/17 0428  ALBUMIN 3.0*   No results for input(s): LIPASE, AMYLASE in the last 168 hours. No results for input(s): AMMONIA in the last 168 hours. CBC: Recent Labs  Lab 08/31/17 2307  09/03/17 0415 09/04/17 0403 09/04/17 1150 09/05/17 0428 09/06/17 0743  WBC 6.7   < > 11.3* 10.6* 8.8 7.5 5.9  NEUTROABS 5.0  --   --   --   --   --   --   HGB 9.3*   < > 7.6* 7.9* 7.7* 8.9* 8.2*  HCT 30.8*   < > 25.1* 26.4* 25.4* 28.9* 27.4*  MCV 94.2   < > 93.3 95.7 96.6 93.8 95.5  PLT 234   < > 180 195 184 165 137*   < > = values in this interval not displayed.   Cardiac Enzymes: No results for input(s): CKTOTAL, CKMB, CKMBINDEX, TROPONINI in the last 168 hours. BNP: Invalid input(s): POCBNP CBG: No results for input(s): GLUCAP in the last 168 hours. D-Dimer No results for input(s): DDIMER in the last 72 hours. Hgb A1c No results for input(s): HGBA1C in the last 72 hours. Lipid Profile No results for input(s): CHOL, HDL, LDLCALC, TRIG, CHOLHDL, LDLDIRECT in the last 72 hours. Thyroid function studies No results for input(s): TSH, T4TOTAL, T3FREE, THYROIDAB in the last 72 hours.  Invalid input(s): FREET3 Anemia work up No results for input(s): VITAMINB12, FOLATE, FERRITIN, TIBC, IRON, RETICCTPCT in the last 72 hours. Urinalysis    Component Value Date/Time   COLORURINE YELLOW 09/01/2017 0450   APPEARANCEUR CLOUDY (A) 09/01/2017 0450   LABSPEC 1.009 09/01/2017 0450   PHURINE 5.0 09/01/2017 0450  GLUCOSEU NEGATIVE 09/01/2017 0450   HGBUR MODERATE (A) 09/01/2017 0450    BILIRUBINUR NEGATIVE 09/01/2017 0450   BILIRUBINUR n 10/20/2014 1409   KETONESUR NEGATIVE 09/01/2017 0450   PROTEINUR NEGATIVE 09/01/2017 0450   UROBILINOGEN 0.2 10/20/2014 1409   NITRITE NEGATIVE 09/01/2017 0450   LEUKOCYTESUR LARGE (A) 09/01/2017 0450   Sepsis Labs Invalid input(s): PROCALCITONIN,  WBC,  LACTICIDVEN Microbiology Recent Results (from the past 240 hour(s))  Surgical pcr screen     Status: Abnormal   Collection Time: 09/01/17  3:59 AM  Result Value Ref Range Status   MRSA, PCR NEGATIVE NEGATIVE Final   Staphylococcus aureus POSITIVE (A) NEGATIVE Final    Comment: (NOTE) The Xpert SA Assay (FDA approved for NASAL specimens in patients 52 years of age and older), is one component of a comprehensive surveillance program. It is not intended to diagnose infection nor to guide or monitor treatment. Performed at [facility], [address] [place], [place] [address]     Please note: You were cared for by a hospitalist during your hospital stay. Once you are discharged, your primary care physician will handle any further medical issues. Please note that NO REFILLS for any discharge medications will be authorized once you are discharged, as it is imperative that you return to your primary care physician (or establish a relationship with a primary care physician if you do not have one) for your post hospital discharge needs so that they can reassess your need for medications and monitor your lab values.    Time coordinating discharge: 40 minutes  SIGNED:   Joycelyn Das, MD  [facility] 09/06/2017, 11:03 AM Pager [phone]  If 7PM-7AM, please contact night-coverage [email or web address] Password [email or web address]

## 2017-09-06 NOTE — Social Work (Signed)
Clinical Social Worker facilitated patient discharge including contacting patient family and facility to confirm patient discharge plans.  Clinical information faxed to facility and family agreeable with plan.    CSW arranged ambulance transport via [facility] to [facility].    RN to call [phone] to give report prior to discharge.  Clinical Social Worker will sign off for now as social work intervention is no longer needed. Please consult us again if new need arises.  Margree Gimbel, LCSW Clinical Social Worker [phone]    

## 2017-09-06 NOTE — Progress Notes (Signed)
Discharged pt to SNF( [facility]) via [facility]. [facility] [place] [facility]

## 2017-09-07 ENCOUNTER — Ambulatory Visit: Payer: Medicare Other | Admitting: Cardiovascular Disease

## 2017-10-01 ENCOUNTER — Other Ambulatory Visit: Payer: Self-pay | Admitting: Adult Health

## 2017-10-01 ENCOUNTER — Other Ambulatory Visit: Payer: Self-pay | Admitting: Cardiovascular Disease

## 2017-10-02 NOTE — Telephone Encounter (Signed)
Last OV 08/03/2017   Last refilled 01/11/2017 disp 90 with 1 refill  Sent to PCP for approval

## 2018-01-02 ENCOUNTER — Other Ambulatory Visit: Payer: Self-pay

## 2018-01-02 ENCOUNTER — Inpatient Hospital Stay (HOSPITAL_COMMUNITY)
Admission: EM | Admit: 2018-01-02 | Discharge: 2018-01-08 | DRG: 291 | Disposition: A | Discharge status: Skilled Nursing Facility | Payer: Medicare Other | Attending: Internal Medicine | Admitting: Internal Medicine

## 2018-01-02 ENCOUNTER — Emergency Department (HOSPITAL_COMMUNITY): Payer: Medicare Other

## 2018-01-02 ENCOUNTER — Encounter (HOSPITAL_COMMUNITY): Payer: Self-pay | Admitting: Emergency Medicine

## 2018-01-02 DIAGNOSIS — I5031 Acute diastolic (congestive) heart failure: Secondary | ICD-10-CM

## 2018-01-02 DIAGNOSIS — W19XXXA Unspecified fall, initial encounter: Secondary | ICD-10-CM | POA: Diagnosis present

## 2018-01-02 DIAGNOSIS — K7460 Unspecified cirrhosis of liver: Secondary | ICD-10-CM | POA: Diagnosis present

## 2018-01-02 DIAGNOSIS — Z515 Encounter for palliative care: Secondary | ICD-10-CM

## 2018-01-02 DIAGNOSIS — Z09 Encounter for follow-up examination after completed treatment for conditions other than malignant neoplasm: Secondary | ICD-10-CM

## 2018-01-02 DIAGNOSIS — I130 Hypertensive heart and chronic kidney disease with heart failure and stage 1 through stage 4 chronic kidney disease, or unspecified chronic kidney disease: Secondary | ICD-10-CM | POA: Diagnosis not present

## 2018-01-02 DIAGNOSIS — K219 Gastro-esophageal reflux disease without esophagitis: Secondary | ICD-10-CM

## 2018-01-02 DIAGNOSIS — I4891 Unspecified atrial fibrillation: Secondary | ICD-10-CM

## 2018-01-02 DIAGNOSIS — J9601 Acute respiratory failure with hypoxia: Secondary | ICD-10-CM

## 2018-01-02 DIAGNOSIS — M1990 Unspecified osteoarthritis, unspecified site: Secondary | ICD-10-CM | POA: Diagnosis present

## 2018-01-02 DIAGNOSIS — D649 Anemia, unspecified: Secondary | ICD-10-CM

## 2018-01-02 DIAGNOSIS — S301XXA Contusion of abdominal wall, initial encounter: Secondary | ICD-10-CM

## 2018-01-02 DIAGNOSIS — M109 Gout, unspecified: Secondary | ICD-10-CM

## 2018-01-02 DIAGNOSIS — Z7982 Long term (current) use of aspirin: Secondary | ICD-10-CM

## 2018-01-02 DIAGNOSIS — K5730 Diverticulosis of large intestine without perforation or abscess without bleeding: Secondary | ICD-10-CM | POA: Diagnosis present

## 2018-01-02 DIAGNOSIS — Z9049 Acquired absence of other specified parts of digestive tract: Secondary | ICD-10-CM

## 2018-01-02 DIAGNOSIS — R748 Abnormal levels of other serum enzymes: Secondary | ICD-10-CM

## 2018-01-02 DIAGNOSIS — N183 Chronic kidney disease, stage 3 unspecified: Secondary | ICD-10-CM

## 2018-01-02 DIAGNOSIS — E876 Hypokalemia: Secondary | ICD-10-CM | POA: Diagnosis not present

## 2018-01-02 DIAGNOSIS — D62 Acute posthemorrhagic anemia: Secondary | ICD-10-CM | POA: Diagnosis present

## 2018-01-02 DIAGNOSIS — D631 Anemia in chronic kidney disease: Secondary | ICD-10-CM | POA: Diagnosis present

## 2018-01-02 DIAGNOSIS — Z993 Dependence on wheelchair: Secondary | ICD-10-CM

## 2018-01-02 DIAGNOSIS — G9340 Encephalopathy, unspecified: Secondary | ICD-10-CM

## 2018-01-02 DIAGNOSIS — I272 Pulmonary hypertension, unspecified: Secondary | ICD-10-CM | POA: Diagnosis present

## 2018-01-02 DIAGNOSIS — J810 Acute pulmonary edema: Secondary | ICD-10-CM | POA: Diagnosis not present

## 2018-01-02 DIAGNOSIS — F329 Major depressive disorder, single episode, unspecified: Secondary | ICD-10-CM | POA: Diagnosis present

## 2018-01-02 DIAGNOSIS — Z7189 Other specified counseling: Secondary | ICD-10-CM

## 2018-01-02 DIAGNOSIS — R6510 Systemic inflammatory response syndrome (SIRS) of non-infectious origin without acute organ dysfunction: Secondary | ICD-10-CM | POA: Diagnosis present

## 2018-01-02 DIAGNOSIS — Z79899 Other long term (current) drug therapy: Secondary | ICD-10-CM

## 2018-01-02 DIAGNOSIS — R509 Fever, unspecified: Secondary | ICD-10-CM

## 2018-01-02 DIAGNOSIS — F411 Generalized anxiety disorder: Secondary | ICD-10-CM

## 2018-01-02 DIAGNOSIS — Z66 Do not resuscitate: Secondary | ICD-10-CM | POA: Diagnosis present

## 2018-01-02 DIAGNOSIS — Z79891 Long term (current) use of opiate analgesic: Secondary | ICD-10-CM

## 2018-01-02 DIAGNOSIS — Z8719 Personal history of other diseases of the digestive system: Secondary | ICD-10-CM

## 2018-01-02 DIAGNOSIS — I5043 Acute on chronic combined systolic (congestive) and diastolic (congestive) heart failure: Secondary | ICD-10-CM | POA: Diagnosis present

## 2018-01-02 DIAGNOSIS — G9341 Metabolic encephalopathy: Secondary | ICD-10-CM | POA: Diagnosis present

## 2018-01-02 DIAGNOSIS — I10 Essential (primary) hypertension: Secondary | ICD-10-CM

## 2018-01-02 DIAGNOSIS — Z885 Allergy status to narcotic agent status: Secondary | ICD-10-CM

## 2018-01-02 DIAGNOSIS — F32A Depression, unspecified: Secondary | ICD-10-CM

## 2018-01-02 DIAGNOSIS — Z87440 Personal history of urinary (tract) infections: Secondary | ICD-10-CM

## 2018-01-02 LAB — COMPREHENSIVE METABOLIC PANEL
ALT: 13 U/L (ref 0–44)
ANION GAP: 7 (ref 5–15)
AST: 17 U/L (ref 15–41)
Albumin: 2.7 g/dL — ABNORMAL LOW (ref 3.5–5.0)
Alkaline Phosphatase: 143 U/L — ABNORMAL HIGH (ref 38–126)
BILIRUBIN TOTAL: 1.1 mg/dL (ref 0.3–1.2)
BUN: 30 mg/dL — ABNORMAL HIGH (ref 8–23)
CHLORIDE: 102 mmol/L (ref 98–111)
CO2: 25 mmol/L (ref 22–32)
Calcium: 8.8 mg/dL — ABNORMAL LOW (ref 8.9–10.3)
Creatinine, Ser: 1.09 mg/dL — ABNORMAL HIGH (ref 0.44–1.00)
GFR, EST AFRICAN AMERICAN: 48 mL/min — AB (ref >60)
GFR, EST NON AFRICAN AMERICAN: 41 mL/min — AB (ref >60)
Glucose, Bld: 157 mg/dL — ABNORMAL HIGH (ref 70–99)
POTASSIUM: 3.7 mmol/L (ref 3.5–5.1)
Sodium: 134 mmol/L — ABNORMAL LOW (ref 135–145)
TOTAL PROTEIN: 5.2 g/dL — AB (ref 6.5–8.1)

## 2018-01-02 LAB — CBC WITH DIFFERENTIAL/PLATELET
Abs Immature Granulocytes: 0.09 10*3/uL — ABNORMAL HIGH (ref 0.00–0.07)
BASOS PCT: 0 %
Basophils Absolute: 0 10*3/uL (ref 0.0–0.1)
EOS ABS: 0.1 10*3/uL (ref 0.0–0.5)
EOS PCT: 1 %
HCT: 24.5 % — ABNORMAL LOW (ref 36.0–46.0)
Hemoglobin: 7.6 g/dL — ABNORMAL LOW (ref 12.0–15.0)
IMMATURE GRANULOCYTES: 1 %
Lymphocytes Relative: 7 %
Lymphs Abs: 0.7 10*3/uL (ref 0.7–4.0)
MCH: 31.5 pg (ref 26.0–34.0)
MCHC: 31 g/dL (ref 30.0–36.0)
MCV: 101.7 fL — AB (ref 80.0–100.0)
MONOS PCT: 10 %
Monocytes Absolute: 1 10*3/uL (ref 0.1–1.0)
NEUTROS ABS: 8.4 10*3/uL — AB (ref 1.7–7.7)
Neutrophils Relative %: 81 %
PLATELETS: 215 10*3/uL (ref 150–400)
RBC: 2.41 MIL/uL — ABNORMAL LOW (ref 3.87–5.11)
RDW: 15.3 % (ref 11.5–15.5)
WBC: 10.3 10*3/uL (ref 4.0–10.5)
nRBC: 0 % (ref 0.0–0.2)

## 2018-01-02 LAB — I-STAT TROPONIN, ED: TROPONIN I, POC: 0.05 ng/mL (ref 0.00–0.08)

## 2018-01-02 LAB — I-STAT CHEM 8, ED
BUN: 30 mg/dL — ABNORMAL HIGH (ref 8–23)
CALCIUM ION: 1.19 mmol/L (ref 1.15–1.40)
Chloride: 99 mmol/L (ref 98–111)
Creatinine, Ser: 1.1 mg/dL — ABNORMAL HIGH (ref 0.44–1.00)
GLUCOSE: 155 mg/dL — AB (ref 70–99)
HCT: 24 % — ABNORMAL LOW (ref 36.0–46.0)
Hemoglobin: 8.2 g/dL — ABNORMAL LOW (ref 12.0–15.0)
Potassium: 3.7 mmol/L (ref 3.5–5.1)
SODIUM: 136 mmol/L (ref 135–145)
TCO2: 29 mmol/L (ref 22–32)

## 2018-01-02 LAB — PROTIME-INR
INR: 1.55
PROTHROMBIN TIME: 18.4 s — AB (ref 11.4–15.2)

## 2018-01-02 LAB — I-STAT CG4 LACTIC ACID, ED: LACTIC ACID, VENOUS: 1.22 mmol/L (ref 0.5–1.9)

## 2018-01-02 MED ORDER — SODIUM CHLORIDE 0.9 % IV BOLUS: 500.0000 mL | Freq: Once | INTRAVENOUS | Status: DC

## 2018-01-02 MED ORDER — IOPAMIDOL (ISOVUE-370) INJECTION 76%
80.0000 mL | Freq: Once | INTRAVENOUS | Status: AC | PRN
  Administered 2018-01-02: 80 mL via INTRAVENOUS

## 2018-01-02 NOTE — ED Triage Notes (Signed)
Pt to ED via [facility] from [facility].  Pt was recently dx with pneumonia.  Pt was sent to ED tonight ref.  Rapid heart rate.  Pt also has a lg reddened area on left mid abd and flank area    CBG 187

## 2018-01-02 NOTE — ED Provider Notes (Signed)
TIME SEEN: 11:25 PM  CHIEF COMPLAINT: Confusion, cough, bruising to the left flank  HPI: Patient is a 84 year old female with history of hypertension, CHF, atrial fibrillation on Eliquis who [facility] with cough, congestion, low-grade fevers for the past 4 days.  Was placed on Levaquin on 12/30/2017 for possible pneumonia.  Nursing facility noted that patient was tachycardic tonight, seemed confused over the past 24 hours and then noticed a large area of bruising to her left flank.  No known fall.  Does not wear oxygen at the nursing facility.  Daughter at bedside provides most of the history.  ROS: Level 5 caveat for altered mental status  PAST MEDICAL HISTORY/PAST SURGICAL HISTORY:  Past Medical History:  Diagnosis Date  . A-fib (HCC)   . Arthritis   . CHF (congestive heart failure) (HCC)   . Diverticulitis   . Frequent UTI   . History of colon polyps   . Hx of colonic polyps    2013 - 2 tubular adenoma sigmoid and Transverse polyp  . Hypertension   . Internal hemorrhoids   . Pulmonary hypertension (HCC) 01/07/2015  . Spinal stenosis   . Urine incontinence     MEDICATIONS:  Prior to Admission medications   Medication Sig Start Date End Date Taking? Authorizing Provider  acetaminophen (TYLENOL) 500 MG tablet Take 500-1,000 mg by mouth every 8 (eight) hours as needed for moderate pain.     [provider]  apixaban (ELIQUIS) 2.5 MG TABS tablet Take 1 tablet (2.5 mg total) by mouth 2 (two) times daily. 03/08/17   Chilton Si, MD  docusate sodium (COLACE) 100 MG capsule Take 1 capsule (100 mg total) by mouth 2 (two) times daily. 09/06/17   Pokhrel, Rebekah Chesterfield, MD  furosemide (LASIX) 20 MG tablet Take 1 tablet (20 mg total) by mouth daily. Take with the 40 mg tablet 09/08/17   Pokhrel, Rebekah Chesterfield, MD  HYDROcodone-acetaminophen (NORCO/VICODIN) 5-325 MG tablet Take 1 tablet by mouth every 6 (six) hours as needed for moderate pain. 09/06/17    Pokhrel, Rebekah Chesterfield, MD  lisinopril (PRINIVIL,ZESTRIL) 20 MG tablet Take 1 tablet (20 mg total) by mouth daily. 09/08/17   Pokhrel, Laxman, MD  metolazone (ZAROXOLYN) 2.5 MG tablet TAKE 1 TABLET (2.5 MG TOTAL) BY MOUTH ONCE A WEEK. TAKE WEEKLY ON TUESDAY 09/07/17 12/06/17  Chilton Si, MD  metoprolol succinate (TOPROL-XL) 100 MG 24 hr tablet TAKE 1 TABLET BY MOUTH  DAILY WITH BREAKFAST 06/12/17   Azalee Course, PA  ondansetron (ZOFRAN) 4 MG tablet Take 1 tablet (4 mg total) by mouth every 8 (eight) hours as needed for nausea or vomiting. 08/01/17   Nafziger, Kandee Keen, NP  pantoprazole (PROTONIX) 40 MG tablet TAKE 1 TABLET BY MOUTH  DAILY 06/13/17   Nafziger, Kandee Keen, NP  PARoxetine (PAXIL) 10 MG tablet Take 1 tablet (10 mg total) by mouth daily. Patient not taking: Reported on 09/01/2017 07/28/17   Shirline Frees, NP  potassium chloride SA (K-DUR,KLOR-CON) 20 MEQ tablet TAKE 1 TABLET BY MOUTH  EVERY DAY 10/03/17   Nafziger, Kandee Keen, NP  traZODone (DESYREL) 50 MG tablet TAKE 0.5-1 TABLETS (25-50 MG TOTAL) BY MOUTH AT BEDTIME AS NEEDED FOR SLEEP. Patient taking differently: Take 25 mg by mouth at bedtime as needed for sleep.  06/07/17   Shirline Frees, NP    ALLERGIES:  Allergies  Allergen Reactions  . Demerol [Meperidine] Nausea Only  . Vicodin [Hydrocodone-Acetaminophen] Nausea And Vomiting    SOCIAL HISTORY:  Social History  Tobacco Use  . Smoking status: Never Smoker  . Smokeless tobacco: Never Used  Substance Use Topics  . Alcohol use: No    Alcohol/week: 0.0 standard drinks    FAMILY HISTORY: Family History  Problem Relation Age of Onset  . Heart failure Mother        14  . Heart failure Father        67  . Breast cancer Unknown   . Lung cancer Unknown   . Stroke Unknown     EXAM: BP (!) 104/59   Pulse (!) 129   Temp 97.8 F (36.6 C) (Oral)   Resp (!) 29   SpO2 98%  CONSTITUTIONAL: Alert but disoriented and unable to answer questions appropriately HEAD: Normocephalic EYES:  Conjunctivae clear, pupils appear equal, EOMI ENT: normal nose; moist mucous membranes NECK: Supple, no meningismus, no nuchal rigidity, no LAD  CARD: Irregularly irregular and tachycardic; S1 and S2 appreciated; no murmurs, no clicks, no rubs, no gallops RESP: Normal chest excursion without splinting, patient is tachypnea, bibasilar crackles on exam, no rhonchi or wheezing, no hypoxia on nasal cannula ABD/GI: Normal bowel sounds; non-distended; soft, tender throughout the left abdomen, no rebound, no guarding, no peritoneal signs, no hepatosplenomegaly, ecchymosis noted to the left flank BACK: Patient has ecchymosis to the left flank, no midline spinal tenderness or step-off or deformity EXT: Normal ROM in all joints; non-tender to palpation; no edema; normal capillary refill; no cyanosis, no calf tenderness or swelling    SKIN: Normal color for age and race; warm; no rash NEURO: Moves all extremities equally PSYCH: The patient's mood and manner are appropriate. Grooming and personal hygiene are appropriate.  MEDICAL DECISION MAKING: Patient here with atrial fibrillation with RVR, cough, confusion and now bruising to the left flank.  I am concerned that she could have a retroperitoneal hematoma, possible dissection with active bleeding.  Will obtain CTA of the chest, abdomen and pelvis emergently.  I am also concerned for possible sepsis.  Will obtain labs, rectal temperature, urine.  I feel patient will likely need admission.  ED PROGRESS: Patient's labs show hemoglobin of 7.6 which appears to be near her baseline.  Her lactate is normal.  Troponin is negative.  CT scan shows a moderate to large left rectus sheath hematoma without evidence of active extravasation.  She has small to moderate bilateral pleural effusions with groundglass opacities concerning for pulmonary edema.  She has bladder wall thickening concerning for possible UTI.  Urine pending.  Will add on BNP.  Will transfuse 2 units of  packed red blood cells.  Patient has been found to be febrile here.  Will hold on IV fluids given she has no hypotension and a normal lactate and concern for possible volume overload.  We will treat her tachycardia with diltiazem.  Will give Tylenol for fever.  Will discuss with medicine for admission.  PCP is Dr. Kevan Ny with [facility] physicians.  2:18 AM Discussed patient's case with hospitalist, Dr. Loney Loh.  I have recommended admission and patient (and family if present) agree with this plan. Admitting physician will place admission orders.   I reviewed all nursing notes, vitals, pertinent previous records, EKGs, lab and urine results, imaging (as available).    EKG Interpretation  Date/Time:  Tuesday January 02 2018 22:41:55 EDT Ventricular Rate:  135 PR Interval:    QRS Duration: 82 QT Interval:  313 QTC Calculation: 470 R Axis:   125 Text Interpretation:  Atrial fibrillation Right axis deviation Low voltage,  extremity and precordial leads Abnormal R-wave progression, late transition Abnormal lateral Q waves Minimal ST depression, inferior leads aside from fast HR, no significant change Confirmed by Marily Memos [phone]) on 01/02/2018 10:50:47 PM        CRITICAL CARE Performed by: Baxter Hire Beatrix Breece   Total critical care time: 55 minutes  Critical care time was exclusive of separately billable procedures and treating other patients.  Critical care was necessary to treat or prevent imminent or life-threatening deterioration.  Critical care was time spent personally by me on the following activities: development of treatment plan with patient and/or surrogate as well as nursing, discussions with consultants, evaluation of patient's response to treatment, examination of patient, obtaining history from patient or surrogate, ordering and performing treatments and interventions, ordering and review of laboratory studies, ordering and review of radiographic studies, pulse oximetry and  re-evaluation of patient's condition.    Verona Hartshorn, Layla Maw, DO 01/03/18 [phone]

## 2018-01-03 ENCOUNTER — Other Ambulatory Visit: Payer: Self-pay

## 2018-01-03 ENCOUNTER — Inpatient Hospital Stay (HOSPITAL_COMMUNITY): Payer: Medicare Other

## 2018-01-03 DIAGNOSIS — Z66 Do not resuscitate: Secondary | ICD-10-CM | POA: Diagnosis present

## 2018-01-03 DIAGNOSIS — G9340 Encephalopathy, unspecified: Secondary | ICD-10-CM

## 2018-01-03 DIAGNOSIS — K219 Gastro-esophageal reflux disease without esophagitis: Secondary | ICD-10-CM | POA: Diagnosis present

## 2018-01-03 DIAGNOSIS — K5730 Diverticulosis of large intestine without perforation or abscess without bleeding: Secondary | ICD-10-CM | POA: Diagnosis present

## 2018-01-03 DIAGNOSIS — I361 Nonrheumatic tricuspid (valve) insufficiency: Secondary | ICD-10-CM

## 2018-01-03 DIAGNOSIS — W19XXXA Unspecified fall, initial encounter: Secondary | ICD-10-CM | POA: Diagnosis present

## 2018-01-03 DIAGNOSIS — R6510 Systemic inflammatory response syndrome (SIRS) of non-infectious origin without acute organ dysfunction: Secondary | ICD-10-CM | POA: Diagnosis present

## 2018-01-03 DIAGNOSIS — I340 Nonrheumatic mitral (valve) insufficiency: Secondary | ICD-10-CM | POA: Diagnosis not present

## 2018-01-03 DIAGNOSIS — D62 Acute posthemorrhagic anemia: Secondary | ICD-10-CM | POA: Diagnosis present

## 2018-01-03 DIAGNOSIS — Z7982 Long term (current) use of aspirin: Secondary | ICD-10-CM | POA: Diagnosis not present

## 2018-01-03 DIAGNOSIS — N183 Chronic kidney disease, stage 3 unspecified: Secondary | ICD-10-CM

## 2018-01-03 DIAGNOSIS — I272 Pulmonary hypertension, unspecified: Secondary | ICD-10-CM | POA: Diagnosis present

## 2018-01-03 DIAGNOSIS — J9601 Acute respiratory failure with hypoxia: Secondary | ICD-10-CM | POA: Diagnosis present

## 2018-01-03 DIAGNOSIS — G9341 Metabolic encephalopathy: Secondary | ICD-10-CM | POA: Diagnosis present

## 2018-01-03 DIAGNOSIS — F329 Major depressive disorder, single episode, unspecified: Secondary | ICD-10-CM

## 2018-01-03 DIAGNOSIS — I5031 Acute diastolic (congestive) heart failure: Secondary | ICD-10-CM

## 2018-01-03 DIAGNOSIS — M1990 Unspecified osteoarthritis, unspecified site: Secondary | ICD-10-CM | POA: Diagnosis present

## 2018-01-03 DIAGNOSIS — M109 Gout, unspecified: Secondary | ICD-10-CM

## 2018-01-03 DIAGNOSIS — I4891 Unspecified atrial fibrillation: Secondary | ICD-10-CM | POA: Diagnosis present

## 2018-01-03 DIAGNOSIS — Z7189 Other specified counseling: Secondary | ICD-10-CM | POA: Diagnosis not present

## 2018-01-03 DIAGNOSIS — Z8719 Personal history of other diseases of the digestive system: Secondary | ICD-10-CM | POA: Diagnosis not present

## 2018-01-03 DIAGNOSIS — I130 Hypertensive heart and chronic kidney disease with heart failure and stage 1 through stage 4 chronic kidney disease, or unspecified chronic kidney disease: Secondary | ICD-10-CM | POA: Diagnosis present

## 2018-01-03 DIAGNOSIS — D649 Anemia, unspecified: Secondary | ICD-10-CM

## 2018-01-03 DIAGNOSIS — I5043 Acute on chronic combined systolic (congestive) and diastolic (congestive) heart failure: Secondary | ICD-10-CM | POA: Diagnosis present

## 2018-01-03 DIAGNOSIS — R748 Abnormal levels of other serum enzymes: Secondary | ICD-10-CM | POA: Diagnosis present

## 2018-01-03 DIAGNOSIS — K7460 Unspecified cirrhosis of liver: Secondary | ICD-10-CM | POA: Diagnosis present

## 2018-01-03 DIAGNOSIS — D631 Anemia in chronic kidney disease: Secondary | ICD-10-CM | POA: Diagnosis present

## 2018-01-03 DIAGNOSIS — F411 Generalized anxiety disorder: Secondary | ICD-10-CM | POA: Diagnosis present

## 2018-01-03 DIAGNOSIS — Z79899 Other long term (current) drug therapy: Secondary | ICD-10-CM | POA: Diagnosis not present

## 2018-01-03 DIAGNOSIS — S301XXA Contusion of abdominal wall, initial encounter: Secondary | ICD-10-CM

## 2018-01-03 DIAGNOSIS — J810 Acute pulmonary edema: Secondary | ICD-10-CM | POA: Diagnosis present

## 2018-01-03 DIAGNOSIS — F32A Depression, unspecified: Secondary | ICD-10-CM

## 2018-01-03 DIAGNOSIS — Z515 Encounter for palliative care: Secondary | ICD-10-CM | POA: Diagnosis present

## 2018-01-03 LAB — URINALYSIS, ROUTINE W REFLEX MICROSCOPIC
BILIRUBIN URINE: NEGATIVE
Bacteria, UA: NONE SEEN
Glucose, UA: NEGATIVE mg/dL
Ketones, ur: NEGATIVE mg/dL
NITRITE: NEGATIVE
PROTEIN: NEGATIVE mg/dL
Specific Gravity, Urine: 1.027 (ref 1.005–1.030)
pH: 5 (ref 5.0–8.0)

## 2018-01-03 LAB — INFLUENZA PANEL BY PCR (TYPE A & B)
Influenza A By PCR: NEGATIVE
Influenza B By PCR: NEGATIVE

## 2018-01-03 LAB — BASIC METABOLIC PANEL
Anion gap: 9 (ref 5–15)
BUN: 27 mg/dL — ABNORMAL HIGH (ref 8–23)
CHLORIDE: 103 mmol/L (ref 98–111)
CO2: 24 mmol/L (ref 22–32)
CREATININE: 1.12 mg/dL — AB (ref 0.44–1.00)
Calcium: 8.7 mg/dL — ABNORMAL LOW (ref 8.9–10.3)
GFR calc Af Amer: 47 mL/min — ABNORMAL LOW (ref >60)
GFR calc non Af Amer: 40 mL/min — ABNORMAL LOW (ref >60)
Glucose, Bld: 139 mg/dL — ABNORMAL HIGH (ref 70–99)
Potassium: 3.7 mmol/L (ref 3.5–5.1)
SODIUM: 136 mmol/L (ref 135–145)

## 2018-01-03 LAB — CBC
HEMATOCRIT: 23.4 % — AB (ref 36.0–46.0)
HEMOGLOBIN: 7.1 g/dL — AB (ref 12.0–15.0)
MCH: 31.6 pg (ref 26.0–34.0)
MCHC: 30.3 g/dL (ref 30.0–36.0)
MCV: 104 fL — ABNORMAL HIGH (ref 80.0–100.0)
Platelets: 198 10*3/uL (ref 150–400)
RBC: 2.25 MIL/uL — ABNORMAL LOW (ref 3.87–5.11)
RDW: 15.6 % — AB (ref 11.5–15.5)
WBC: 9.1 10*3/uL (ref 4.0–10.5)
nRBC: 0 % (ref 0.0–0.2)

## 2018-01-03 LAB — ECHOCARDIOGRAM COMPLETE
HEIGHTINCHES: 62 in
Weight: 2962.98 oz

## 2018-01-03 LAB — BRAIN NATRIURETIC PEPTIDE: B Natriuretic Peptide: 446.9 pg/mL — ABNORMAL HIGH (ref 0.0–100.0)

## 2018-01-03 LAB — TSH: TSH: 2.312 u[IU]/mL (ref 0.350–4.500)

## 2018-01-03 LAB — MRSA PCR SCREENING: MRSA by PCR: NEGATIVE

## 2018-01-03 LAB — PREPARE RBC (CROSSMATCH)

## 2018-01-03 MED ORDER — IPRATROPIUM-ALBUTEROL 0.5-2.5 (3) MG/3ML IN SOLN: 3.0000 mL | Freq: Four times a day (QID) | RESPIRATORY_TRACT | Status: DC | PRN

## 2018-01-03 MED ORDER — SODIUM CHLORIDE 0.9 % IV SOLN
1.0000 g | INTRAVENOUS | Status: DC
  Administered 2018-01-03 – 2018-01-05 (×3): 1 g via INTRAVENOUS
  Filled 2018-01-03 (×4): qty 1

## 2018-01-03 MED ORDER — VANCOMYCIN HCL IN DEXTROSE 750-5 MG/150ML-% IV SOLN
750.0000 mg | INTRAVENOUS | Status: DC
  Administered 2018-01-04 – 2018-01-06 (×3): 750 mg via INTRAVENOUS
  Filled 2018-01-03 (×3): qty 150

## 2018-01-03 MED ORDER — VANCOMYCIN HCL IN DEXTROSE 1-5 GM/200ML-% IV SOLN: 1000.0000 mg | Freq: Once | INTRAVENOUS | Status: DC

## 2018-01-03 MED ORDER — PANTOPRAZOLE SODIUM 40 MG PO TBEC
40.0000 mg | DELAYED_RELEASE_TABLET | Freq: Every day | ORAL | Status: DC
  Administered 2018-01-03 – 2018-01-08 (×6): 40 mg via ORAL
  Filled 2018-01-03 (×6): qty 1

## 2018-01-03 MED ORDER — VANCOMYCIN HCL 10 G IV SOLR
1500.0000 mg | Freq: Once | INTRAVENOUS | Status: AC
  Administered 2018-01-03: 1500 mg via INTRAVENOUS
  Filled 2018-01-03: qty 1500

## 2018-01-03 MED ORDER — SODIUM CHLORIDE 0.9 % IV SOLN: 10.0000 mL/h | Freq: Once | INTRAVENOUS | Status: DC

## 2018-01-03 MED ORDER — FUROSEMIDE 10 MG/ML IJ SOLN: 40.0000 mg | Freq: Once | INTRAMUSCULAR | Status: DC

## 2018-01-03 MED ORDER — ALLOPURINOL 100 MG PO TABS
200.0000 mg | ORAL_TABLET | Freq: Every day | ORAL | Status: DC
  Administered 2018-01-03 – 2018-01-07 (×5): 200 mg via ORAL
  Filled 2018-01-03 (×5): qty 2

## 2018-01-03 MED ORDER — DOCUSATE SODIUM 100 MG PO CAPS
100.0000 mg | ORAL_CAPSULE | Freq: Two times a day (BID) | ORAL | Status: DC | PRN
  Administered 2018-01-06: 100 mg via ORAL
  Filled 2018-01-03: qty 1

## 2018-01-03 MED ORDER — ACETAMINOPHEN 325 MG PO TABS
650.0000 mg | ORAL_TABLET | Freq: Four times a day (QID) | ORAL | Status: DC | PRN
  Administered 2018-01-03 – 2018-01-06 (×3): 650 mg via ORAL
  Filled 2018-01-03 (×3): qty 2

## 2018-01-03 MED ORDER — DILTIAZEM HCL-DEXTROSE 100-5 MG/100ML-% IV SOLN (PREMIX)
5.0000 mg/h | INTRAVENOUS | Status: DC
  Administered 2018-01-03: 5 mg/h via INTRAVENOUS
  Administered 2018-01-03: 7.5 mg/h via INTRAVENOUS
  Administered 2018-01-04: 5 mg/h via INTRAVENOUS
  Filled 2018-01-03 (×2): qty 100

## 2018-01-03 MED ORDER — FUROSEMIDE 10 MG/ML IJ SOLN
20.0000 mg | Freq: Every day | INTRAMUSCULAR | Status: DC
  Administered 2018-01-03 – 2018-01-07 (×5): 20 mg via INTRAVENOUS
  Filled 2018-01-03: qty 2
  Filled 2018-01-03: qty 4
  Filled 2018-01-03 (×4): qty 2

## 2018-01-03 MED ORDER — ACETAMINOPHEN 500 MG PO TABS
1000.0000 mg | ORAL_TABLET | Freq: Once | ORAL | Status: AC
  Administered 2018-01-03: 1000 mg via ORAL
  Filled 2018-01-03: qty 2

## 2018-01-03 MED ORDER — DILTIAZEM HCL-DEXTROSE 100-5 MG/100ML-% IV SOLN (PREMIX)
5.0000 mg/h | Freq: Once | INTRAVENOUS | Status: DC
  Filled 2018-01-03: qty 100

## 2018-01-03 MED ORDER — PAROXETINE HCL 10 MG PO TABS
10.0000 mg | ORAL_TABLET | Freq: Every day | ORAL | Status: DC
  Administered 2018-01-03 – 2018-01-07 (×5): 10 mg via ORAL
  Filled 2018-01-03 (×6): qty 1

## 2018-01-03 MED ORDER — DM-GUAIFENESIN ER 30-600 MG PO TB12
1.0000 | ORAL_TABLET | Freq: Two times a day (BID) | ORAL | Status: DC
  Administered 2018-01-03 – 2018-01-08 (×11): 1 via ORAL
  Filled 2018-01-03 (×11): qty 1

## 2018-01-03 MED ORDER — DILTIAZEM HCL-DEXTROSE 100-5 MG/100ML-% IV SOLN (PREMIX)
5.0000 mg/h | Freq: Once | INTRAVENOUS | Status: AC
  Administered 2018-01-03: 5 mg/h via INTRAVENOUS
  Filled 2018-01-03: qty 100

## 2018-01-03 MED ORDER — SODIUM CHLORIDE 0.9 % IV SOLN
2.0000 g | Freq: Once | INTRAVENOUS | Status: AC
  Administered 2018-01-03: 2 g via INTRAVENOUS
  Filled 2018-01-03: qty 2

## 2018-01-03 MED ORDER — POLYETHYLENE GLYCOL 3350 17 G PO PACK: 17.0000 g | PACK | Freq: Every day | ORAL | Status: DC | PRN

## 2018-01-03 NOTE — Progress Notes (Signed)
Patient resting comfortably on 2L [place] with no respiratory distress noted. BIPAP is not needed at this time. RT will monitor as needed. 

## 2018-01-03 NOTE — Progress Notes (Signed)
Echocardiogram 2D Echocardiogram has been performed.  01/03/2018 3:39 PM Gertie Fey, MHA, RVT, RDCS, RDMS

## 2018-01-03 NOTE — Progress Notes (Signed)
Alyssa Cameron is a 84 y.o. female with medical history significant of hypertension, diastolic CHF, CKD, anemia due to CKD, atrial fibrillation on Eliquis, recent pneumonia presenting to the hospital from Nursing home with cough, congestion, low-grade fevers for the past 4 days.    Temperature 100.5 F, tachycardic, tachypneic.  Not hypotensive on admission.  Blood glucose 157. Labs showing no leukocytosis.  Lactic acid normal.  Hemoglobin 7.6, repeat 8.2 (baseline 7-8).  BNP 446.  I-STAT troponin negative.  EKG showing A. fib with RVR (heart rate 135).  CT angios chest/abdomen/pelvis CT angios chest/abdomen/pelvis showing moderate to large rectus sheath hematoma without evidence of active extravasation. Also showing small to moderate bilateral pleural effusions and mild pulmonary edema.   Pt admitted to [facility] for evaluation of rectus sheath hematoma and mild acute hypoxic toxic respiratory failure secondary to acute decompensated diastolic heart failure.   ON EXAM:  Pt alert and comfortable on 2litof [place] oxygen. She denies any pain.  Lungs diminished at bases,  CVS no JVD. Irregular, rate controlled.  abd soft tender int he midline, bowel sounds good.  Extremities.  Trace edema.   Plan 1.  Evaluation for SIRS, pending blood cultures and influenza panel.  Currently on empiric broad-spectrum IV antibiotics.  Continue the same.   2 .  Acute respiratory failure with hypoxia Start low-dose Lasix and check for intake and output.   3.  A. fib with RVR On Cardizem drip try to transition to oral Cardizem the next 24 hours.  Holding Eliquis for rectus sheath hematoma at this time.    Kathlen Mody , MD [phone]

## 2018-01-03 NOTE — H&P (Addendum)
History and Physical    Alyssa Cameron QIW:[id number] DOB: 1918/09/19 DOA: 01/02/2018  PCP: Dorothyann Peng, NP Patient coming from: [facility]  Chief Complaint: Cough, congestion, fevers  HPI: Alyssa Cameron is a 84 y.o. female with medical history significant of hypertension, diastolic CHF, CKD, anemia due to CKD, atrial fibrillation on Eliquis, recent pneumonia presenting to the hospital from Nursing home with cough, congestion, low-grade fevers for the past 4 days.  History obtained from ED documentation: She was placed on Levaquin on October 19 for possible pneumonia.  Per report from nursing facility patient was tachycardic tonight and seemed confused over the past 24 hours.  They noticed a large area of bruising on her left flank.  No known fall.  Does not wear oxygen at the nursing facility.  Patient was oriented to self only and seemed confused.  History was provided by daughter at bedside.  States patient has been having cold symptoms for the past 5 days and has been coughing at the nursing home.  It was noted that she had bruising in her left flank area.  No reported falls.  At baseline, patient is oriented to person and place but is not able to keep track of time/ dates.   ED Course: Temperature 100.5 F, tachycardic, tachypneic.  Not hypotensive on admission.  Blood glucose 157. Labs showing no leukocytosis.  Lactic acid normal.  Hemoglobin 7.6, repeat 8.2 (baseline 7-8).  BNP 446.  I-STAT troponin negative.  EKG showing A. fib with RVR (heart rate 135).  CT angios chest/abdomen/pelvis CT angios chest/abdomen/pelvis showing moderate to large rectus sheath hematoma without evidence of active extravasation. Also showing small to moderate bilateral pleural effusions and mild pulmonary edema.  Showing urinary bladder wall thickening, evidence of cirrhosis, and colonic diverticulosis.  Patient received cefepime, vancomycin, and IV Cardizem in the ED.  2U PRBCs ordered. TRH paged to  admit.  Review of Systems: As per HPI otherwise 10 point review of systems negative.  Past Medical History:  Diagnosis Date  . A-fib ([place])   . Arthritis   . CHF (congestive heart failure) ([place])   . Diverticulitis   . Frequent UTI   . History of colon polyps   . Hx of colonic polyps    2013 - 2 tubular adenoma sigmoid and Transverse polyp  . Hypertension   . Internal hemorrhoids   . Pulmonary hypertension ([place]) 01/07/2015  . Spinal stenosis   . Urine incontinence     Past Surgical History:  Procedure Laterality Date  . APPENDECTOMY  1941  . BREAST BIOPSY  2010  . CATARACT EXTRACTION    . GALLBLADDER SURGERY  1970  . ORIF FEMUR FRACTURE Right 09/01/2017   Procedure: OPEN REDUCTION INTERNAL FIXATION (ORIF) DISTAL FEMUR FRACTURE;  Surgeon: Altamese Lake Lindsey, MD;  Location: [facility];  Service: Orthopedics;  Laterality: Right;  . TONSILLECTOMY AND ADENOIDECTOMY  1941  . VAGINAL HYSTERECTOMY       reports that she has never smoked. She has never used smokeless tobacco. She reports that she does not drink alcohol or use drugs.  Allergies  Allergen Reactions  . Demerol [Meperidine] Nausea Only  . Vicodin [Hydrocodone-Acetaminophen] Nausea And Vomiting    Family History  Problem Relation Age of Onset  . Heart failure Mother        66  . Heart failure Father        37  . Breast cancer Unknown   . Lung cancer Unknown   . Stroke Unknown  Prior to Admission medications   Medication Sig Start Date End Date Taking? Authorizing Provider  acetaminophen (TYLENOL) 500 MG tablet Take 500-1,000 mg by mouth every 8 (eight) hours as needed for moderate pain.    Yes [provider]  allopurinol (ZYLOPRIM) 100 MG tablet Take 200 mg by mouth daily.   Yes [provider]  apixaban (ELIQUIS) 2.5 MG TABS tablet Take 1 tablet (2.5 mg total) by mouth 2 (two) times daily. 03/08/17  Yes Skeet Latch, MD  colchicine 0.6 MG tablet Take 0.6 mg by mouth every 12 (twelve) hours  as needed (for finger pain).   Yes [provider]  docusate sodium (COLACE) 100 MG capsule Take 1 capsule (100 mg total) by mouth 2 (two) times daily. Patient taking differently: Take 100 mg by mouth 2 (two) times daily as needed for mild constipation.  09/06/17  Yes Pokhrel, Laxman, MD  furosemide (LASIX) 20 MG tablet Take 1 tablet (20 mg total) by mouth daily. Take with the 40 mg tablet Patient taking differently: Take 40 mg by mouth daily.  09/08/17  Yes Pokhrel, Laxman, MD  guaiFENesin (MUCINEX) 600 MG 12 hr tablet Take 600 mg by mouth 2 (two) times daily.   Yes [provider]  HYDROcodone-acetaminophen (NORCO/VICODIN) 5-325 MG tablet Take 1 tablet by mouth every 6 (six) hours as needed for moderate pain. 09/06/17  Yes Pokhrel, Laxman, MD  ipratropium-albuterol (DUONEB) 0.5-2.5 (3) MG/3ML SOLN Take 3 mLs by nebulization every 12 (twelve) hours.   Yes [provider]  ipratropium-albuterol (DUONEB) 0.5-2.5 (3) MG/3ML SOLN Take 3 mLs by nebulization every 4 (four) hours as needed (wheezing or SOB).   Yes [provider]  levofloxacin (LEVAQUIN) 500 MG tablet Take 500 mg by mouth daily.   Yes [provider]  LORazepam (ATIVAN) 0.5 MG tablet Take 0.5 mg by mouth every evening.   Yes [provider]  metoprolol tartrate (LOPRESSOR) 25 MG tablet Take 25 mg by mouth 2 (two) times daily.   Yes [provider]  ondansetron (ZOFRAN) 4 MG tablet Take 1 tablet (4 mg total) by mouth every 8 (eight) hours as needed for nausea or vomiting. 08/01/17  Yes Nafziger, Tommi Rumps, NP  pantoprazole (PROTONIX) 40 MG tablet TAKE 1 TABLET BY MOUTH  DAILY Patient taking differently: Take 40 mg by mouth daily.  06/13/17  Yes Nafziger, Tommi Rumps, NP  PARoxetine (PAXIL) 10 MG tablet Take 1 tablet (10 mg total) by mouth daily. 07/28/17  Yes Nafziger, Tommi Rumps, NP  polyethylene glycol (MIRALAX / GLYCOLAX) packet Take 17 g by mouth daily as needed for mild constipation.   Yes  [provider]  potassium chloride SA (K-DUR,KLOR-CON) 20 MEQ tablet TAKE 1 TABLET BY MOUTH  EVERY DAY Patient taking differently: Take 20 mEq by mouth daily.  10/03/17  Yes Nafziger, Tommi Rumps, NP  traZODone (DESYREL) 50 MG tablet TAKE 0.5-1 TABLETS (25-50 MG TOTAL) BY MOUTH AT BEDTIME AS NEEDED FOR SLEEP. Patient taking differently: Take 25 mg by mouth at bedtime.  06/07/17  Yes Nafziger, Tommi Rumps, NP  lisinopril (PRINIVIL,ZESTRIL) 20 MG tablet Take 1 tablet (20 mg total) by mouth daily. Patient not taking: Reported on 01/02/2018 09/08/17   Pokhrel, Corrie Mckusick, MD  metolazone (ZAROXOLYN) 2.5 MG tablet TAKE 1 TABLET (2.5 MG TOTAL) BY MOUTH ONCE A WEEK. TAKE WEEKLY ON TUESDAY Patient not taking: Reported on 01/02/2018 09/07/17 01/02/26  Skeet Latch, MD  metoprolol succinate (TOPROL-XL) 100 MG 24 hr tablet TAKE 1 TABLET BY MOUTH  DAILY WITH BREAKFAST  Patient not taking: Reported on 01/02/2018 06/12/17   Almyra Deforest, [place]    Physical Exam: Vitals:   01/03/18 0245 01/03/18 0300 01/03/18 0315 01/03/18 0330  BP: (!) 107/53 (!) 111/45  (!) 108/57  Pulse:  100    Resp:  (!) '21 19 16  '$ Temp:      TempSrc:      SpO2: 97% 97% 98% 94%  Weight:      Height:        Physical Exam  Constitutional: No distress.  Resting comfortably in a hospital stretcher  HENT:  Head: Normocephalic and atraumatic.  Mouth/Throat: Oropharynx is clear and moist.  Eyes: Pupils are equal, round, and reactive to light. EOM are normal. Right eye exhibits no discharge. Left eye exhibits no discharge.  Neck: Neck supple. No tracheal deviation present.  Cardiovascular: Normal rate, regular rhythm and intact distal pulses.  Pulmonary/Chest:  Anterior lung feels clear to auscultation.  No wheezing or rales appreciated.  Breathing comfortably on 2 L oxygen via nasal cannula.  Noted to be coughing.  Abdominal: Soft. Bowel sounds are normal. She exhibits no distension. There is tenderness. There is guarding. There is no rebound.   Left lower quadrant tender to palpation  Musculoskeletal: She exhibits no edema.  Neurological: She is alert.  Oriented to self only Smile symmetrical, no facial droop Following commands Strength 5 out of 5 throughout  Skin: Skin is warm and dry. She is not diaphoretic.  Bruising noted in the left flank area     Labs on Admission: I have personally reviewed following labs and imaging studies  CBC: Recent Labs  Lab 01/02/18 2310 01/02/18 2314  WBC 10.3  --   NEUTROABS 8.4*  --   HGB 7.6* 8.2*  HCT 24.5* 24.0*  MCV 101.7*  --   PLT 215  --    Basic Metabolic Panel: Recent Labs  Lab 01/02/18 2310 01/02/18 2314  NA 134* 136  K 3.7 3.7  CL 102 99  CO2 25  --   GLUCOSE 157* 155*  BUN 30* 30*  CREATININE 1.09* 1.10*  CALCIUM 8.8*  --    GFR: Estimated Creatinine Clearance: 30.1 mL/min (A) (by C-G formula based on SCr of 1.1 mg/dL (H)). Liver Function Tests: Recent Labs  Lab 01/02/18 2310  AST 17  ALT 13  ALKPHOS 143*  BILITOT 1.1  PROT 5.2*  ALBUMIN 2.7*   No results for input(s): LIPASE, AMYLASE in the last 168 hours. No results for input(s): AMMONIA in the last 168 hours. Coagulation Profile: Recent Labs  Lab 01/02/18 2310  INR 1.55   Cardiac Enzymes: No results for input(s): CKTOTAL, CKMB, CKMBINDEX, TROPONINI in the last 168 hours. BNP (last 3 results) No results for input(s): PROBNP in the last 8760 hours. HbA1C: No results for input(s): HGBA1C in the last 72 hours. CBG: No results for input(s): GLUCAP in the last 168 hours. Lipid Profile: No results for input(s): CHOL, HDL, LDLCALC, TRIG, CHOLHDL, LDLDIRECT in the last 72 hours. Thyroid Function Tests: No results for input(s): TSH, T4TOTAL, FREET4, T3FREE, THYROIDAB in the last 72 hours. Anemia Panel: No results for input(s): VITAMINB12, FOLATE, FERRITIN, TIBC, IRON, RETICCTPCT in the last 72 hours. Urine analysis:    Component Value Date/Time   COLORURINE YELLOW 09/01/2017 0450    APPEARANCEUR CLOUDY (A) 09/01/2017 0450   LABSPEC 1.009 09/01/2017 0450   PHURINE 5.0 09/01/2017 0450   GLUCOSEU NEGATIVE 09/01/2017 0450   HGBUR MODERATE (A) 09/01/2017 0450   BILIRUBINUR NEGATIVE  09/01/2017 0450   BILIRUBINUR n 10/20/2014 1409   KETONESUR NEGATIVE 09/01/2017 0450   PROTEINUR NEGATIVE 09/01/2017 0450   UROBILINOGEN 0.2 10/20/2014 1409   NITRITE NEGATIVE 09/01/2017 0450   LEUKOCYTESUR LARGE (A) 09/01/2017 0450    Radiological Exams on Admission: Ct Angio Chest/abd/pel For Dissection W And/or Wo Contrast  Result Date: 01/03/2018 CLINICAL DATA:  Abd pain, acute, generalized. 84 year old sent for evaluation of rapid heart rate with reddened area to abdomen. EXAM: CT ANGIOGRAPHY CHEST, ABDOMEN AND PELVIS TECHNIQUE: Multidetector CT imaging through the chest, abdomen and pelvis was performed using the standard protocol during bolus administration of intravenous contrast. Multiplanar reconstructed images and MIPs were obtained and reviewed to evaluate the vascular anatomy. CONTRAST:  64m ISOVUE-370 IOPAMIDOL (ISOVUE-370) INJECTION 76% COMPARISON:  Chest radiograph 09/04/2017.  No prior CT. FINDINGS: CTA CHEST FINDINGS Cardiovascular: Aortic atherosclerosis and tortuosity without dissection, hematoma, acute aortic injury or aneurysm. There are no filling defects in the central pulmonary arteries. Multi chamber cardiomegaly. No pericardial effusion. There is right jugular venous distention. Mediastinum/Nodes: Hilar evaluation limited by volume loss, suspect borderline bilateral hilar nodes. Small mediastinal nodes not enlarged by size criteria. Patulous upper esophagus. Small hiatal hernia. No visualized thyroid nodule. Lungs/Pleura: Small to moderate bilateral pleural effusions with adjacent compressive atelectasis. Pleural fluid tracks along the fissures. There is bilateral perihilar volume loss with scattered ground-glass opacities, nonspecific. No definite septal thickening. Trachea  and mainstem bronchi are grossly patent, partially obscured by motion. Musculoskeletal: There are no acute or suspicious osseous abnormalities. Degenerative change in the spine. Degenerative change of both shoulders. Review of the MIP images confirms the above findings. CTA ABDOMEN AND PELVIS FINDINGS VASCULAR Aorta: Diffuse aortic atherosclerosis without dissection or aneurysm. No acute aortic abnormality. Celiac: Plaque at the origin without significant stenosis. No dissection. SMA: Plaque at the origin without significant stenosis. No dissection. Vessels are patent. Renals: Widely patent right renal artery. Mild plaque at the origin the left renal artery with less than 50% stenosis. No dissection. IMA: Patent without evidence of aneurysm, dissection, vasculitis or significant stenosis. Inflow: Atherosclerosis and tortuosity. No dissection or significant stenosis. Abdominal wall: Left rectus sheath hematoma without active extravasation. Veins: No obvious venous abnormality within the limitations of this arterial phase study. Review of the MIP images confirms the above findings. NON-VASCULAR Hepatobiliary: Capsular contours are nodular raising concern for cirrhosis. No discrete focal lesion on arterial phase imaging. Gallbladder surgically absent. Common bile duct measures 11 mm, borderline for postcholecystectomy state. Pancreas: No ductal dilatation or inflammation. Spleen: Normal in size and arterial phase enhancement. Adrenals/Urinary Tract: Normal adrenal glands. No hydronephrosis or perinephric edema. 2.1 cm cyst in the mid left kidney. Bladder is partially distended, question of perivesicular edema bladder wall thickening. Stomach/Bowel: Small hiatal hernia. Stomach is nondistended limiting assessment. No bowel obstruction, inflammation or evident wall thickening. Moderate stool throughout the colon with stool distending the rectum. Mild distal colonic diverticulosis without diverticulitis. Appendix not  visualized, reported history of appendectomy. Lymphatic: No bulky adenopathy. Reproductive: Status post hysterectomy. No adnexal masses. Other: Heterogeneous enlargement of the left rectus sheath consistent with rectus sheath hematoma. This spans approximately 14 mm in craniocaudal dimension and 4.7 cm in depth. Moderate to marked diffuse body wall edema, confluent edema in the right flank. No intra-abdominal ascites or free air. Musculoskeletal: Scoliosis and degenerative change in the spine. There are no acute or suspicious osseous abnormalities. Review of the MIP images confirms the above findings. IMPRESSION: 1. Moderate to large left rectus sheath hematoma without  evidence of active extravasation. 2. Diffuse thoracoabdominal aortic atherosclerosis without acute aortic abnormality. 3. Small to moderate bilateral pleural effusions. Multi chamber cardiomegaly. Scattered ground-glass opacities both lungs may represent mild pulmonary edema but are nonspecific. There is diffuse body wall edema, constellation of findings consistent with fluid overload. 4. Suspected urinary bladder wall thickening, recommend correlation with urinalysis. 5. Nodular hepatic contours suspicious for cirrhosis. 6. Colonic diverticulosis without diverticulitis. Electronically Signed   By: Keith Rake M.D.   On: 01/03/2018 00:22    EKG: Independently reviewed.  A. fib with RVR (heart rate 135).  Assessment/Plan Principal Problem:   SIRS (systemic inflammatory response syndrome) (HCC) Active Problems:   HTN (hypertension)   Acute encephalopathy   Acute hypoxemic respiratory failure (HCC)   Acute diastolic (congestive) heart failure (HCC)   Atrial fibrillation with RVR (HCC)   Rectus sheath hematoma   Chronic anemia   CKD (chronic kidney disease) stage 3, GFR 30-59 ml/min (HCC)   Elevated alkaline phosphatase level   Gout   GERD (gastroesophageal reflux disease)   Depression   Acute encephalopathy/ SIRS -Temperature  100.5 F, tachycardic, tachypneic.  Not hypotensive on admission.  -Blood glucose 157. Labs showing no leukocytosis.  Lactic acid normal.   -CT without evidence of pneumonia. -No focal deficits on neuro exam.  -Continue broad-spectrum antibiotics (vancomycin and cefepime) -Tylenol PRN -UA, urine cx -Blood culture x2 -Influenza panel -Hold sedating medications -Check TSH  Acute hypoxemic respiratory failure due to acute decompensated diastolic congestive heart failure -Patient is not on home oxygen and not requiring 2 L oxygen via nasal cannula. -BNP 446. CT showing small to moderate bilateral pleural effusions and mild pulmonary edema. -Last echo done in October 2016 showing normal systolic function (65 to 01%), no regional wall motion abnormalities, mild mitral regurgitation, severely dilated left atrium, moderate tricuspid regurgitation, PA pressure 68 mmHg. -Currently breathing comfortably on 2 L oxygen via nasal cannula. -On Cardizem drip for A. fib with RVR. MAP in the 60s. Blood pressure not able to tolerate diuresis at this time. IV Lasix can be given when BP improved.  -Low-sodium diet with fluid restriction -Intake and output -Daily weights -Mucinex DM for cough, continue home nebulizer treatments   Atrial fibrillation with rapid ventricular rate -CHA2DS2VAsc 5.  -I-STAT troponin negative.  EKG showing A. fib with RVR (heart rate 135). -Continues to be in A. fib with RVR. Continue Cardizem drip at this time.  -Hold home eliquis in the setting of rectus sheath hematoma -Check TSH -Echo  Rectus sheath hematoma in the setting of home Eliquis use -CT showing moderate to large rectus sheath hematoma without evidence of active extravasation.  Hgb at baseline. -Hold home Eliquis   Anemia -Hemoglobin 7.6, repeat 8.2 (baseline 7-8).  2u PRBCs ordered in the ED. Will hold at this time as she is volume overloaded and Hgb at baseline.  -Repeat CBC in am  CKD 3 -Stable.   Creatinine 1.0 and GFR 41.  -Continue to monitor renal function  Elevated alkaline phosphatase -LLQ TTP on exam -Alk phos 143.  LFTs normal.  T bili normal.  History of cholecystectomy.  CT abdomen showing evidence of cirrhosis and diverticulosis without diverticulitis; no acute abnormality. -Continue to monitor   Urinary bladder wall thickening -Seen on CT. UA pending.  Hypertension -On Cardizem drip  -Hold home metoprolol and lisinopril at this time  Gout -Continue home allopurinol  GERD -Continue home Protonix  Depression -Continue home Paxil  DVT prophylaxis: SCDs Code Status: Full code  per signed document from nursing home. Family Communication: Daughters at bedside updated Disposition Plan: To be discharged to SNF in 2-3 days Consults called: None Admission status: It is my clinical opinion that admission to INPATIENT is reasonable and necessary in this 84 y.o. female . presenting with symptoms of acute encephalopathy/SIRS, acute hypoxemic respiratory failure secondary to acute decompensated congestive heart failure, A. fib with RVR, rectus sheath hematoma . with pertinent positives on physical exam including: Fever, A. fib with RVR . and pertinent positives on radiographic and laboratory data including: Pulmonary edema and rectus sheath hematoma on CT . Workup and treatment include IV Cardizem drip.  Patient will need IV diuresis when blood pressure is able to tolerate.  Given the aforementioned, the predictability of an adverse outcome is felt to be significant. I expect that the patient will require at least 2 midnights in the hospital to treat this condition.    Shela Leff MD [facility] Pager [phone]  If 7PM-7AM, please contact night-coverage [email or web address] Password [email or web address]  01/03/2018, 3:51 AM

## 2018-01-03 NOTE — Progress Notes (Signed)
Pharmacy Antibiotic Note  Alyssa Cameron is a 84 y.o. female admitted on 01/02/2018 with sepsis.  Pharmacy has been consulted for Vancomycin/Cefepime dosing. WBC WNL. CrCL ~30.   Plan: Vancomycin 750 mg IV q24h Cefepime 1g IV q24h Trend WBC, temp, renal function  F/U infectious work-up Drug levels as indicated   Height: 5\' 2"  (157.5 cm) Weight: 185 lb 3 oz (84 kg)(from June 2019 records) IBW/kg (Calculated) : 50.1  Temp (24hrs), Avg:99.2 F (37.3 C), Min:97.8 F (36.6 C), Max:100.5 F (38.1 C)  Recent Labs  Lab 01/02/18 2310 01/02/18 2314  WBC 10.3  --   CREATININE 1.09* 1.10*  LATICACIDVEN  --  1.22    Estimated Creatinine Clearance: 30.1 mL/min (A) (by C-G formula based on SCr of 1.1 mg/dL (H)).    Allergies  Allergen Reactions  . Demerol [Meperidine] Nausea Only  . Vicodin [Hydrocodone-Acetaminophen] Nausea And Vomiting     Alyssa Cameron 01/03/2018 4:32 AM

## 2018-01-04 DIAGNOSIS — N183 Chronic kidney disease, stage 3 (moderate): Secondary | ICD-10-CM

## 2018-01-04 DIAGNOSIS — S301XXD Contusion of abdominal wall, subsequent encounter: Secondary | ICD-10-CM

## 2018-01-04 DIAGNOSIS — R509 Fever, unspecified: Secondary | ICD-10-CM

## 2018-01-04 DIAGNOSIS — K219 Gastro-esophageal reflux disease without esophagitis: Secondary | ICD-10-CM

## 2018-01-04 DIAGNOSIS — D649 Anemia, unspecified: Secondary | ICD-10-CM

## 2018-01-04 LAB — CBC
HEMATOCRIT: 22.9 % — AB (ref 36.0–46.0)
Hemoglobin: 7 g/dL — ABNORMAL LOW (ref 12.0–15.0)
MCH: 31.1 pg (ref 26.0–34.0)
MCHC: 30.6 g/dL (ref 30.0–36.0)
MCV: 101.8 fL — AB (ref 80.0–100.0)
NRBC: 0 % (ref 0.0–0.2)
Platelets: 191 10*3/uL (ref 150–400)
RBC: 2.25 MIL/uL — AB (ref 3.87–5.11)
RDW: 15.5 % (ref 11.5–15.5)
WBC: 8.6 10*3/uL (ref 4.0–10.5)

## 2018-01-04 LAB — URINE CULTURE

## 2018-01-04 LAB — BASIC METABOLIC PANEL
ANION GAP: 6 (ref 5–15)
BUN: 24 mg/dL — ABNORMAL HIGH (ref 8–23)
CO2: 27 mmol/L (ref 22–32)
Calcium: 8.8 mg/dL — ABNORMAL LOW (ref 8.9–10.3)
Chloride: 102 mmol/L (ref 98–111)
Creatinine, Ser: 1.21 mg/dL — ABNORMAL HIGH (ref 0.44–1.00)
GFR calc Af Amer: 42 mL/min — ABNORMAL LOW (ref >60)
GFR calc non Af Amer: 37 mL/min — ABNORMAL LOW (ref >60)
GLUCOSE: 108 mg/dL — AB (ref 70–99)
Potassium: 3.5 mmol/L (ref 3.5–5.1)
Sodium: 135 mmol/L (ref 135–145)

## 2018-01-04 LAB — MAGNESIUM: Magnesium: 1.8 mg/dL (ref 1.7–2.4)

## 2018-01-04 LAB — PREPARE RBC (CROSSMATCH)

## 2018-01-04 MED ORDER — FUROSEMIDE 10 MG/ML IJ SOLN
20.0000 mg | Freq: Once | INTRAMUSCULAR | Status: AC
  Administered 2018-01-04: 20 mg via INTRAVENOUS
  Filled 2018-01-04: qty 2

## 2018-01-04 MED ORDER — SODIUM CHLORIDE 0.9% IV SOLUTION
Freq: Once | INTRAVENOUS | Status: AC
  Administered 2018-01-04: 12:00:00 via INTRAVENOUS

## 2018-01-04 MED ORDER — METOPROLOL TARTRATE 25 MG PO TABS
25.0000 mg | ORAL_TABLET | Freq: Two times a day (BID) | ORAL | Status: DC
  Administered 2018-01-04 – 2018-01-08 (×9): 25 mg via ORAL
  Filled 2018-01-04 (×9): qty 1

## 2018-01-04 MED ORDER — HYDROCODONE-ACETAMINOPHEN 5-325 MG PO TABS: 1.0000 | ORAL_TABLET | Freq: Four times a day (QID) | ORAL | Status: DC | PRN

## 2018-01-04 NOTE — Plan of Care (Signed)

## 2018-01-04 NOTE — Progress Notes (Signed)
PROGRESS NOTE    Alyssa Cameron  EAV:[id number] DOB: 1918/12/18 DOA: 01/02/2018 PCP: Shirline Frees, NP    Brief Narrative:  Alyssa Cameron a 84 y.o.femalewith medical history significant ofhypertension, diastolic CHF, CKD, anemia due to CKD, atrial fibrillation on Eliquis, recent pneumonia presenting to the hospital from Nursing home with cough, congestion, low-grade fevers for the past 4 days.   Temperature 100.5 F, tachycardic, tachypnea on arrival..Not hypotensive on admission. Blood glucose 157. Labs showing no leukocytosis.Lactic acid normal. Hemoglobin 7.6, repeat 8.2(baseline 7-8).BNP 446. I-STAT troponin negative. EKG showing A. fib with RVR (heart rate 135).CT angios chest/abdomen/pelvis CT angios chest/abdomen/pelvis showing moderate to large rectus sheath hematoma without evidence of active extravasation. Alsoshowing small to moderate bilateral pleural effusions andmild pulmonary edema.   Pt admitted to [facility] for evaluation of rectus sheath hematoma and mild acute hypoxic toxic respiratory failure secondary to acute decompensated diastolic heart failure.   Assessment & Plan:   Principal Problem:   SIRS (systemic inflammatory response syndrome) (HCC) Active Problems:   HTN (hypertension)   Acute encephalopathy   Acute hypoxemic respiratory failure (HCC)   Acute diastolic (congestive) heart failure (HCC)   Atrial fibrillation with RVR (HCC)   Rectus sheath hematoma   Chronic anemia   CKD (chronic kidney disease) stage 3, GFR 30-59 ml/min (HCC)   Elevated alkaline phosphatase level   Gout   GERD (gastroesophageal reflux disease)   Depression   Acute hypoxemic respiratory failure secondary to acute on chronic systolic and diastolic heart failure:  Slowly diuresing with low dose lasix , check intake and output. Diuresed only about 1 lit since admission.  Echocardiogram reviewed and discussed with the patient.    SIRS:  Fevers resolved. Her wbc count  remains wnl.  Lactic acid is wnl.  CT angio shows diffuse pulm edema with mod effusions.  Currently on broad spectrum IV antibiotics plan to transition to oral antibiotics in am.  Blood cultures have been negative so far.  UA does not indicate infection.    Acute encephalopathy:  Suspect metabolic from fever and fluid overload.  She is alert and oriented at this time.    Rectus sheath hematoma:  Spontaneous. Pt denies any pain.  Anti coagulation on hold for now.    Atrial fib with RVR Rate is better controlled. Transition off cardizem gtt and start her on oral metoprolol.    Acute anemia of blood loss from rectal sheath hematoma and Anemia of chronic disease: - transfuse 1 unit of prbc transfusion followed by lasix 1v 20 Mg once dose.  - recheck cbc in am.   GERD Stable.    Stage 3 CKD:  Creatinine between 1.1 to 1.2 , monitor on IV lasix.    Hypertension: well controlled.      DVT prophylaxis: SCD'S for now.  Code Status: DNR Family Communication: noen at bedside today, discussed with daughter yesterday evening.  Disposition Plan: pending clinical improvement.   Consultants:   None.    Procedures: none.    Antimicrobials: vancomycin and cefepime since admission.    Subjective: She is teary that she missed breakfast today. She reports she is breathing better, no fever today.   Objective: Vitals:   01/04/18 0749 01/04/18 1100 01/04/18 1200 01/04/18 1244  BP: (!) 122/55 126/69 119/77 114/62  Pulse: 74 97 (!) 103 72  Resp: 15 16 20  (!) 21  Temp: 98.3 F (36.8 C) 98.1 F (36.7 C) 98.1 F (36.7 C) 98 F (36.7 C)  TempSrc: Axillary Axillary Oral Oral  SpO2: 95% 98% 98% 98%  Weight:      Height:        Intake/Output Summary (Last 24 hours) at 01/04/2018 1354 Last data filed at 01/04/2018 1030 Gross per 24 hour  Intake 791.63 ml  Output 700 ml  Net 91.63 ml   Filed Weights   01/03/18 0120 01/03/18 1631  Weight: 84 kg 81.3 kg     Examination:  General exam: on 2 lit of Sophia oxygen. Not in distress   Respiratory system:scattered rales at bases, no wheezing heard.  Cardiovascular system: S1 & S2 heard, irregular, but rate controlled. Marland Kitchen No JVD,trace edema.  Gastrointestinal system: Abdomen is nondistended, soft and nontender.Normal bowel sounds heard. Central nervous system: Alert and oriented. Able to move all extremities .  Extremities: Symmetric 5 x 5 power. Skin: No rashes, lesions or ulcers Psychiatry:  Mood & affect appropriate.     Data Reviewed: I have personally reviewed following labs and imaging studies  CBC: Recent Labs  Lab 01/02/18 2310 01/02/18 2314 01/03/18 0450 01/04/18 0812  WBC 10.3  --  9.1 8.6  NEUTROABS 8.4*  --   --   --   HGB 7.6* 8.2* 7.1* 7.0*  HCT 24.5* 24.0* 23.4* 22.9*  MCV 101.7*  --  104.0* 101.8*  PLT 215  --  198 191   Basic Metabolic Panel: Recent Labs  Lab 01/02/18 2310 01/02/18 2314 01/03/18 0450 01/04/18 0812  NA 134* 136 136 135  K 3.7 3.7 3.7 3.5  CL 102 99 103 102  CO2 25  --  24 27  GLUCOSE 157* 155* 139* 108*  BUN 30* 30* 27* 24*  CREATININE 1.09* 1.10* 1.12* 1.21*  CALCIUM 8.8*  --  8.7* 8.8*  MG  --   --   --  1.8   GFR: Estimated Creatinine Clearance: 29.2 mL/min (A) (by C-G formula based on SCr of 1.21 mg/dL (H)). Liver Function Tests: Recent Labs  Lab 01/02/18 2310  AST 17  ALT 13  ALKPHOS 143*  BILITOT 1.1  PROT 5.2*  ALBUMIN 2.7*   No results for input(s): LIPASE, AMYLASE in the last 168 hours. No results for input(s): AMMONIA in the last 168 hours. Coagulation Profile: Recent Labs  Lab 01/02/18 2310  INR 1.55   Cardiac Enzymes: No results for input(s): CKTOTAL, CKMB, CKMBINDEX, TROPONINI in the last 168 hours. BNP (last 3 results) No results for input(s): PROBNP in the last 8760 hours. HbA1C: No results for input(s): HGBA1C in the last 72 hours. CBG: No results for input(s): GLUCAP in the last 168 hours. Lipid  Profile: No results for input(s): CHOL, HDL, LDLCALC, TRIG, CHOLHDL, LDLDIRECT in the last 72 hours. Thyroid Function Tests: Recent Labs    01/03/18 0450  TSH 2.312   Anemia Panel: No results for input(s): VITAMINB12, FOLATE, FERRITIN, TIBC, IRON, RETICCTPCT in the last 72 hours. Sepsis Labs: Recent Labs  Lab 01/02/18 2314  LATICACIDVEN 1.22    Recent Results (from the past 240 hour(s))  Blood Culture (routine x 2)     Status: None (Preliminary result)   Collection Time: 01/03/18  1:30 AM  Result Value Ref Range Status   Specimen Description BLOOD RIGHT FOREARM  Final   Special Requests   Final    BOTTLES DRAWN AEROBIC AND ANAEROBIC Blood Culture adequate volume   Culture   Final    NO GROWTH 1 DAY Performed at [facility], [address], [place], [place] [address]    Report Status  PENDING  Incomplete  Blood Culture (routine x 2)     Status: None (Preliminary result)   Collection Time: 01/03/18  1:38 AM  Result Value Ref Range Status   Specimen Description BLOOD RIGHT FOREARM  Final   Special Requests   Final    BOTTLES DRAWN AEROBIC AND ANAEROBIC Blood Culture results may not be optimal due to an inadequate volume of blood received in culture bottles   Culture   Final    NO GROWTH 1 DAY Performed at [facility], [address], [place], [place] [address]    Report Status PENDING  Incomplete  Culture, Urine     Status: Abnormal   Collection Time: 01/03/18  4:50 AM  Result Value Ref Range Status   Specimen Description URINE, RANDOM  Final   Special Requests NONE  Final   Culture (A)  Final    <10,000 COLONIES/mL INSIGNIFICANT GROWTH Performed at [facility], [address] [place], [place] [address]    Report Status 01/04/2018 FINAL  Final  MRSA PCR Screening     Status: None   Collection Time: 01/03/18  4:16 PM  Result Value Ref Range Status   MRSA by PCR NEGATIVE NEGATIVE Final    Comment:        The GeneXpert MRSA Assay (FDA approved  for NASAL specimens only), is one component of a comprehensive MRSA colonization surveillance program. It is not intended to diagnose MRSA infection nor to guide or monitor treatment for MRSA infections. Performed at [facility], [address] [place], [place] [address]          Radiology Studies: Ct Angio Chest/abd/pel For Dissection W And/or Wo Contrast  Result Date: 01/03/2018 CLINICAL DATA:  Abd pain, acute, generalized. 84 year old sent for evaluation of rapid heart rate with reddened area to abdomen. EXAM: CT ANGIOGRAPHY CHEST, ABDOMEN AND PELVIS TECHNIQUE: Multidetector CT imaging through the chest, abdomen and pelvis was performed using the standard protocol during bolus administration of intravenous contrast. Multiplanar reconstructed images and MIPs were obtained and reviewed to evaluate the vascular anatomy. CONTRAST:  80mL ISOVUE-370 IOPAMIDOL (ISOVUE-370) INJECTION 76% COMPARISON:  Chest radiograph 09/04/2017.  No prior CT. FINDINGS: CTA CHEST FINDINGS Cardiovascular: Aortic atherosclerosis and tortuosity without dissection, hematoma, acute aortic injury or aneurysm. There are no filling defects in the central pulmonary arteries. Multi chamber cardiomegaly. No pericardial effusion. There is right jugular venous distention. Mediastinum/Nodes: Hilar evaluation limited by volume loss, suspect borderline bilateral hilar nodes. Small mediastinal nodes not enlarged by size criteria. Patulous upper esophagus. Small hiatal hernia. No visualized thyroid nodule. Lungs/Pleura: Small to moderate bilateral pleural effusions with adjacent compressive atelectasis. Pleural fluid tracks along the fissures. There is bilateral perihilar volume loss with scattered ground-glass opacities, nonspecific. No definite septal thickening. Trachea and mainstem bronchi are grossly patent, partially obscured by motion. Musculoskeletal: There are no acute or suspicious osseous abnormalities. Degenerative  change in the spine. Degenerative change of both shoulders. Review of the MIP images confirms the above findings. CTA ABDOMEN AND PELVIS FINDINGS VASCULAR Aorta: Diffuse aortic atherosclerosis without dissection or aneurysm. No acute aortic abnormality. Celiac: Plaque at the origin without significant stenosis. No dissection. SMA: Plaque at the origin without significant stenosis. No dissection. Vessels are patent. Renals: Widely patent right renal artery. Mild plaque at the origin the left renal artery with less than 50% stenosis. No dissection. IMA: Patent without evidence of aneurysm, dissection, vasculitis or significant stenosis. Inflow: Atherosclerosis and tortuosity. No dissection or  significant stenosis. Abdominal wall: Left rectus sheath hematoma without active extravasation. Veins: No obvious venous abnormality within the limitations of this arterial phase study. Review of the MIP images confirms the above findings. NON-VASCULAR Hepatobiliary: Capsular contours are nodular raising concern for cirrhosis. No discrete focal lesion on arterial phase imaging. Gallbladder surgically absent. Common bile duct measures 11 mm, borderline for postcholecystectomy state. Pancreas: No ductal dilatation or inflammation. Spleen: Normal in size and arterial phase enhancement. Adrenals/Urinary Tract: Normal adrenal glands. No hydronephrosis or perinephric edema. 2.1 cm cyst in the mid left kidney. Bladder is partially distended, question of perivesicular edema bladder wall thickening. Stomach/Bowel: Small hiatal hernia. Stomach is nondistended limiting assessment. No bowel obstruction, inflammation or evident wall thickening. Moderate stool throughout the colon with stool distending the rectum. Mild distal colonic diverticulosis without diverticulitis. Appendix not visualized, reported history of appendectomy. Lymphatic: No bulky adenopathy. Reproductive: Status post hysterectomy. No adnexal masses. Other: Heterogeneous  enlargement of the left rectus sheath consistent with rectus sheath hematoma. This spans approximately 14 mm in craniocaudal dimension and 4.7 cm in depth. Moderate to marked diffuse body wall edema, confluent edema in the right flank. No intra-abdominal ascites or free air. Musculoskeletal: Scoliosis and degenerative change in the spine. There are no acute or suspicious osseous abnormalities. Review of the MIP images confirms the above findings. IMPRESSION: 1. Moderate to large left rectus sheath hematoma without evidence of active extravasation. 2. Diffuse thoracoabdominal aortic atherosclerosis without acute aortic abnormality. 3. Small to moderate bilateral pleural effusions. Multi chamber cardiomegaly. Scattered ground-glass opacities both lungs may represent mild pulmonary edema but are nonspecific. There is diffuse body wall edema, constellation of findings consistent with fluid overload. 4. Suspected urinary bladder wall thickening, recommend correlation with urinalysis. 5. Nodular hepatic contours suspicious for cirrhosis. 6. Colonic diverticulosis without diverticulitis. Electronically Signed   By: Narda Rutherford M.D.   On: 01/03/2018 00:22        Scheduled Meds: . sodium chloride   Intravenous Once  . allopurinol  200 mg Oral Daily  . dextromethorphan-guaiFENesin  1 tablet Oral BID  . furosemide  20 mg Intravenous Daily  . furosemide  20 mg Intravenous Once  . metoprolol tartrate  25 mg Oral BID  . pantoprazole  40 mg Oral Daily  . PARoxetine  10 mg Oral Daily   Continuous Infusions: . ceFEPime (MAXIPIME) IV 1 g (01/03/18 2307)  . diltiazem (CARDIZEM) infusion 5 mg/hr (01/04/18 0604)  . vancomycin 750 mg (01/04/18 0340)     LOS: 1 day    Time spent: 35 minutes.     Kathlen Mody, MD [facility] Pager [phone]  If 7PM-7AM, please contact night-coverage [email or web address] Password [email or web address] 01/04/2018, 1:54 PM

## 2018-01-04 NOTE — Progress Notes (Signed)
Patient does not appear to be in any respiratory distress at this time. Sp02=96%. No bipap needed at this time.

## 2018-01-04 NOTE — Clinical Social Work Note (Signed)
Clinical Social Work Assessment  Patient Details  Name: Alyssa Cameron MRN: [id number] Date of Birth: 1918-09-05  Date of referral:  01/04/18               Reason for consult:  Facility Placement, Discharge Planning                Permission sought to share information with:  Facility Sport and exercise psychologist, Family Supports Permission granted to share information::  Yes, Verbal Permission Granted  Name::     Alyssa Cameron  Agency::  [facility]  Relationship::  daughter  Contact Information:  [phone]  Housing/Transportation Living arrangements for the past 2 months:  [place] of Information:  Adult Children Patient Interpreter Needed:  None Criminal Activity/Legal Involvement Pertinent to Current Situation/Hospitalization:  No - Comment as needed Significant Relationships:  Adult Children Lives with:  Facility Resident Do you feel safe going back to the place where you live?  Yes Need for family participation in patient care:  Yes (Comment)  Care giving concerns: Patient is a long term care resident at [facility].    Social Worker assessment / plan: CSW met with patient, son, and daughter in law at bedside. CSW introduced self and role and discussed disposition planning. Family agreeable for patient to return to [facility]. Family indicated patient has lived at the facility since June. She initially went in for rehab and then transitioned to long term care.   CSW spoke to admissions at [facility]. The facility will accept patient back when medically ready. Patient does not require a PT evaluation, as she is a long term care resident. If rehab is recommended again, facility will need to obtain new [facility] authorization.  CSW to follow and support with discharge planning.  Employment status:  Retired Research officer, political party) PT Recommendations:  Not assessed at this time Information / Referral to community resources:  [place]  Patient/Family's Response to care: Family appreciative of care.  Patient/Family's Understanding of and Emotional Response to Diagnosis, Current Treatment, and Prognosis: Family with understanding of patient's condition and agreeable for patient to return to SNF.  Emotional Assessment Appearance:  Appears stated age Attitude/Demeanor/Rapport:  Engaged Affect (typically observed):  Accepting, Calm, Appropriate, Pleasant Orientation:  Oriented to Self, Oriented to Place Alcohol / Substance use:  Not Applicable Psych involvement (Current and /or in the community):  No (Comment)  Discharge Needs  Concerns to be addressed:  Discharge Planning Concerns, Care Coordination Readmission within the last 30 days:  No Current discharge risk:  Physical Impairment Barriers to Discharge:  Continued Medical Work up   Estanislado Emms, LCSW 01/04/2018, 4:26 PM

## 2018-01-04 NOTE — Progress Notes (Signed)
PT Cancellation Note  Patient Details Name: Alyssa Cameron MRN: [id number] DOB: 1919/03/01   Cancelled Treatment:    Reason Eval/Treat Not Completed: Patient not medically ready. Pt with Hgb 7.0, currently receiving transfusion. Will follow-up for PT evaluation.  Ina Homes, PT, DPT Acute Rehabilitation Services  Pager [phone] Office [phone]  Malachy Chamber 01/04/2018, 3:19 PM

## 2018-01-05 ENCOUNTER — Encounter (HOSPITAL_COMMUNITY): Payer: Self-pay | Admitting: *Deleted

## 2018-01-05 ENCOUNTER — Inpatient Hospital Stay (HOSPITAL_COMMUNITY): Payer: Medicare Other

## 2018-01-05 LAB — BASIC METABOLIC PANEL
ANION GAP: 8 (ref 5–15)
BUN: 23 mg/dL (ref 8–23)
CALCIUM: 8.6 mg/dL — AB (ref 8.9–10.3)
CO2: 26 mmol/L (ref 22–32)
Chloride: 100 mmol/L (ref 98–111)
Creatinine, Ser: 1.27 mg/dL — ABNORMAL HIGH (ref 0.44–1.00)
GFR calc Af Amer: 40 mL/min — ABNORMAL LOW (ref >60)
GFR, EST NON AFRICAN AMERICAN: 34 mL/min — AB (ref >60)
GLUCOSE: 220 mg/dL — AB (ref 70–99)
Potassium: 3.3 mmol/L — ABNORMAL LOW (ref 3.5–5.1)
Sodium: 134 mmol/L — ABNORMAL LOW (ref 135–145)

## 2018-01-05 LAB — CBC
HCT: 28 % — ABNORMAL LOW (ref 36.0–46.0)
Hemoglobin: 8.6 g/dL — ABNORMAL LOW (ref 12.0–15.0)
MCH: 30.4 pg (ref 26.0–34.0)
MCHC: 30.7 g/dL (ref 30.0–36.0)
MCV: 98.9 fL (ref 80.0–100.0)
Platelets: 214 K/uL (ref 150–400)
RBC: 2.83 MIL/uL — ABNORMAL LOW (ref 3.87–5.11)
RDW: 16.8 % — ABNORMAL HIGH (ref 11.5–15.5)
WBC: 13.5 K/uL — ABNORMAL HIGH (ref 4.0–10.5)
nRBC: 0 % (ref 0.0–0.2)

## 2018-01-05 NOTE — Care Management Important Message (Signed)
Important Message  Patient Details  Name: Alyssa Cameron MRN: [id number] Date of Birth: 06-03-1918   Medicare Important Message Given:  Yes    Kadyn Chovan 01/05/2018, 11:44 AM

## 2018-01-05 NOTE — Evaluation (Signed)
Physical Therapy Evaluation Patient Details Name: Alyssa Cameron MRN: [id number] DOB: [birth year] Today's Date: 01/05/2018   History of Present Illness  84 y.o. female with medical history significant of hypertension, diastolic CHF, CKD, anemia due to CKD, atrial fibrillation on Eliquis, R femur ORIF 08/2017, recent pneumonia and admitted for SIRS, evaluation of rectus sheath hematoma and mild acute hypoxic toxic respiratory failure secondary to acute decompensated diastolic heart failure  Clinical Impression  Pt admitted with above diagnosis. Pt currently with functional limitations due to the deficits listed below (see PT Problem List).  Pt will benefit from skilled PT to increase their independence and safety with mobility to allow discharge to the venue listed below.   Pt and family report decline in mobility since hip surgery in June.  Pt no longer ambulatory and has been assisted to w/c at SNF.  Family hopeful for more rehab.  Pt may benefit from further rehab upon return to SNF.  Pt attempting to assist with bed mobility however limited today by weakness and abdominal pain.  Family would like pt to be able more independent with w/c transfers.     Follow Up Recommendations SNF    Equipment Recommendations  None recommended by PT    Recommendations for Other Services       Precautions / Restrictions Precautions Precautions: Fall Precaution Comments: rectus sheath hematoma  Restrictions Weight Bearing Restrictions: No      Mobility  Bed Mobility Overal bed mobility: Needs Assistance Bed Mobility: Supine to Sit;Sit to Supine     Supine to sit: Max assist;HOB elevated Sit to supine: Max assist   General bed mobility comments: verbal cues for technique, assist required for upper body upright, positioning with bed pads utilized, also required assist for LEs onto bed  Transfers Overall transfer level: Needs assistance               General transfer comment: attempted however  pt unable (would need +2 or equipment)  Ambulation/Gait                Stairs            Wheelchair Mobility    Modified Rankin (Stroke Patients Only)       Balance Overall balance assessment: Needs assistance Sitting-balance support: Bilateral upper extremity supported;Feet supported Sitting balance-Leahy Scale: Poor Sitting balance - Comments: requires UE support                                     Pertinent Vitals/Pain Pain Assessment: Faces Faces Pain Scale: Hurts even more Pain Location: abdomen (rectus sheath hematoma) Pain Descriptors / Indicators: Grimacing;Discomfort Pain Intervention(s): Limited activity within patient's tolerance;Monitored during session;Repositioned    Home Living Family/patient expects to be discharged to:: Skilled nursing facility                      Prior Function Level of Independence: Needs assistance   Gait / Transfers Assistance Needed: family reports pt was mod I with RW prior to hip fracture, since June she has been at [facility] and now resident; currently requires assist for w/c transfer           Hand Dominance        Extremity/Trunk Assessment   Upper Extremity Assessment Upper Extremity Assessment: Generalized weakness    Lower Extremity Assessment Lower Extremity Assessment: Generalized weakness;RLE deficits/detail RLE Deficits / Details: reports pain in  R LE since hip surgery       Communication   Communication: HOH  Cognition Arousal/Alertness: Awake/alert Behavior During Therapy: WFL for tasks assessed/performed Overall Cognitive Status: Difficult to assess                                 General Comments: appropriate for session      General Comments      Exercises  Pt sat EOB and performed bilateral knee extension x10 and ankle pumps x10.   Assessment/Plan    PT Assessment Patient needs continued PT services  PT Problem List Decreased  strength;Decreased range of motion;Decreased mobility;Decreased activity tolerance;Decreased balance;Decreased knowledge of use of DME       PT Treatment Interventions DME instruction;Therapeutic activities;Patient/family education;Therapeutic exercise;Functional mobility training;Balance training;Wheelchair mobility training    PT Goals (Current goals can be found in the Care Plan section)  Acute Rehab PT Goals Patient Stated Goal: family would like pt to be able to at least transfer more independently PT Goal Formulation: With patient/family Time For Goal Achievement: 01/19/18 Potential to Achieve Goals: Fair    Frequency Min 2X/week   Barriers to discharge        Co-evaluation               AM-PAC PT "6 Clicks" Daily Activity  Outcome Measure Difficulty turning over in bed (including adjusting bedclothes, sheets and blankets)?: Unable Difficulty moving from lying on back to sitting on the side of the bed? : Unable Difficulty sitting down on and standing up from a chair with arms (e.g., wheelchair, bedside commode, etc,.)?: Unable Help needed moving to and from a bed to chair (including a wheelchair)?: Total Help needed walking in hospital room?: Total Help needed climbing 3-5 steps with a railing? : Total 6 Click Score: 6    End of Session Equipment Utilized During Treatment: Gait belt;Oxygen Activity Tolerance: Patient tolerated treatment well Patient left: in bed;with bed alarm set;with call bell/phone within reach;with family/visitor present   PT Visit Diagnosis: Muscle weakness (generalized) (M62.81)    Time: 6295-2841 PT Time Calculation (min) (ACUTE ONLY): 21 min   Charges:   PT Evaluation $PT Eval Low Complexity: 1 Low         Zenovia Jarred, PT, DPT Acute Rehabilitation Services Office: [phone] Pager: [phone]  Sarajane Jews 01/05/2018, 12:03 PM

## 2018-01-05 NOTE — Progress Notes (Signed)
PROGRESS NOTE    Alyssa Cameron  ZOX:[id number] DOB: Sep 22, 1918 DOA: 01/02/2018 PCP: Shirline Frees, NP    Brief Narrative:  Alyssa Cameron a 84 y.o.femalewith medical history significant ofhypertension, diastolic CHF, CKD, anemia due to CKD, atrial fibrillation on Eliquis, recent pneumonia presenting to the hospital from Nursing home with cough, congestion, low-grade fevers for the past 4 days.   Temperature 100.5 F, tachycardic, tachypnea on arrival..Not hypotensive on admission. Blood glucose 157. Labs showing no leukocytosis.Lactic acid normal. Hemoglobin 7.6, repeat 8.2(baseline 7-8).BNP 446. I-STAT troponin negative. EKG showing A. fib with RVR (heart rate 135).CT angios chest/abdomen/pelvis CT angios chest/abdomen/pelvis showing moderate to large rectus sheath hematoma without evidence of active extravasation. Alsoshowing small to moderate bilateral pleural effusions andmild pulmonary edema.   Pt admitted to [facility] for evaluation of rectus sheath hematoma and mild acute hypoxic toxic respiratory failure secondary to acute decompensated diastolic heart failure.   Assessment & Plan:   Principal Problem:   SIRS (systemic inflammatory response syndrome) (HCC) Active Problems:   HTN (hypertension)   Acute encephalopathy   Acute hypoxemic respiratory failure (HCC)   Acute diastolic (congestive) heart failure (HCC)   Atrial fibrillation with RVR (HCC)   Rectus sheath hematoma   Chronic anemia   CKD (chronic kidney disease) stage 3, GFR 30-59 ml/min (HCC)   Elevated alkaline phosphatase level   Gout   GERD (gastroesophageal reflux disease)   Depression   Acute hypoxemic respiratory failure secondary to acute on chronic systolic and diastolic heart failure:  Slowly diuresing with low dose lasix , Diuresed only about 1.3 lit yesterday, continue with strict intake and output.  Resume the low dose lasix and monitor renal parameters and electrolytes while on lasix.    Echocardiogram reviewed and discussed with the patient and family. Repeat CXR continues to show interstitial edema. She is on 3 lit of [place] oxygen.  Discussed in detail regarding the patient's prognosis , and family requested to speak with palliative care consult for goals of care.     SIRS:  Fevers resolved. Her wbc count remains wnl.  Lactic acid is wnl.  CT angio shows diffuse pulm edema with mod effusions.  Currently on broad spectrum IV antibiotics, no fever, but she has elevated wbc count and she continues to cough. Get SLP evaluation to evaluate for aspiration.  Blood cultures have been negative so far.  UA does not indicate infection.    Acute encephalopathy:  Suspect metabolic from fever and fluid overload.  She is alert and oriented at this time. Appears to have improved.   Rectus sheath hematoma:  Spontaneous. Pt denies any pain.  Anti coagulation on hold for now. We will hold that now that family wants GOC meeting with palliative care.    Atrial fib with RVR Rate is better controlled. Transitioned off cardizem gtt and started her on oral metoprolol.    Acute anemia of blood loss from rectal sheath hematoma and Anemia of chronic disease: - transfuse 1 unit of prbc transfusion followed by lasix 1v 20 Mg once dose.  - recheck cbc in am show hemoglobin of 8.6.   GERD Stable.    Stage 3 CKD:  Creatinine between 1.1 to 1.2 , monitor on IV lasix.  No change in creatinine today.  Continue to monitor.    Hypertension: well controlled.   Hypokalemia Replaced.    DVT prophylaxis: SCD'S for now.  Code Status: DNR Family Communication: discussed with both daughters at bedside.   Disposition Plan: pending clinical improvement.  Consultants:   None.    Procedures: none.    Antimicrobials: vancomycin and cefepime since admission.    Subjective: She is coughing with eating and drinking. SLP eval ordered. Denies any pain in the abdomen or epigastric area.    Objective: Vitals:   01/05/18 0749 01/05/18 0800 01/05/18 0900 01/05/18 1148  BP: (!) 125/91 (!) 119/59 132/79 121/63  Pulse: 93 85 93 77  Resp: (!) 22 17 (!) 21 19  Temp: 98.5 F (36.9 C)   98.2 F (36.8 C)  TempSrc: Oral   Oral  SpO2: 96% 96% 98% 99%  Weight:      Height:        Intake/Output Summary (Last 24 hours) at 01/05/2018 1836 Last data filed at 01/05/2018 1300 Gross per 24 hour  Intake 749.33 ml  Output 1300 ml  Net -550.67 ml   Filed Weights   01/03/18 0120 01/03/18 1631 01/05/18 0628  Weight: 84 kg 81.3 kg 80.2 kg    Examination:  General exam: on 2 lit of [place] oxygen. Mild distress from coughing.  Respiratory system:scattered rales at bases, no wheezing heard. Air entry fair.  Cardiovascular system: S1 & S2 heard, irregular, no JVD, trace edema.  Gastrointestinal system: Abdomen is soft non tender non distended bowel sounds good.  Central nervous system: Alert and oriented. Able to move all extremities .  Extremities: Symmetric 5 x 5 power. Skin: No rashes, lesions or ulcers Psychiatry:  Mood & affect appropriate.     Data Reviewed: I have personally reviewed following labs and imaging studies  CBC: Recent Labs  Lab 01/02/18 2310 01/02/18 2314 01/03/18 0450 01/04/18 0812 01/05/18 1000  WBC 10.3  --  9.1 8.6 13.5*  NEUTROABS 8.4*  --   --   --   --   HGB 7.6* 8.2* 7.1* 7.0* 8.6*  HCT 24.5* 24.0* 23.4* 22.9* 28.0*  MCV 101.7*  --  104.0* 101.8* 98.9  PLT 215  --  198 191 214   Basic Metabolic Panel: Recent Labs  Lab 01/02/18 2310 01/02/18 2314 01/03/18 0450 01/04/18 0812 01/05/18 1000  NA 134* 136 136 135 134*  K 3.7 3.7 3.7 3.5 3.3*  CL 102 99 103 102 100  CO2 25  --  24 27 26   GLUCOSE 157* 155* 139* 108* 220*  BUN 30* 30* 27* 24* 23  CREATININE 1.09* 1.10* 1.12* 1.21* 1.27*  CALCIUM 8.8*  --  8.7* 8.8* 8.6*  MG  --   --   --  1.8  --    GFR: Estimated Creatinine Clearance: 27.7 mL/min (A) (by C-G formula based on SCr of 1.27  mg/dL (H)). Liver Function Tests: Recent Labs  Lab 01/02/18 2310  AST 17  ALT 13  ALKPHOS 143*  BILITOT 1.1  PROT 5.2*  ALBUMIN 2.7*   No results for input(s): LIPASE, AMYLASE in the last 168 hours. No results for input(s): AMMONIA in the last 168 hours. Coagulation Profile: Recent Labs  Lab 01/02/18 2310  INR 1.55   Cardiac Enzymes: No results for input(s): CKTOTAL, CKMB, CKMBINDEX, TROPONINI in the last 168 hours. BNP (last 3 results) No results for input(s): PROBNP in the last 8760 hours. HbA1C: No results for input(s): HGBA1C in the last 72 hours. CBG: No results for input(s): GLUCAP in the last 168 hours. Lipid Profile: No results for input(s): CHOL, HDL, LDLCALC, TRIG, CHOLHDL, LDLDIRECT in the last 72 hours. Thyroid Function Tests: Recent Labs    01/03/18 0450  TSH 2.312  Anemia Panel: No results for input(s): VITAMINB12, FOLATE, FERRITIN, TIBC, IRON, RETICCTPCT in the last 72 hours. Sepsis Labs: Recent Labs  Lab 01/02/18 2314  LATICACIDVEN 1.22    Recent Results (from the past 240 hour(s))  Blood Culture (routine x 2)     Status: None (Preliminary result)   Collection Time: 01/03/18  1:30 AM  Result Value Ref Range Status   Specimen Description BLOOD RIGHT FOREARM  Final   Special Requests   Final    BOTTLES DRAWN AEROBIC AND ANAEROBIC Blood Culture adequate volume   Culture   Final    NO GROWTH 2 DAYS Performed at [facility], [address] [place], [place] [address]    Report Status PENDING  Incomplete  Blood Culture (routine x 2)     Status: None (Preliminary result)   Collection Time: 01/03/18  1:38 AM  Result Value Ref Range Status   Specimen Description BLOOD RIGHT FOREARM  Final   Special Requests   Final    BOTTLES DRAWN AEROBIC AND ANAEROBIC Blood Culture results may not be optimal due to an inadequate volume of blood received in culture bottles   Culture   Final    NO GROWTH 2 DAYS Performed at [facility], [address] [place], [place] [address]    Report Status PENDING  Incomplete  Culture, Urine     Status: Abnormal   Collection Time: 01/03/18  4:50 AM  Result Value Ref Range Status   Specimen Description URINE, RANDOM  Final   Special Requests NONE  Final   Culture (A)  Final    <10,000 COLONIES/mL INSIGNIFICANT GROWTH Performed at [facility], [address], [place], [place] [address]    Report Status 01/04/2018 FINAL  Final  MRSA PCR Screening     Status: None   Collection Time: 01/03/18  4:16 PM  Result Value Ref Range Status   MRSA by PCR NEGATIVE NEGATIVE Final    Comment:        The GeneXpert MRSA Assay (FDA approved for NASAL specimens only), is one component of a comprehensive MRSA colonization surveillance program. It is not intended to diagnose MRSA infection nor to guide or monitor treatment for MRSA infections. Performed at [facility], [address] [place], [place] [address]          Radiology Studies: Dg Chest Port 1 View  Result Date: 01/05/2018 CLINICAL DATA:  Evaluate for pulmonary edema.  Cough and congestion. EXAM: PORTABLE CHEST 1 VIEW COMPARISON:  CT 01/02/2018.  Chest x-ray 09/04/2017. FINDINGS: Cardiomegaly with progressive bilateral pulmonary interstitial prominence suggesting congestive heart failure with interstitial edema. Bilateral mid lung field subsegmental atelectasis. Small bilateral pleural effusions. No pneumothorax. IMPRESSION: 1. Cardiomegaly with progressive bilateral pulmonary interstitial prominence suggesting congestive heart failure with interstitial edema. Small bilateral pleural effusions. 2.  Bilateral mid lung field subsegmental atelectasis. Electronically Signed   By: Maisie Fus  Register   On: 01/05/2018 13:27        Scheduled Meds: . allopurinol  200 mg Oral Daily  . dextromethorphan-guaiFENesin  1 tablet Oral BID  . furosemide  20 mg Intravenous Daily  . metoprolol tartrate  25 mg Oral BID  . pantoprazole  40  mg Oral Daily  . PARoxetine  10 mg Oral Daily   Continuous Infusions: . ceFEPime (MAXIPIME) IV Stopped (01/04/18 2147)  . diltiazem (CARDIZEM) infusion Stopped (01/04/18 2203)  . vancomycin 750 mg (01/05/18 0357)     LOS:  2 days    Time spent: 35 minutes.     Kathlen Mody, MD [facility] Pager [phone]  If 7PM-7AM, please contact night-coverage [email or web address] Password [facility] 01/05/2018, 6:36 PM

## 2018-01-05 NOTE — NC FL2 (Signed)
[place] MEDICAID FL2 LEVEL OF CARE SCREENING TOOL     IDENTIFICATION  Patient Name: Alyssa Cameron Birthdate: 26-Jun-1918 Sex: female Admission Date (Current Location): 01/02/2018  [facility] and [place] Number:  Producer, television/film/video and Address:  [facility] [place], [address] [place], [place] [address]      Provider Number: [id number]  Attending Physician Name and Address:  Kathlen Mody, MD  Relative Name and Phone Number:  Shea Stakes, Daughter, [phone]     Current Level of Care: Hospital Recommended Level of Care: Skilled Nursing Facility Prior Approval Number:    Date Approved/Denied:   PASRR Number: [id number]  Discharge Plan: SNF    Current Diagnoses: Patient Active Problem List   Diagnosis Date Noted  . SIRS (systemic inflammatory response syndrome) (HCC) 01/03/2018  . Acute encephalopathy 01/03/2018  . Acute hypoxemic respiratory failure (HCC) 01/03/2018  . Acute diastolic (congestive) heart failure (HCC) 01/03/2018  . Atrial fibrillation with RVR (HCC) 01/03/2018  . Rectus sheath hematoma 01/03/2018  . Chronic anemia 01/03/2018  . CKD (chronic kidney disease) stage 3, GFR 30-59 ml/min (HCC) 01/03/2018  . Elevated alkaline phosphatase level 01/03/2018  . Gout 01/03/2018  . GERD (gastroesophageal reflux disease) 01/03/2018  . Depression 01/03/2018  . Femur fracture, right (HCC) 09/01/2017  . CKD (chronic kidney disease), stage IV (HCC) 09/01/2017  . Anemia of chronic disease 09/01/2017  . Falls, initial encounter 09/01/2017  . Urine incontinence   . Spinal stenosis   . Internal hemorrhoids   . HTN (hypertension)   . Hx of colonic polyps   . History of colon polyps   . Frequent UTI   . Diverticulitis   . CHF (congestive heart failure) (HCC)   . A-fib (HCC)   . Renal insufficiency 01/13/2017  . Pulmonary hypertension (HCC) 01/07/2015  . Chronic atrial fibrillation 10/20/2014  . Acute on chronic right heart failure (HCC)  10/20/2014  . Essential hypertension 10/20/2014  . Arthritis 10/20/2014  . Chronic UTI 10/20/2014    Orientation RESPIRATION BLADDER Height & Weight     Self, Place  O2(nasal cannula 2L) Incontinent, External catheter Weight: 80.2 kg Height:  5\' 6"  (167.6 cm)  BEHAVIORAL SYMPTOMS/MOOD NEUROLOGICAL BOWEL NUTRITION STATUS      Continent Diet(please see DC summary)  AMBULATORY STATUS COMMUNICATION OF NEEDS Skin   Extensive Assist Verbally Normal                       Personal Care Assistance Level of Assistance  Bathing, Feeding, Dressing Bathing Assistance: Maximum assistance Feeding assistance: Limited assistance Dressing Assistance: Maximum assistance     Functional Limitations Info  Sight, Hearing Sight Info: Adequate Hearing Info: Adequate Speech Info: Adequate    SPECIAL CARE FACTORS FREQUENCY  PT (By licensed PT)     PT Frequency: 5x/week              Contractures Contractures Info: Not present    Additional Factors Info  Code Status, Allergies, Psychotropic Code Status Info: DNR Allergies Info: Demerol Meperidine, Vicodin Hydrocodone-acetaminophen Psychotropic Info: paxil         Current Medications (01/05/2018):  This is the current hospital active medication list Current Facility-Administered Medications  Medication Dose Route Frequency Provider Last Rate Last Dose  . acetaminophen (TYLENOL) tablet 650 mg  650 mg Oral Q6H PRN Kathlen Mody, MD   650 mg at 01/04/18 2106  . allopurinol (ZYLOPRIM) tablet 200 mg  200 mg Oral Daily Kathlen Mody, MD  200 mg at 01/05/18 0840  . ceFEPIme (MAXIPIME) 1 g in sodium chloride 0.9 % 100 mL IVPB  1 g Intravenous Q24H Kathlen Mody, MD   Stopped at 01/04/18 2147  . dextromethorphan-guaiFENesin (MUCINEX DM) 30-600 MG per 12 hr tablet 1 tablet  1 tablet Oral BID Kathlen Mody, MD   1 tablet at 01/05/18 0840  . diltiazem (CARDIZEM) 100 mg in dextrose 5% (1 mg/mL) infusion  5-15 mg/hr Intravenous Titrated  Kathlen Mody, MD   Stopped at 01/04/18 2203  . docusate sodium (COLACE) capsule 100 mg  100 mg Oral BID PRN Kathlen Mody, MD      . furosemide (LASIX) injection 20 mg  20 mg Intravenous Daily Kathlen Mody, MD   20 mg at 01/05/18 0841  . HYDROcodone-acetaminophen (NORCO/VICODIN) 5-325 MG per tablet 1 tablet  1 tablet Oral Q6H PRN Kathlen Mody, MD      . ipratropium-albuterol (DUONEB) 0.5-2.5 (3) MG/3ML nebulizer solution 3 mL  3 mL Nebulization Q6H PRN Kathlen Mody, MD      . metoprolol tartrate (LOPRESSOR) tablet 25 mg  25 mg Oral BID Kathlen Mody, MD   25 mg at 01/05/18 0839  . pantoprazole (PROTONIX) EC tablet 40 mg  40 mg Oral Daily Kathlen Mody, MD   40 mg at 01/05/18 0839  . PARoxetine (PAXIL) tablet 10 mg  10 mg Oral Daily Kathlen Mody, MD   10 mg at 01/05/18 0840  . polyethylene glycol (MIRALAX / GLYCOLAX) packet 17 g  17 g Oral Daily PRN Kathlen Mody, MD      . vancomycin (VANCOCIN) IVPB 750 mg/150 ml premix  750 mg Intravenous Q24H Kathlen Mody, MD 150 mL/hr at 01/05/18 0357 750 mg at 01/05/18 0357     Discharge Medications: Please see discharge summary for a list of discharge medications.  Relevant Imaging Results:  Relevant Lab Results:   Additional Information SSN: [id number]  Abigail Butts, LCSW

## 2018-01-05 NOTE — Progress Notes (Signed)
Palliative Medicine consult noted. Due to high referral volume, there may be a delay seeing this patient. Please call the Palliative Medicine Team office at [phone] if recommendations are needed in the interim.  Thank you for inviting [place] to see this patient.  Margret Chance Odilon Cass, RN, BSN, [facility] Palliative Medicine Team 01/05/2018 3:32 PM Office [phone]

## 2018-01-05 NOTE — Evaluation (Signed)
Clinical/Bedside Swallow Evaluation Patient Details  Name: Alyssa Cameron MRN: [id number] Date of Birth: 12/04/1918  Today's Date: 01/05/2018 Time: SLP Start Time (ACUTE ONLY): 1337 SLP Stop Time (ACUTE ONLY): 1335 SLP Time Calculation (min) (ACUTE ONLY): 1438 min  Past Medical History:  Past Medical History:  Diagnosis Date  . A-fib (HCC)   . Arthritis   . CHF (congestive heart failure) (HCC)   . Diverticulitis   . Frequent UTI   . History of colon polyps   . Hx of colonic polyps    2013 - 2 tubular adenoma sigmoid and Transverse polyp  . Hypertension   . Internal hemorrhoids   . Pulmonary hypertension (HCC) 01/07/2015  . Spinal stenosis   . Urine incontinence    Past Surgical History:  Past Surgical History:  Procedure Laterality Date  . APPENDECTOMY  1941  . BREAST BIOPSY  2010  . CATARACT EXTRACTION    . GALLBLADDER SURGERY  1970  . ORIF FEMUR FRACTURE Right 09/01/2017   Procedure: OPEN REDUCTION INTERNAL FIXATION (ORIF) DISTAL FEMUR FRACTURE;  Surgeon: Myrene Galas, MD;  Location: [facility] OR;  Service: Orthopedics;  Laterality: Right;  . TONSILLECTOMY AND ADENOIDECTOMY  1941  . VAGINAL HYSTERECTOMY     HPI:  84 y.o. female with medical history significant of hypertension, diastolic CHF, CKD, anemia due to CKD, atrial fibrillation on Eliquis, R femur ORIF 08/2017, recent pneumonia and admitted for SIRS, evaluation of rectus sheath hematoma and mild acute hypoxic toxic respiratory failure secondary to acute decompensated diastolic heart failure   Assessment / Plan / Recommendation Clinical Impression  Pt has a baseline, congested cough that is heard intermittently during PO intake. Although this makes clinical assessment difficult, it does not appear to directly correlate with swallowing or with any particular consistency. Pt did need a liquid wash to clear the dry graham cracker from her mouth, and she does not like to sit fully upright for long. She and her family deny any h/o  dysphagia, signs of aspiration at baseline, or any h/o PNA. Pt/family are in agreeent with continuing current diet for now with use of general precautions, holding any further instrumental testing at this time given no known difficulties PTA. SLP to f/u briefly for tolerance. SLP Visit Diagnosis: Dysphagia, unspecified (R13.10)    Aspiration Risk  Mild aspiration risk    Diet Recommendation Dysphagia 3 (Mech soft);Thin liquid   Liquid Administration via: Cup;Straw Medication Administration: Whole meds with liquid Supervision: Patient able to self feed;Intermittent supervision to cue for compensatory strategies Compensations: Slow rate;Small sips/bites Postural Changes: Seated upright at 90 degrees    Other  Recommendations Oral Care Recommendations: Oral care BID   Follow up Recommendations None      Frequency and Duration min 1 x/week  1 week       Prognosis        Swallow Study   General HPI: 84 y.o. female with medical history significant of hypertension, diastolic CHF, CKD, anemia due to CKD, atrial fibrillation on Eliquis, R femur ORIF 08/2017, recent pneumonia and admitted for SIRS, evaluation of rectus sheath hematoma and mild acute hypoxic toxic respiratory failure secondary to acute decompensated diastolic heart failure Type of Study: Bedside Swallow Evaluation Previous Swallow Assessment: none in chart Diet Prior to this Study: Dysphagia 3 (soft);Thin liquids Temperature Spikes Noted: No Respiratory Status: Nasal cannula History of Recent Intubation: No Behavior/Cognition: Alert;Cooperative;Pleasant mood Oral Cavity Assessment: Within Functional Limits Oral Care Completed by SLP: No Oral Cavity - Dentition: Adequate natural dentition;Missing  dentition Vision: Functional for self-feeding Self-Feeding Abilities: Able to feed self Patient Positioning: Upright in bed Baseline Vocal Quality: Normal Volitional Cough: Strong;Congested Volitional Swallow: Able to elicit     Oral/Motor/Sensory Function Overall Oral Motor/Sensory Function: Within functional limits   Ice Chips Ice chips: Not tested   Thin Liquid Thin Liquid: Impaired Presentation: Self Fed;Straw Pharyngeal  Phase Impairments: Cough - Delayed    Nectar Thick Nectar Thick Liquid: Not tested   Honey Thick Honey Thick Liquid: Not tested   Puree Puree: Impaired Presentation: Self Fed Pharyngeal Phase Impairments: Cough - Delayed   Solid     Solid: Impaired Presentation: Self Fed Oral Phase Functional Implications: Oral residue Pharyngeal Phase Impairments: Cough - Delayed      Maxcine Ham 01/05/2018,1:48 PM  Maxcine Ham, M.A. CCC-SLP Acute Herbalist [phone] Office [phone]

## 2018-01-06 LAB — BASIC METABOLIC PANEL
ANION GAP: 9 (ref 5–15)
BUN: 20 mg/dL (ref 8–23)
CO2: 25 mmol/L (ref 22–32)
Calcium: 8.8 mg/dL — ABNORMAL LOW (ref 8.9–10.3)
Chloride: 101 mmol/L (ref 98–111)
Creatinine, Ser: 1.13 mg/dL — ABNORMAL HIGH (ref 0.44–1.00)
GFR calc Af Amer: 46 mL/min — ABNORMAL LOW (ref >60)
GFR, EST NON AFRICAN AMERICAN: 40 mL/min — AB (ref >60)
Glucose, Bld: 120 mg/dL — ABNORMAL HIGH (ref 70–99)
POTASSIUM: 3.4 mmol/L — AB (ref 3.5–5.1)
SODIUM: 135 mmol/L (ref 135–145)

## 2018-01-06 LAB — CBC
HCT: 29.5 % — ABNORMAL LOW (ref 36.0–46.0)
HEMOGLOBIN: 9 g/dL — AB (ref 12.0–15.0)
MCH: 30.2 pg (ref 26.0–34.0)
MCHC: 30.5 g/dL (ref 30.0–36.0)
MCV: 99 fL (ref 80.0–100.0)
Platelets: 223 10*3/uL (ref 150–400)
RBC: 2.98 MIL/uL — AB (ref 3.87–5.11)
RDW: 16.4 % — ABNORMAL HIGH (ref 11.5–15.5)
WBC: 12.3 10*3/uL — ABNORMAL HIGH (ref 4.0–10.5)
nRBC: 0 % (ref 0.0–0.2)

## 2018-01-06 MED ORDER — LORAZEPAM 0.5 MG PO TABS
0.5000 mg | ORAL_TABLET | Freq: Once | ORAL | Status: AC
  Administered 2018-01-06: 0.5 mg via ORAL
  Filled 2018-01-06: qty 1

## 2018-01-06 MED ORDER — AMOXICILLIN-POT CLAVULANATE 500-125 MG PO TABS
1.0000 | ORAL_TABLET | Freq: Two times a day (BID) | ORAL | Status: AC
  Administered 2018-01-06 – 2018-01-07 (×2): 500 mg via ORAL
  Filled 2018-01-06 (×3): qty 1

## 2018-01-06 MED ORDER — POTASSIUM CHLORIDE CRYS ER 20 MEQ PO TBCR
40.0000 meq | EXTENDED_RELEASE_TABLET | Freq: Once | ORAL | Status: AC
  Administered 2018-01-06: 40 meq via ORAL
  Filled 2018-01-06: qty 2

## 2018-01-06 NOTE — Progress Notes (Signed)
PROGRESS NOTE    Alyssa Cameron  RUE:[id number] DOB: December 26, 1918 DOA: 01/02/2018 PCP: Shirline Frees, NP    Brief Narrative:  Alyssa Cameron a 84 y.o.femalewith medical history significant ofhypertension, diastolic CHF, CKD, anemia due to CKD, atrial fibrillation on Eliquis, recent pneumonia presenting to the hospital from Nursing home with cough, congestion, low-grade fevers for the past 4 days.   Temperature 100.5 F, tachycardic, tachypnea on arrival..Not hypotensive on admission. Blood glucose 157. Labs showing no leukocytosis.Lactic acid normal. Hemoglobin 7.6, repeat 8.2(baseline 7-8).BNP 446. I-STAT troponin negative. EKG showing A. fib with RVR (heart rate 135).CT angios chest/abdomen/pelvis CT angios chest/abdomen/pelvis showing moderate to large rectus sheath hematoma without evidence of active extravasation. Alsoshowing small to moderate bilateral pleural effusions andmild pulmonary edema.   Pt admitted to [facility] for evaluation of rectus sheath hematoma and mild acute hypoxic toxic respiratory failure secondary to acute decompensated diastolic heart failure.   Assessment & Plan:   Principal Problem:   SIRS (systemic inflammatory response syndrome) (HCC) Active Problems:   HTN (hypertension)   Acute encephalopathy   Acute hypoxemic respiratory failure (HCC)   Acute diastolic (congestive) heart failure (HCC)   Atrial fibrillation with RVR (HCC)   Rectus sheath hematoma   Chronic anemia   CKD (chronic kidney disease) stage 3, GFR 30-59 ml/min (HCC)   Elevated alkaline phosphatase level   Gout   GERD (gastroesophageal reflux disease)   Depression   Acute hypoxemic respiratory failure secondary to acute on chronic systolic and diastolic heart failure:  Slowly diuresing with low dose lasix , Diuresed only about 0.9 lit yesterday, continue with strict intake and output.  Resume the low dose lasix and monitor renal parameters and electrolytes while on lasix.    Echocardiogram reviewed and discussed with the patient and family. Repeat CXR continues to show interstitial edema. She is currently on 3 lit of [place] oxygen.  Discussed in detail regarding the patient's prognosis , and family requested to speak with palliative care consult for goals of care.   On exam pt continues to have rales and coughing. Would continue the low dose lasix and await palliative care consultation.    SIRS:  Fevers resolved. Her wbc count remains wnl.  Lactic acid is wnl.  CT angio shows diffuse pulm edema with mod effusions.  Currently on broad spectrum IV antibiotics, no fever, will transition to oral antibiotics. SLP evaluation recommending dysphagia 3 diet.   Blood cultures have been negative so far.  UA does not indicate infection.    Acute encephalopathy:  Suspect metabolic from fever and fluid overload.  She is alert and oriented at this time.   Rectus sheath hematoma:  Spontaneous. Pt denies any pain.  Anti coagulation on hold for now. We will hold that now that family wants GOC meeting with palliative care. No drop in hemoglobin.    Atrial fib with RVR Rate is better controlled. Transitioned off cardizem gtt and started her on oral metoprolol. We have to watch her rate while working with PT.    Acute anemia of blood loss from rectal sheath hematoma and Anemia of chronic disease: - transfuse 1 unit of prbc transfusion followed by lasix 1v 20 Mg once dose.  - Hemoglobin is stable around 9.   GERD Stable.    Stage 3 CKD:  Creatinine between 1.1 to 1.2 , monitor on IV lasix.  Creatinine stable.    Hypertension: well controlled.   Hypokalemia Replaced.    DVT prophylaxis: SCD'S for now.  Code Status: DNR  Family Communication: discussed with both daughters at bedside.   Disposition Plan: discharge to SNF on  Monday   Consultants:   Palliative care consult.    Procedures: none.    Antimicrobials: vancomycin and cefepime since admission.     Subjective: She is teary telling us she does not want any surgery.  She denies any pain or chest discomfort. She continues to cough while talking. Dry cough.   Objective: Vitals:   01/05/18 1148 01/05/18 2117 01/06/18 0800 01/06/18 1038  BP: 121/63 113/85  130/69  Pulse: 77 84  77  Resp: 19  (!) 22 (!) 21  Temp: 98.2 F (36.8 C)     TempSrc: Oral     SpO2: 99%  98% 93%  Weight:      Height:        Intake/Output Summary (Last 24 hours) at 01/06/2018 1923 Last data filed at 01/06/2018 0724 Gross per 24 hour  Intake -  Output 900 ml  Net -900 ml   Filed Weights   01/03/18 0120 01/03/18 1631 01/05/18 0628  Weight: 84 kg 81.3 kg 80.2 kg    Examination:  General exam: on 2 to 3 lit of [place] oxygen. Not in distress.  Respiratory system: rales on exam. No wheezing or rhonchi.  Cardiovascular system: S1 & S2 heard, irregular, no JVD, trace edema.  Gastrointestinal system: Abdomen is soft, non tender non distended bowel sounds.  Central nervous system: Alert and oriented. Able to move all extremities .  Extremities: Symmetric 5 x 5 power. No cyanosis or clubbing.  Skin: No rashes, lesions or ulcers Psychiatry:  Mood & affect appropriate.     Data Reviewed: I have personally reviewed following labs and imaging studies  CBC: Recent Labs  Lab 01/02/18 2310 01/02/18 2314 01/03/18 0450 01/04/18 0812 01/05/18 1000 01/06/18 0514  WBC 10.3  --  9.1 8.6 13.5* 12.3*  NEUTROABS 8.4*  --   --   --   --   --   HGB 7.6* 8.2* 7.1* 7.0* 8.6* 9.0*  HCT 24.5* 24.0* 23.4* 22.9* 28.0* 29.5*  MCV 101.7*  --  104.0* 101.8* 98.9 99.0  PLT 215  --  198 191 214 223   Basic Metabolic Panel: Recent Labs  Lab 01/02/18 2310 01/02/18 2314 01/03/18 0450 01/04/18 0812 01/05/18 1000 01/06/18 0514  NA 134* 136 136 135 134* 135  K 3.7 3.7 3.7 3.5 3.3* 3.4*  CL 102 99 103 102 100 101  CO2 25  --  24 27 26 25   GLUCOSE 157* 155* 139* 108* 220* 120*  BUN 30* 30* 27* 24* 23 20  CREATININE  1.09* 1.10* 1.12* 1.21* 1.27* 1.13*  CALCIUM 8.8*  --  8.7* 8.8* 8.6* 8.8*  MG  --   --   --  1.8  --   --    GFR: Estimated Creatinine Clearance: 31.1 mL/min (A) (by C-G formula based on SCr of 1.13 mg/dL (H)). Liver Function Tests: Recent Labs  Lab 01/02/18 2310  AST 17  ALT 13  ALKPHOS 143*  BILITOT 1.1  PROT 5.2*  ALBUMIN 2.7*   No results for input(s): LIPASE, AMYLASE in the last 168 hours. No results for input(s): AMMONIA in the last 168 hours. Coagulation Profile: Recent Labs  Lab 01/02/18 2310  INR 1.55   Cardiac Enzymes: No results for input(s): CKTOTAL, CKMB, CKMBINDEX, TROPONINI in the last 168 hours. BNP (last 3 results) No results for input(s): PROBNP in the last 8760 hours. HbA1C: No results for  input(s): HGBA1C in the last 72 hours. CBG: No results for input(s): GLUCAP in the last 168 hours. Lipid Profile: No results for input(s): CHOL, HDL, LDLCALC, TRIG, CHOLHDL, LDLDIRECT in the last 72 hours. Thyroid Function Tests: No results for input(s): TSH, T4TOTAL, FREET4, T3FREE, THYROIDAB in the last 72 hours. Anemia Panel: No results for input(s): VITAMINB12, FOLATE, FERRITIN, TIBC, IRON, RETICCTPCT in the last 72 hours. Sepsis Labs: Recent Labs  Lab 01/02/18 2314  LATICACIDVEN 1.22    Recent Results (from the past 240 hour(s))  Blood Culture (routine x 2)     Status: None (Preliminary result)   Collection Time: 01/03/18  1:30 AM  Result Value Ref Range Status   Specimen Description BLOOD RIGHT FOREARM  Final   Special Requests   Final    BOTTLES DRAWN AEROBIC AND ANAEROBIC Blood Culture adequate volume   Culture   Final    NO GROWTH 3 DAYS Performed at [facility], [address], [place], [place] [address]    Report Status PENDING  Incomplete  Blood Culture (routine x 2)     Status: None (Preliminary result)   Collection Time: 01/03/18  1:38 AM  Result Value Ref Range Status   Specimen Description BLOOD RIGHT FOREARM  Final   Special  Requests   Final    BOTTLES DRAWN AEROBIC AND ANAEROBIC Blood Culture results may not be optimal due to an inadequate volume of blood received in culture bottles   Culture   Final    NO GROWTH 3 DAYS Performed at [facility], [address] [place], [place] [address]    Report Status PENDING  Incomplete  Culture, Urine     Status: Abnormal   Collection Time: 01/03/18  4:50 AM  Result Value Ref Range Status   Specimen Description URINE, RANDOM  Final   Special Requests NONE  Final   Culture (A)  Final    <10,000 COLONIES/mL INSIGNIFICANT GROWTH Performed at [facility], [address], [place], [place] [address]    Report Status 01/04/2018 FINAL  Final  MRSA PCR Screening     Status: None   Collection Time: 01/03/18  4:16 PM  Result Value Ref Range Status   MRSA by PCR NEGATIVE NEGATIVE Final    Comment:        The GeneXpert MRSA Assay (FDA approved for NASAL specimens only), is one component of a comprehensive MRSA colonization surveillance program. It is not intended to diagnose MRSA infection nor to guide or monitor treatment for MRSA infections. Performed at [facility], [address], [place], [place] [address]          Radiology Studies: Dg Chest Port 1 View  Result Date: 01/05/2018 CLINICAL DATA:  Evaluate for pulmonary edema.  Cough and congestion. EXAM: PORTABLE CHEST 1 VIEW COMPARISON:  CT 01/02/2018.  Chest x-ray 09/04/2017. FINDINGS: Cardiomegaly with progressive bilateral pulmonary interstitial prominence suggesting congestive heart failure with interstitial edema. Bilateral mid lung field subsegmental atelectasis. Small bilateral pleural effusions. No pneumothorax. IMPRESSION: 1. Cardiomegaly with progressive bilateral pulmonary interstitial prominence suggesting congestive heart failure with interstitial edema. Small bilateral pleural effusions. 2.  Bilateral mid lung field subsegmental atelectasis. Electronically Signed   By: Maisie Fus   Register   On: 01/05/2018 13:27        Scheduled Meds: . allopurinol  200 mg Oral Daily  . amoxicillin-clavulanate  1 tablet Oral BID  . dextromethorphan-guaiFENesin  1 tablet Oral BID  . furosemide  20 mg Intravenous Daily  . metoprolol tartrate  25 mg Oral BID  . pantoprazole  40 mg Oral Daily  . PARoxetine  10 mg Oral Daily   Continuous Infusions:    LOS: 3 days    Time spent: 25 minutes.     Kathlen Mody, MD [facility] Pager [phone]  If 7PM-7AM, please contact night-coverage [email or web address] Password [facility] 01/06/2018, 7:23 PM

## 2018-01-06 NOTE — Progress Notes (Signed)
Pharmacy Antibiotic Note  Alyssa Cameron is a 84 y.o. female admitted on 01/02/2018 with sepsis.  Pharmacy has been consulted for Vancomycin/Cefepime dosing - day #4. Afebrile, WBC down to 12.3. SCr relatively stable at 1.13.  Plan: Vancomycin 750 mg IV q24h Cefepime 1g IV q24h Monitor clinical progress, c/s, renal function F/u de-escalation plan/LOT, vancomycin trough prior to next dose if continuing - messaged MD for plan   Height: 5\' 6"  (167.6 cm) Weight: 176 lb 12.9 oz (80.2 kg) IBW/kg (Calculated) : 59.3  No data recorded.  Recent Labs  Lab 01/02/18 2310 01/02/18 2314 01/03/18 0450 01/04/18 0812 01/05/18 1000 01/06/18 0514  WBC 10.3  --  9.1 8.6 13.5* 12.3*  CREATININE 1.09* 1.10* 1.12* 1.21* 1.27* 1.13*  LATICACIDVEN  --  1.22  --   --   --   --     Estimated Creatinine Clearance: 31.1 mL/min (A) (by C-G formula based on SCr of 1.13 mg/dL (H)).    Allergies  Allergen Reactions  . Demerol [Meperidine] Nausea Only  . Vicodin [Hydrocodone-Acetaminophen] Nausea And Vomiting    Babs Bertin, PharmD, BCPS Clinical Pharmacist Clinical phone [phone] Please check AMION for all [facility] contact numbers 01/06/2018 1:32 PM

## 2018-01-07 DIAGNOSIS — F411 Generalized anxiety disorder: Secondary | ICD-10-CM

## 2018-01-07 DIAGNOSIS — I5031 Acute diastolic (congestive) heart failure: Secondary | ICD-10-CM

## 2018-01-07 DIAGNOSIS — I4891 Unspecified atrial fibrillation: Secondary | ICD-10-CM

## 2018-01-07 DIAGNOSIS — Z7189 Other specified counseling: Secondary | ICD-10-CM

## 2018-01-07 DIAGNOSIS — S301XXA Contusion of abdominal wall, initial encounter: Secondary | ICD-10-CM

## 2018-01-07 DIAGNOSIS — J810 Acute pulmonary edema: Secondary | ICD-10-CM

## 2018-01-07 DIAGNOSIS — Z515 Encounter for palliative care: Secondary | ICD-10-CM

## 2018-01-07 DIAGNOSIS — J9601 Acute respiratory failure with hypoxia: Secondary | ICD-10-CM

## 2018-01-07 DIAGNOSIS — G9340 Encephalopathy, unspecified: Secondary | ICD-10-CM

## 2018-01-07 LAB — TYPE AND SCREEN
ABO/RH(D): O POS
ANTIBODY SCREEN: NEGATIVE
Unit division: 0
Unit division: 0

## 2018-01-07 LAB — BPAM RBC
BLOOD PRODUCT EXPIRATION DATE: 201911222359
Blood Product Expiration Date: 201911182359
ISSUE DATE / TIME: 201910241209
UNIT TYPE AND RH: 5100
Unit Type and Rh: 5100

## 2018-01-07 MED ORDER — ONDANSETRON 4 MG PO TBDP
4.0000 mg | ORAL_TABLET | Freq: Three times a day (TID) | ORAL | Status: DC | PRN
  Filled 2018-01-07: qty 1

## 2018-01-07 MED ORDER — MORPHINE SULFATE (CONCENTRATE) 10 MG/0.5ML PO SOLN: 5.0000 mg | ORAL | Status: DC | PRN

## 2018-01-07 MED ORDER — LORAZEPAM 0.5 MG PO TABS
0.5000 mg | ORAL_TABLET | Freq: Three times a day (TID) | ORAL | Status: DC | PRN
  Administered 2018-01-07 – 2018-01-08 (×3): 0.5 mg via ORAL
  Filled 2018-01-07 (×3): qty 1

## 2018-01-07 NOTE — Progress Notes (Signed)
PROGRESS NOTE    Alyssa Cameron  ZOX:[id number] DOB: Dec 31, 1918 DOA: 01/02/2018 PCP: Shirline Frees, NP    Brief Narrative:  Alyssa Cameron a 84 y.o.femalewith medical history significant ofhypertension, diastolic CHF, CKD, anemia due to CKD, atrial fibrillation on Eliquis, recent pneumonia presenting to the hospital from Nursing home with cough, congestion, low-grade fevers for the past 4 days.   Temperature 100.5 F, tachycardic, tachypnea on arrival..Not hypotensive on admission. Blood glucose 157. Labs showing no leukocytosis.Lactic acid normal. Hemoglobin 7.6, repeat 8.2(baseline 7-8).BNP 446. I-STAT troponin negative. EKG showing A. fib with RVR (heart rate 135).CT angios chest/abdomen/pelvis CT angios chest/abdomen/pelvis showing moderate to large rectus sheath hematoma without evidence of active extravasation. Alsoshowing small to moderate bilateral pleural effusions andmild pulmonary edema.   Pt admitted to [facility] for evaluation of rectus sheath hematoma and mild acute hypoxic toxic respiratory failure secondary to acute decompensated diastolic heart failure.  Due to multiple comorbidities patient's daughters requested for palliative care consult and after discussion with the patient and family they have transition her to comfort measures at this time.   Assessment & Plan:   Principal Problem:   SIRS (systemic inflammatory response syndrome) (HCC) Active Problems:   HTN (hypertension)   Acute encephalopathy   Acute hypoxemic respiratory failure (HCC)   Acute diastolic (congestive) heart failure (HCC)   Atrial fibrillation with RVR (HCC)   Rectus sheath hematoma, initial encounter   Chronic anemia   CKD (chronic kidney disease) stage 3, GFR 30-59 ml/min (HCC)   Elevated alkaline phosphatase level   Gout   GERD (gastroesophageal reflux disease)   Depression   Palliative care by specialist   Goals of care, counseling/discussion   Acute pulmonary edema (HCC)   Anxiety state   Acute hypoxemic respiratory failure secondary to acute on chronic systolic and diastolic heart failure:  Slowly diuresing with low dose lasix ,Resume the low dose lasix without any escalation of care at this time.  Echocardiogram reviewed and discussed with the patient and family. Repeat CXR continues to show interstitial edema. She is currently on 3 lit of [place] oxygen.  Discussed in detail regarding the patient's prognosis , and family requested to speak with palliative care consult for goals of care and after discussion they have decided to transition to comfort measures.    SIRS:  Fevers resolved. Her wbc count remains wnl.  Lactic acid is wnl.  CT angio shows diffuse pulm edema with mod effusions.  He was started on [place] spectrum IV antibiotics, no fever, transition to oral antibiotics and discontinued them today.Marland Kitchen SLP evaluation recommending dysphagia 3 diet.   Blood cultures have been negative so far.  UA does not indicate infection.    Acute encephalopathy:  Suspect metabolic from fever and fluid overload.  She is alert and oriented at this time.   Rectus sheath hematoma:  Spontaneous. Pt denies any pain.  Coagulation discontinued.   Atrial fib with RVR Rate is better controlled. Transitioned off cardizem gtt and started her on oral metoprolol.  No escalation of care at this time.  Acute anemia of blood loss from rectal sheath hematoma and Anemia of chronic disease: - transfuse 1 unit of prbc transfusion followed by lasix 1v 20 Mg once dose.  - Hemoglobin is stable around 9.  Patient and family would like to stop blood checks at this time.  GERD Stable.    Stage 3 CKD:  Creatinine between 1.1 to 1.2 , stable   Hypertension: well controlled.   Hypokalemia Replaced.  DVT prophylaxis: SCD'S for now.  Code Status: DNR Family Communication: None at bedside today Disposition Plan: discharge to SNF with hospice tomorrow  Consultants:    Palliative care consult.    Procedures: none.    Antimicrobials: None   Subjective: Transition to comfort measures she appears comfortable at this time.  Objective: Vitals:   01/07/18 0012 01/07/18 0443 01/07/18 0800 01/07/18 1200  BP:      Pulse:    (!) 104  Resp:   19 15  Temp: 98.2 F (36.8 C)     TempSrc: Oral     SpO2:    99%  Weight:  80.3 kg    Height:        Intake/Output Summary (Last 24 hours) at 01/07/2018 1711 Last data filed at 01/07/2018 1500 Gross per 24 hour  Intake 480 ml  Output 1750 ml  Net -1270 ml   Filed Weights   01/03/18 1631 01/05/18 0628 01/07/18 0443  Weight: 81.3 kg 80.2 kg 80.3 kg    Examination:  General exam: on 2 to 3 lit of [place] oxygen. Not in distress.  Respiratory system: Tenuous to have rails on exam, no wheezing or rhonchi Cardiovascular system: S1 & S2 heard, irregular, no JVD, trace edema.  Gastrointestinal system: Abdomen is soft, non tender non distended bowel sounds.  Central nervous system: Alert and comfortable able to move her extremities Extremities: No cyanosis or clubbing Skin: No rashes, lesions or ulcers Psychiatry: Calm and comfortable    Data Reviewed: I have personally reviewed following labs and imaging studies  CBC: Recent Labs  Lab 01/02/18 2310 01/02/18 2314 01/03/18 0450 01/04/18 0812 01/05/18 1000 01/06/18 0514  WBC 10.3  --  9.1 8.6 13.5* 12.3*  NEUTROABS 8.4*  --   --   --   --   --   HGB 7.6* 8.2* 7.1* 7.0* 8.6* 9.0*  HCT 24.5* 24.0* 23.4* 22.9* 28.0* 29.5*  MCV 101.7*  --  104.0* 101.8* 98.9 99.0  PLT 215  --  198 191 214 223   Basic Metabolic Panel: Recent Labs  Lab 01/02/18 2310 01/02/18 2314 01/03/18 0450 01/04/18 0812 01/05/18 1000 01/06/18 0514  NA 134* 136 136 135 134* 135  K 3.7 3.7 3.7 3.5 3.3* 3.4*  CL 102 99 103 102 100 101  CO2 25  --  24 27 26 25   GLUCOSE 157* 155* 139* 108* 220* 120*  BUN 30* 30* 27* 24* 23 20  CREATININE 1.09* 1.10* 1.12* 1.21* 1.27* 1.13*   CALCIUM 8.8*  --  8.7* 8.8* 8.6* 8.8*  MG  --   --   --  1.8  --   --    GFR: Estimated Creatinine Clearance: 31.1 mL/min (A) (by C-G formula based on SCr of 1.13 mg/dL (H)). Liver Function Tests: Recent Labs  Lab 01/02/18 2310  AST 17  ALT 13  ALKPHOS 143*  BILITOT 1.1  PROT 5.2*  ALBUMIN 2.7*   No results for input(s): LIPASE, AMYLASE in the last 168 hours. No results for input(s): AMMONIA in the last 168 hours. Coagulation Profile: Recent Labs  Lab 01/02/18 2310  INR 1.55   Cardiac Enzymes: No results for input(s): CKTOTAL, CKMB, CKMBINDEX, TROPONINI in the last 168 hours. BNP (last 3 results) No results for input(s): PROBNP in the last 8760 hours. HbA1C: No results for input(s): HGBA1C in the last 72 hours. CBG: No results for input(s): GLUCAP in the last 168 hours. Lipid Profile: No results for input(s): CHOL, HDL, LDLCALC,  TRIG, CHOLHDL, LDLDIRECT in the last 72 hours. Thyroid Function Tests: No results for input(s): TSH, T4TOTAL, FREET4, T3FREE, THYROIDAB in the last 72 hours. Anemia Panel: No results for input(s): VITAMINB12, FOLATE, FERRITIN, TIBC, IRON, RETICCTPCT in the last 72 hours. Sepsis Labs: Recent Labs  Lab 01/02/18 2314  LATICACIDVEN 1.22    Recent Results (from the past 240 hour(s))  Blood Culture (routine x 2)     Status: None (Preliminary result)   Collection Time: 01/03/18  1:30 AM  Result Value Ref Range Status   Specimen Description BLOOD RIGHT FOREARM  Final   Special Requests   Final    BOTTLES DRAWN AEROBIC AND ANAEROBIC Blood Culture adequate volume   Culture   Final    NO GROWTH 4 DAYS Performed at [facility], [address], [place], [place] [address]    Report Status PENDING  Incomplete  Blood Culture (routine x 2)     Status: None (Preliminary result)   Collection Time: 01/03/18  1:38 AM  Result Value Ref Range Status   Specimen Description BLOOD RIGHT FOREARM  Final   Special Requests   Final    BOTTLES DRAWN  AEROBIC AND ANAEROBIC Blood Culture results may not be optimal due to an inadequate volume of blood received in culture bottles   Culture   Final    NO GROWTH 4 DAYS Performed at [facility], [address] [place], [place] [address]    Report Status PENDING  Incomplete  Culture, Urine     Status: Abnormal   Collection Time: 01/03/18  4:50 AM  Result Value Ref Range Status   Specimen Description URINE, RANDOM  Final   Special Requests NONE  Final   Culture (A)  Final    <10,000 COLONIES/mL INSIGNIFICANT GROWTH Performed at [facility], [address] [place], [place] [address]    Report Status 01/04/2018 FINAL  Final  MRSA PCR Screening     Status: None   Collection Time: 01/03/18  4:16 PM  Result Value Ref Range Status   MRSA by PCR NEGATIVE NEGATIVE Final    Comment:        The GeneXpert MRSA Assay (FDA approved for NASAL specimens only), is one component of a comprehensive MRSA colonization surveillance program. It is not intended to diagnose MRSA infection nor to guide or monitor treatment for MRSA infections. Performed at [facility], [address] [place], [place] [address]          Radiology Studies: No results found.      Scheduled Meds: . amoxicillin-clavulanate  1 tablet Oral BID  . dextromethorphan-guaiFENesin  1 tablet Oral BID  . furosemide  20 mg Intravenous Daily  . metoprolol tartrate  25 mg Oral BID  . pantoprazole  40 mg Oral Daily  . PARoxetine  10 mg Oral Daily   Continuous Infusions:    LOS: 4 days    Time spent: 25 minutes.     Kathlen Mody, MD [facility] Pager [phone]  If 7PM-7AM, please contact night-coverage [email or web address] Password [email or web address] 01/07/2018, 5:11 PM

## 2018-01-07 NOTE — Consult Note (Signed)
Consultation Note Date: 01/07/2018   Patient Name: Alyssa Cameron  DOB: 10/25/1918  MRN: [id number]  Age / Sex: 84 y.o., female  PCP: Dorothyann Peng, NP Referring Physician: Hosie Poisson, MD  Reason for Consultation: Establishing goals of care  HPI/Patient Profile: 84 y.o. female  with past medical history of spinal stenosis, arthritis, afib on eliquis, diastolic CHF, pulmonary hypertension, hypertension, colon polyps, diverticulitis, CKD admitted on 01/02/2018 with cough, congestion, fever, and left flank bruise. In ED, CT abdomen/pelvis revealed moderate to large rectus sheath hematoma without evidence of active extravasation. Also revealed small to moderate bilateral pleural effusions and mild pulmonary edema. Patient admitted for management of acute hypoxic respiratory failure secondary to acute decompensated diastolic heart failure. Palliative medicine consultation for goals of care.   Clinical Assessment and Goals of Care:  I have reviewed medical records, discussed with Dr. Karleen Hampshire, and met with patient's three children Jenny Reichmann, Fraser Din, and Hitchcock) in family waiting room to discuss diagnosis, prognosis, GOC, EOL wishes, disposition and options.  Patient wakes to voice but remains pleasantly confused. Denies pain or discomfort during my visit. Appears slightly anxious. DIL stays at bedside during my meeting with her children.   Introduced Palliative Medicine as specialized medical care for people living with serious illness. It focuses on providing relief from the symptoms and stress of a serious illness. The goal is to improve quality of life for both the patient and the family.  We discussed a brief life review of the patient. Widowed. Four adult children. Lived up [place] ([place] and [place]) majority of her life. She moved down to [place] after the death of her husband, to be closer to daughter Jenny Reichmann. The patient lived  and was doing well in her independent living facility up until fall with hip fracture this past June. Since this fall, family speaks of her declining functional, cognitive, and nutritional status. She has not been able to walk since the fall and has been wheelchair bound. Cognitively, she is "not as sharp" and frequently confused and anxious.   Discussed events leading up to hospitalization and course of hospitalization including diagnoses and interventions. Discussed high mortality in elderly after a fall/hip fracture. Family understands and has been preparing themselves since they have already seen a great decline in her health since fall. Discussed ongoing heart failure and fluid in the lungs.   I attempted to elicit values and goals of care important to the family. Daughters tearful during the conversation but agree that "comfort" is the main goal for their mother. They share that she has "lived a good life" and blessed with how many years they have had with her. Children speak of relief from anxiety with ativan and wish to further utilize ativan when needed to ensure she is not anxious. She does not like being alone.   The difference between aggressive medical intervention and comfort care was considered in light of the patient's goals of care.   Advanced directives, concepts specific to code status, artifical feeding and hydration, and rehospitalization were considered and  discussed. Children confirm DNR/DNI code status and wishes against heroic measures at EOL.   Introduced hospice options and philosophy. Discussed transition to comfort focused care in order to allow comfort, peace, and dignity at EOL. Educated on EOL expectations. Discussed using medications as needed for symptom management and prevent recurrent hospitalization. Children agree that it makes her anxious coming back and forth to the hospital. Children are interested in initiating hospice services at SNF on discharge, understanding  hospice philosophy. Answered questions and concerns regarding plan of care.   [facility] copy left for review. PMT contact information given.     SUMMARY OF RECOMMENDATIONS    DNR/DNI. Adult children confirm their mother's wishes against heroic measures at EOL and goal of "comfort." No escalation of care inpatient.  Shift to comfort measures with initiation of hospice services at SNF on discharge. Long-term care resident at [facility]. SW notified.   Symptom management--see below.   Comfort feeds per patient/family request.   Code Status/Advance Care Planning:  DNR  Symptom Management:   Ativan 0.36m PO q8h prn anxiety  Roxanol 547mSL q4h prn pain/dyspnea  Zofran 88m11mO q8h prn nausea/vomiting  Palliative Prophylaxis:   Aspiration, Delirium Protocol, Frequent Pain Assessment, Oral Care and Turn Reposition  Additional Recommendations (Limitations, Scope, Preferences):  Full Comfort Care  Psycho-social/Spiritual:   Desire for further Chaplaincy support:yes  Additional Recommendations: Caregiving  Support/Resources, Compassionate Wean Education and Education on Hospice  Prognosis:   Likely months if not weeks with declining functional/cognitive/nutritional status following a fall/femur fracture, diastolic CHF, afb RVR, CKD, and rectus sheath hematoma.  Discharge Planning: [facility]      Primary Diagnoses: Present on Admission: . SIRS (systemic inflammatory response syndrome) (HCC)   I have reviewed the medical record, interviewed the patient and family, and examined the patient. The following aspects are pertinent.  Past Medical History:  Diagnosis Date  . [place] ([facility] . Arthritis   . CHF (congestive heart failure) ([facility] . Diverticulitis   . Frequent UTI   . History of colon polyps   . Hx of colonic polyps    2013 - 2 tubular adenoma sigmoid and Transverse polyp  . Hypertension   . Internal hemorrhoids   . Pulmonary  hypertension ([facility]/26[facility]2016  . Spinal stenosis   . Urine incontinence    Social History   Socioeconomic History  . Marital status: Widowed    Spouse name: Not on file  . Number of children: Not on file  . Years of education: Not on file  . Highest education level: Not on file  Occupational History  . Not on file  Social Needs  . Financial resource strain: Not on file  . Food insecurity:    Worry: Not on file    Inability: Not on file  . Transportation needs:    Medical: Not on file    Non-medical: Not on file  Tobacco Use  . Smoking status: Never Smoker  . Smokeless tobacco: Never Used  Substance and Sexual Activity  . Alcohol use: No    Alcohol/week: 0.0 standard drinks  . Drug use: No  . Sexual activity: Not on file  Lifestyle  . Physical activity:    Days per week: Not on file    Minutes per session: Not on file  . Stress: Not on file  Relationships  . Social connections:    Talks on phone: Not on file    Gets together: Not on file  Attends religious service: Not on file    Active member of club or organization: Not on file    Attends meetings of clubs or organizations: Not on file    Relationship status: Not on file  Other Topics Concern  . Not on file  Social History Narrative   Retired from [employer] in [place] and moved here from [place]   2 daughter and 2 sons   Likes to read and do puzzles   Family History  Problem Relation Age of Onset  . Heart failure Mother        66  . Heart failure Father        57  . Breast cancer Unknown   . Lung cancer Unknown   . Stroke Unknown    Scheduled Meds: . allopurinol  200 mg Oral Daily  . amoxicillin-clavulanate  1 tablet Oral BID  . dextromethorphan-guaiFENesin  1 tablet Oral BID  . furosemide  20 mg Intravenous Daily  . metoprolol tartrate  25 mg Oral BID  . pantoprazole  40 mg Oral Daily  . PARoxetine  10 mg Oral Daily   Continuous Infusions: PRN Meds:.acetaminophen, docusate sodium,  HYDROcodone-acetaminophen, ipratropium-albuterol, LORazepam, morphine CONCENTRATE, ondansetron, polyethylene glycol Medications Prior to Admission:  Prior to Admission medications   Medication Sig Start Date End Date Taking? Authorizing Provider  acetaminophen (TYLENOL) 500 MG tablet Take 500-1,000 mg by mouth every 8 (eight) hours as needed for moderate pain.    Yes [provider]  allopurinol (ZYLOPRIM) 100 MG tablet Take 200 mg by mouth daily.   Yes [provider]  apixaban (ELIQUIS) 2.5 MG TABS tablet Take 1 tablet (2.5 mg total) by mouth 2 (two) times daily. 03/08/17  Yes Skeet Latch, MD  colchicine 0.6 MG tablet Take 0.6 mg by mouth every 12 (twelve) hours as needed (for finger pain).   Yes [provider]  docusate sodium (COLACE) 100 MG capsule Take 1 capsule (100 mg total) by mouth 2 (two) times daily. Patient taking differently: Take 100 mg by mouth 2 (two) times daily as needed for mild constipation.  09/06/17  Yes Pokhrel, Laxman, MD  furosemide (LASIX) 20 MG tablet Take 1 tablet (20 mg total) by mouth daily. Take with the 40 mg tablet Patient taking differently: Take 40 mg by mouth daily.  09/08/17  Yes Pokhrel, Laxman, MD  guaiFENesin (MUCINEX) 600 MG 12 hr tablet Take 600 mg by mouth 2 (two) times daily.   Yes [provider]  HYDROcodone-acetaminophen (NORCO/VICODIN) 5-325 MG tablet Take 1 tablet by mouth every 6 (six) hours as needed for moderate pain. 09/06/17  Yes Pokhrel, Laxman, MD  ipratropium-albuterol (DUONEB) 0.5-2.5 (3) MG/3ML SOLN Take 3 mLs by nebulization every 12 (twelve) hours.   Yes [provider]  ipratropium-albuterol (DUONEB) 0.5-2.5 (3) MG/3ML SOLN Take 3 mLs by nebulization every 4 (four) hours as needed (wheezing or SOB).   Yes [provider]  levofloxacin (LEVAQUIN) 500 MG tablet Take 500 mg by mouth daily.   Yes [provider]  LORazepam (ATIVAN) 0.5 MG tablet Take 0.5 mg by mouth every  evening.   Yes [provider]  metoprolol tartrate (LOPRESSOR) 25 MG tablet Take 25 mg by mouth 2 (two) times daily.   Yes [provider]  ondansetron (ZOFRAN) 4 MG tablet Take 1 tablet (4 mg total) by mouth every 8 (eight) hours as needed for nausea or vomiting. 08/01/17  Yes Nafziger, Tommi Rumps, NP  pantoprazole (PROTONIX) 40 MG  tablet TAKE 1 TABLET BY MOUTH  DAILY Patient taking differently: Take 40 mg by mouth daily.  06/13/17  Yes Nafziger, Tommi Rumps, NP  PARoxetine (PAXIL) 10 MG tablet Take 1 tablet (10 mg total) by mouth daily. 07/28/17  Yes Nafziger, Tommi Rumps, NP  polyethylene glycol (MIRALAX / GLYCOLAX) packet Take 17 g by mouth daily as needed for mild constipation.   Yes [provider]  potassium chloride SA (K-DUR,KLOR-CON) 20 MEQ tablet TAKE 1 TABLET BY MOUTH  EVERY DAY Patient taking differently: Take 20 mEq by mouth daily.  10/03/17  Yes Nafziger, Tommi Rumps, NP  traZODone (DESYREL) 50 MG tablet TAKE 0.5-1 TABLETS (25-50 MG TOTAL) BY MOUTH AT BEDTIME AS NEEDED FOR SLEEP. Patient taking differently: Take 25 mg by mouth at bedtime.  06/07/17  Yes Nafziger, Tommi Rumps, NP  lisinopril (PRINIVIL,ZESTRIL) 20 MG tablet Take 1 tablet (20 mg total) by mouth daily. Patient not taking: Reported on 01/02/2018 09/08/17   Pokhrel, Corrie Mckusick, MD  metolazone (ZAROXOLYN) 2.5 MG tablet TAKE 1 TABLET (2.5 MG TOTAL) BY MOUTH ONCE A WEEK. TAKE WEEKLY ON TUESDAY Patient not taking: Reported on 01/02/2018 09/07/17 01/02/26  Skeet Latch, MD  metoprolol succinate (TOPROL-XL) 100 MG 24 hr tablet TAKE 1 TABLET BY MOUTH  DAILY WITH BREAKFAST Patient not taking: Reported on 01/02/2018 06/12/17   Almyra Deforest, PA   Allergies  Allergen Reactions  . Demerol [Meperidine] Nausea Only  . Vicodin [Hydrocodone-Acetaminophen] Nausea And Vomiting   Review of Systems  Unable to perform ROS: Mental status change    Physical Exam  Constitutional: She is easily aroused. She appears ill.  HENT:  Head: Normocephalic and  atraumatic.  Cardiovascular: An irregularly irregular rhythm present.  Pulmonary/Chest: No accessory muscle usage. No tachypnea. No respiratory distress.  Abdominal: There is no tenderness.  Neurological: She is alert and easily aroused.  Pleasantly confused  Skin: Skin is warm and dry.  Psychiatric: Her mood appears anxious. Her speech is delayed. Cognition and memory are impaired.  Nursing note and vitals reviewed.  Vital Signs: BP (!) 128/97   Pulse (!) 104   Temp 98.2 F (36.8 C) (Oral)   Resp 15   Ht '5\' 6"'$  (1.676 m)   Wt 80.3 kg   SpO2 99%   BMI 28.57 kg/m  Pain Scale: 0-10   Pain Score: 0-No pain   SpO2: SpO2: 99 % O2 Device:SpO2: 99 % O2 Flow Rate: .O2 Flow Rate (L/min): 2 L/min  IO: Intake/output summary:   Intake/Output Summary (Last 24 hours) at 01/07/2018 1358 Last data filed at 01/07/2018 1100 Gross per 24 hour  Intake 240 ml  Output 1150 ml  Net -910 ml    LBM: Last BM Date: 01/03/18 Baseline Weight: Weight: 84 kg(from June 2019 records) Most recent weight: Weight: 80.3 kg     Palliative Assessment/Data: PPS 40%   Flowsheet Rows     Most Recent Value  Intake Tab  Referral Department  Hospitalist  Unit at Time of Referral  Med/Surg Unit  Palliative Care Primary Diagnosis  Cardiac  Palliative Care Type  New Palliative care  Reason for referral  Clarify Goals of Care  Date first seen by Palliative Care  01/07/18  Clinical Assessment  Palliative Performance Scale Score  40%  Psychosocial & Spiritual Assessment  Palliative Care Outcomes  Patient/Family meeting held?  Yes  Who was at the meeting?  patient's two daughters and one son  Palliative Care Outcomes  Clarified goals of care, Counseled regarding hospice, Improved pain interventions, Improved non-pain  symptom therapy, Provided end of life care assistance, Changed to focus on comfort, Provided psychosocial or spiritual support, ACP counseling assistance, Transitioned to hospice      Time  In: 1100 Time Out: 1210 Time Total: 82mn Greater than 50%  of this time was spent counseling and coordinating care related to the above assessment and plan.  Signed by:  MIhor Dow FNP-C Palliative Medicine Team  Phone: [phone]: [phone]  Please contact Palliative Medicine Team phone at [phone] questions and concerns.  For individual provider: See AShea Evans

## 2018-01-07 NOTE — Plan of Care (Signed)
Continue IV Lasix. O2 @ 2L via [place]. Cardiac Monitor. Palliative Consult.

## 2018-01-08 LAB — CULTURE, BLOOD (ROUTINE X 2)
CULTURE: NO GROWTH
CULTURE: NO GROWTH
Special Requests: ADEQUATE

## 2018-01-08 MED ORDER — MORPHINE SULFATE (CONCENTRATE) 10 MG/0.5ML PO SOLN: 5.0000 mg | ORAL | 0 refills | Status: AC | PRN

## 2018-01-08 MED ORDER — DM-GUAIFENESIN ER 30-600 MG PO TB12: 1.0000 | ORAL_TABLET | Freq: Two times a day (BID) | ORAL | 0 refills | Status: AC

## 2018-01-08 MED ORDER — FUROSEMIDE 20 MG PO TABS
20.0000 mg | ORAL_TABLET | Freq: Every day | ORAL | Status: DC
  Administered 2018-01-08: 20 mg via ORAL
  Filled 2018-01-08: qty 1

## 2018-01-08 NOTE — Discharge Summary (Signed)
Physician Discharge Summary  Alyssa Cameron WUJ:[id number] DOB: [birth year] DOA: 01/02/2018  PCP: Shirline Frees, NP  Admit date: 01/02/2018 Discharge date: 01/08/2018  Admitted From: Home.  Disposition:  [facility] with Hospice.   Recommendations for Outpatient Follow-up:   Please follow up with hospice services at SNF.   Discharge Condition:hospice.  CODE STATUS:DNR Diet recommendation: comfort feeds.   Brief/Interim Summary: Alyssa Ryanis a 84 y.o.femalewith medical history significant ofhypertension, diastolic CHF, CKD, anemia due to CKD, atrial fibrillation on Eliquis, recent pneumonia presenting to the hospital from Nursing home with cough, congestion, low-grade fevers for the past 4 days.  Temperature 100.5 F, tachycardic, tachypnea on arrival..Not hypotensive on admission. Blood glucose 157. Labs showing no leukocytosis.Lactic acid normal. Hemoglobin 7.6, repeat 8.2(baseline 7-8).BNP 446. I-STAT troponin negative. EKG showing A. fib with RVR (heart rate 135).CT angios chest/abdomen/pelvis CT angios chest/abdomen/pelvis showing moderate to large rectus sheath hematoma without evidence of active extravasation. Alsoshowing small to moderate bilateral pleural effusions andmild pulmonary edema.   Pt admitted to [facility] for evaluation of rectus sheath hematoma and mildacute hypoxic toxic respiratory failure secondary to acute decompensated diastolic heart failure.  Due to multiple comorbidities patient's daughters requested for palliative care consult and after discussion with the patient and family they have transition her to comfort measures at this time.  Discharge Diagnoses:  Principal Problem:   SIRS (systemic inflammatory response syndrome) (HCC) Active Problems:   HTN (hypertension)   Acute encephalopathy   Acute hypoxemic respiratory failure (HCC)   Acute diastolic (congestive) heart failure (HCC)   Atrial fibrillation with RVR (HCC)   Rectus sheath hematoma,  initial encounter   Chronic anemia   CKD (chronic kidney disease) stage 3, GFR 30-59 ml/min (HCC)   Elevated alkaline phosphatase level   Gout   GERD (gastroesophageal reflux disease)   Depression   Palliative care by specialist   Goals of care, counseling/discussion   Acute pulmonary edema (HCC)   Anxiety state   Acute hypoxemic respiratory failure secondary to acute on chronic systolic and diastolic heart failure:  Slowly diuresing with low dose lasix ,Resume the low dose lasix without any escalation of care at this time.  Echocardiogram reviewed and discussed with the patient and family. Repeat CXR continues to show interstitial edema. She is currently on 3 lit of [place] oxygen.  Discussed in detail regarding the patient's prognosis , and family requested to speak with palliative care consult for goals of care and after discussion they have decided to transition to comfort measures.    SIRS:  Fevers resolved. Her wbc count remains wnl.  Lactic acid is wnl.  CT angio shows diffuse pulm edema with mod effusions.  He was started on [place] spectrum IV antibiotics, no fever, transition to oral antibiotics and discontinued them today.Marland Kitchen SLP evaluation recommending dysphagia 3 diet.   Blood cultures have been negative so far.  UA does not indicate infection.    Acute encephalopathy:  Suspect metabolic from fever and fluid overload.  She is alert and oriented at this time.   Rectus sheath hematoma:  Spontaneous. Pt denies any pain.  Anti Coagulation discontinued.   Atrial fib with RVR Rate is better controlled. Transitioned off cardizem gtt and started her on oral metoprolol.  No escalation of care at this time.  Acute anemia of blood loss from rectal sheath hematoma and Anemia of chronic disease: - transfuse 1 unit of prbc transfusion followed by lasix 1v 20 Mg once dose.  - Hemoglobin is stable around 9.  Patient and  family would like to stop blood work.   GERD Stable.    Stage 3 CKD:  Creatinine between 1.1 to 1.2 , stable   Hypertension: well controlled.   Hypokalemia Replaced.    Discharge Instructions  Discharge Instructions    Diet - low sodium heart healthy   Complete by:  As directed    Discharge instructions   Complete by:  As directed    Please follow up with hospice services at [facility].     Allergies as of 01/08/2018      Reactions   Demerol [meperidine] Nausea Only   Vicodin [hydrocodone-acetaminophen] Nausea And Vomiting      Medication List    STOP taking these medications   allopurinol 100 MG tablet Commonly known as:  ZYLOPRIM   apixaban 2.5 MG Tabs tablet Commonly known as:  ELIQUIS   colchicine 0.6 MG tablet   guaiFENesin 600 MG 12 hr tablet Commonly known as:  MUCINEX   levofloxacin 500 MG tablet Commonly known as:  LEVAQUIN   lisinopril 20 MG tablet Commonly known as:  PRINIVIL,ZESTRIL   metolazone 2.5 MG tablet Commonly known as:  ZAROXOLYN   metoprolol succinate 100 MG 24 hr tablet Commonly known as:  TOPROL-XL     TAKE these medications   acetaminophen 500 MG tablet Commonly known as:  TYLENOL Take 500-1,000 mg by mouth every 8 (eight) hours as needed for moderate pain.   dextromethorphan-guaiFENesin 30-600 MG 12hr tablet Commonly known as:  MUCINEX DM Take 1 tablet by mouth 2 (two) times daily.   docusate sodium 100 MG capsule Commonly known as:  COLACE Take 1 capsule (100 mg total) by mouth 2 (two) times daily. What changed:    when to take this  reasons to take this   furosemide 20 MG tablet Commonly known as:  LASIX Take 1 tablet (20 mg total) by mouth daily. Take with the 40 mg tablet What changed:    how much to take  additional instructions   HYDROcodone-acetaminophen 5-325 MG tablet Commonly known as:  NORCO/VICODIN Take 1 tablet by mouth every 6 (six) hours as needed for moderate pain.   ipratropium-albuterol 0.5-2.5 (3) MG/3ML Soln Commonly  known as:  DUONEB Take 3 mLs by nebulization every 12 (twelve) hours.   ipratropium-albuterol 0.5-2.5 (3) MG/3ML Soln Commonly known as:  DUONEB Take 3 mLs by nebulization every 4 (four) hours as needed (wheezing or SOB).   LORazepam 0.5 MG tablet Commonly known as:  ATIVAN Take 0.5 mg by mouth every evening.   metoprolol tartrate 25 MG tablet Commonly known as:  LOPRESSOR Take 25 mg by mouth 2 (two) times daily.   morphine CONCENTRATE 10 MG/0.5ML Soln concentrated solution Place 0.25 mLs (5 mg total) under the tongue every 4 (four) hours as needed for moderate pain, severe pain or shortness of breath.   ondansetron 4 MG tablet Commonly known as:  ZOFRAN Take 1 tablet (4 mg total) by mouth every 8 (eight) hours as needed for nausea or vomiting.   pantoprazole 40 MG tablet Commonly known as:  PROTONIX TAKE 1 TABLET BY MOUTH  DAILY   PARoxetine 10 MG tablet Commonly known as:  PAXIL Take 1 tablet (10 mg total) by mouth daily.   polyethylene glycol packet Commonly known as:  MIRALAX / GLYCOLAX Take 17 g by mouth daily as needed for mild constipation.   potassium chloride SA 20 MEQ tablet Commonly known as:  K-DUR,KLOR-CON TAKE 1 TABLET BY MOUTH  EVERY DAY   traZODone  50 MG tablet Commonly known as:  DESYREL TAKE 0.5-1 TABLETS (25-50 MG TOTAL) BY MOUTH AT BEDTIME AS NEEDED FOR SLEEP. What changed:    how much to take  when to take this       Contact information for follow-up providers    Shirline Frees, NP. Schedule an appointment as soon as possible for a visit in 1 week(s).   Specialty:  Family Medicine Contact information: [address] [place] [address] [phone]        Chilton Si, MD .   Specialty:  Cardiology Contact information: [address] [place] [address] [place] [address] [phone]            Contact information for after-discharge care    Destination    [facility] Preferred SNF .   Service:   Skilled Nursing Contact information: [address] [place] [address] [phone]                 Allergies  Allergen Reactions  . Demerol [Meperidine] Nausea Only  . Vicodin [Hydrocodone-Acetaminophen] Nausea And Vomiting    Consultations:  Palliative care.    Procedures/Studies: Dg Chest Port 1 View  Result Date: 01/05/2018 CLINICAL DATA:  Evaluate for pulmonary edema.  Cough and congestion. EXAM: PORTABLE CHEST 1 VIEW COMPARISON:  CT 01/02/2018.  Chest x-ray 09/04/2017. FINDINGS: Cardiomegaly with progressive bilateral pulmonary interstitial prominence suggesting congestive heart failure with interstitial edema. Bilateral mid lung field subsegmental atelectasis. Small bilateral pleural effusions. No pneumothorax. IMPRESSION: 1. Cardiomegaly with progressive bilateral pulmonary interstitial prominence suggesting congestive heart failure with interstitial edema. Small bilateral pleural effusions. 2.  Bilateral mid lung field subsegmental atelectasis. Electronically Signed   By: Maisie Fus  Register   On: 01/05/2018 13:27   Ct Angio Chest/abd/pel For Dissection W And/or Wo Contrast  Result Date: 01/03/2018 CLINICAL DATA:  Abd pain, acute, generalized. 84 year old sent for evaluation of rapid heart rate with reddened area to abdomen. EXAM: CT ANGIOGRAPHY CHEST, ABDOMEN AND PELVIS TECHNIQUE: Multidetector CT imaging through the chest, abdomen and pelvis was performed using the standard protocol during bolus administration of intravenous contrast. Multiplanar reconstructed images and MIPs were obtained and reviewed to evaluate the vascular anatomy. CONTRAST:  80mL ISOVUE-370 IOPAMIDOL (ISOVUE-370) INJECTION 76% COMPARISON:  Chest radiograph 09/04/2017.  No prior CT. FINDINGS: CTA CHEST FINDINGS Cardiovascular: Aortic atherosclerosis and tortuosity without dissection, hematoma, acute aortic injury or aneurysm. There are no filling defects in the central pulmonary  arteries. Multi chamber cardiomegaly. No pericardial effusion. There is right jugular venous distention. Mediastinum/Nodes: Hilar evaluation limited by volume loss, suspect borderline bilateral hilar nodes. Small mediastinal nodes not enlarged by size criteria. Patulous upper esophagus. Small hiatal hernia. No visualized thyroid nodule. Lungs/Pleura: Small to moderate bilateral pleural effusions with adjacent compressive atelectasis. Pleural fluid tracks along the fissures. There is bilateral perihilar volume loss with scattered ground-glass opacities, nonspecific. No definite septal thickening. Trachea and mainstem bronchi are grossly patent, partially obscured by motion. Musculoskeletal: There are no acute or suspicious osseous abnormalities. Degenerative change in the spine. Degenerative change of both shoulders. Review of the MIP images confirms the above findings. CTA ABDOMEN AND PELVIS FINDINGS VASCULAR Aorta: Diffuse aortic atherosclerosis without dissection or aneurysm. No acute aortic abnormality. Celiac: Plaque at the origin without significant stenosis. No dissection. SMA: Plaque at the origin without significant stenosis. No dissection. Vessels are patent. Renals: Widely patent right renal artery. Mild plaque at the origin the left renal artery with less than 50% stenosis. No dissection.  IMA: Patent without evidence of aneurysm, dissection, vasculitis or significant stenosis. Inflow: Atherosclerosis and tortuosity. No dissection or significant stenosis. Abdominal wall: Left rectus sheath hematoma without active extravasation. Veins: No obvious venous abnormality within the limitations of this arterial phase study. Review of the MIP images confirms the above findings. NON-VASCULAR Hepatobiliary: Capsular contours are nodular raising concern for cirrhosis. No discrete focal lesion on arterial phase imaging. Gallbladder surgically absent. Common bile duct measures 11 mm, borderline for postcholecystectomy  state. Pancreas: No ductal dilatation or inflammation. Spleen: Normal in size and arterial phase enhancement. Adrenals/Urinary Tract: Normal adrenal glands. No hydronephrosis or perinephric edema. 2.1 cm cyst in the mid left kidney. Bladder is partially distended, question of perivesicular edema bladder wall thickening. Stomach/Bowel: Small hiatal hernia. Stomach is nondistended limiting assessment. No bowel obstruction, inflammation or evident wall thickening. Moderate stool throughout the colon with stool distending the rectum. Mild distal colonic diverticulosis without diverticulitis. Appendix not visualized, reported history of appendectomy. Lymphatic: No bulky adenopathy. Reproductive: Status post hysterectomy. No adnexal masses. Other: Heterogeneous enlargement of the left rectus sheath consistent with rectus sheath hematoma. This spans approximately 14 mm in craniocaudal dimension and 4.7 cm in depth. Moderate to marked diffuse body wall edema, confluent edema in the right flank. No intra-abdominal ascites or free air. Musculoskeletal: Scoliosis and degenerative change in the spine. There are no acute or suspicious osseous abnormalities. Review of the MIP images confirms the above findings. IMPRESSION: 1. Moderate to large left rectus sheath hematoma without evidence of active extravasation. 2. Diffuse thoracoabdominal aortic atherosclerosis without acute aortic abnormality. 3. Small to moderate bilateral pleural effusions. Multi chamber cardiomegaly. Scattered ground-glass opacities both lungs may represent mild pulmonary edema but are nonspecific. There is diffuse body wall edema, constellation of findings consistent with fluid overload. 4. Suspected urinary bladder wall thickening, recommend correlation with urinalysis. 5. Nodular hepatic contours suspicious for cirrhosis. 6. Colonic diverticulosis without diverticulitis. Electronically Signed   By: Narda Rutherford M.D.   On: 01/03/2018 00:22        Subjective: No chest pain or sob.   Discharge Exam: Vitals:   01/07/18 0800 01/07/18 1200  BP:    Pulse:  (!) 104  Resp: 19 15  Temp:    SpO2:  99%   Vitals:   01/07/18 0012 01/07/18 0443 01/07/18 0800 01/07/18 1200  BP:      Pulse:    (!) 104  Resp:   19 15  Temp: 98.2 F (36.8 C)     TempSrc: Oral     SpO2:    99%  Weight:  80.3 kg    Height:        General: Pt is alert, awake, not in acute distress Cardiovascular: RRR, S1/S2 +, no rubs, no gallops Respiratory: CTA bilaterally, no wheezing, no rhonchi Abdominal: Soft, NT, ND, bowel sounds + Extremities: no edema, no cyanosis    The results of significant diagnostics from this hospitalization (including imaging, microbiology, ancillary and laboratory) are listed below for reference.     Microbiology: Recent Results (from the past 240 hour(s))  Blood Culture (routine x 2)     Status: None (Preliminary result)   Collection Time: 01/03/18  1:30 AM  Result Value Ref Range Status   Specimen Description BLOOD RIGHT FOREARM  Final   Special Requests   Final    BOTTLES DRAWN AEROBIC AND ANAEROBIC Blood Culture adequate volume   Culture   Final    NO GROWTH 4 DAYS Performed at [facility]  Lab, [address], [place], [place] [address]    Report Status PENDING  Incomplete  Blood Culture (routine x 2)     Status: None (Preliminary result)   Collection Time: 01/03/18  1:38 AM  Result Value Ref Range Status   Specimen Description BLOOD RIGHT FOREARM  Final   Special Requests   Final    BOTTLES DRAWN AEROBIC AND ANAEROBIC Blood Culture results may not be optimal due to an inadequate volume of blood received in culture bottles   Culture   Final    NO GROWTH 4 DAYS Performed at [facility], [address] [place], [place] [address]    Report Status PENDING  Incomplete  Culture, Urine     Status: Abnormal   Collection Time: 01/03/18  4:50 AM  Result Value Ref Range Status   Specimen Description  URINE, RANDOM  Final   Special Requests NONE  Final   Culture (A)  Final    <10,000 COLONIES/mL INSIGNIFICANT GROWTH Performed at [facility], [address] [place], [place] [address]    Report Status 01/04/2018 FINAL  Final  MRSA PCR Screening     Status: None   Collection Time: 01/03/18  4:16 PM  Result Value Ref Range Status   MRSA by PCR NEGATIVE NEGATIVE Final    Comment:        The GeneXpert MRSA Assay (FDA approved for NASAL specimens only), is one component of a comprehensive MRSA colonization surveillance program. It is not intended to diagnose MRSA infection nor to guide or monitor treatment for MRSA infections. Performed at [facility], [address], [place], [place] [address]      Labs: BNP (last 3 results) Recent Labs    08/31/17 2307 01/02/18 2310  BNP 472.5* 446.9*   Basic Metabolic Panel: Recent Labs  Lab 01/02/18 2310 01/02/18 2314 01/03/18 0450 01/04/18 0812 01/05/18 1000 01/06/18 0514  NA 134* 136 136 135 134* 135  K 3.7 3.7 3.7 3.5 3.3* 3.4*  CL 102 99 103 102 100 101  CO2 25  --  24 27 26 25   GLUCOSE 157* 155* 139* 108* 220* 120*  BUN 30* 30* 27* 24* 23 20  CREATININE 1.09* 1.10* 1.12* 1.21* 1.27* 1.13*  CALCIUM 8.8*  --  8.7* 8.8* 8.6* 8.8*  MG  --   --   --  1.8  --   --    Liver Function Tests: Recent Labs  Lab 01/02/18 2310  AST 17  ALT 13  ALKPHOS 143*  BILITOT 1.1  PROT 5.2*  ALBUMIN 2.7*   No results for input(s): LIPASE, AMYLASE in the last 168 hours. No results for input(s): AMMONIA in the last 168 hours. CBC: Recent Labs  Lab 01/02/18 2310 01/02/18 2314 01/03/18 0450 01/04/18 0812 01/05/18 1000 01/06/18 0514  WBC 10.3  --  9.1 8.6 13.5* 12.3*  NEUTROABS 8.4*  --   --   --   --   --   HGB 7.6* 8.2* 7.1* 7.0* 8.6* 9.0*  HCT 24.5* 24.0* 23.4* 22.9* 28.0* 29.5*  MCV 101.7*  --  104.0* 101.8* 98.9 99.0  PLT 215  --  198 191 214 223   Cardiac Enzymes: No results for input(s): CKTOTAL, CKMB,  CKMBINDEX, TROPONINI in the last 168 hours. BNP: Invalid input(s): POCBNP CBG: No results for input(s): GLUCAP in the last 168 hours. D-Dimer No results for input(s): DDIMER in the last 72 hours. Hgb A1c No results for input(s):  HGBA1C in the last 72 hours. Lipid Profile No results for input(s): CHOL, HDL, LDLCALC, TRIG, CHOLHDL, LDLDIRECT in the last 72 hours. Thyroid function studies No results for input(s): TSH, T4TOTAL, T3FREE, THYROIDAB in the last 72 hours.  Invalid input(s): FREET3 Anemia work up No results for input(s): VITAMINB12, FOLATE, FERRITIN, TIBC, IRON, RETICCTPCT in the last 72 hours. Urinalysis    Component Value Date/Time   COLORURINE YELLOW 01/03/2018 0450   APPEARANCEUR CLEAR 01/03/2018 0450   LABSPEC 1.027 01/03/2018 0450   PHURINE 5.0 01/03/2018 0450   GLUCOSEU NEGATIVE 01/03/2018 0450   HGBUR MODERATE (A) 01/03/2018 0450   BILIRUBINUR NEGATIVE 01/03/2018 0450   BILIRUBINUR n 10/20/2014 1409   KETONESUR NEGATIVE 01/03/2018 0450   PROTEINUR NEGATIVE 01/03/2018 0450   UROBILINOGEN 0.2 10/20/2014 1409   NITRITE NEGATIVE 01/03/2018 0450   LEUKOCYTESUR MODERATE (A) 01/03/2018 0450   Sepsis Labs Invalid input(s): PROCALCITONIN,  WBC,  LACTICIDVEN Microbiology Recent Results (from the past 240 hour(s))  Blood Culture (routine x 2)     Status: None (Preliminary result)   Collection Time: 01/03/18  1:30 AM  Result Value Ref Range Status   Specimen Description BLOOD RIGHT FOREARM  Final   Special Requests   Final    BOTTLES DRAWN AEROBIC AND ANAEROBIC Blood Culture adequate volume   Culture   Final    NO GROWTH 4 DAYS Performed at [facility], [address] [place], [place] [address]    Report Status PENDING  Incomplete  Blood Culture (routine x 2)     Status: None (Preliminary result)   Collection Time: 01/03/18  1:38 AM  Result Value Ref Range Status   Specimen Description BLOOD RIGHT FOREARM  Final   Special Requests   Final    BOTTLES  DRAWN AEROBIC AND ANAEROBIC Blood Culture results may not be optimal due to an inadequate volume of blood received in culture bottles   Culture   Final    NO GROWTH 4 DAYS Performed at [facility], [address], [place], [place] [address]    Report Status PENDING  Incomplete  Culture, Urine     Status: Abnormal   Collection Time: 01/03/18  4:50 AM  Result Value Ref Range Status   Specimen Description URINE, RANDOM  Final   Special Requests NONE  Final   Culture (A)  Final    <10,000 COLONIES/mL INSIGNIFICANT GROWTH Performed at [facility], [address], [place], [place] [address]    Report Status 01/04/2018 FINAL  Final  MRSA PCR Screening     Status: None   Collection Time: 01/03/18  4:16 PM  Result Value Ref Range Status   MRSA by PCR NEGATIVE NEGATIVE Final    Comment:        The GeneXpert MRSA Assay (FDA approved for NASAL specimens only), is one component of a comprehensive MRSA colonization surveillance program. It is not intended to diagnose MRSA infection nor to guide or monitor treatment for MRSA infections. Performed at [facility], [address], [place], [place] [address]      Time coordinating discharge: 32 minutes  SIGNED:   Kathlen Mody, MD  [facility] 01/08/2018, 8:56 AM Pager   If 7PM-7AM, please contact night-coverage [email or web address] Password [email or web address]

## 2018-01-08 NOTE — Clinical Social Work Placement (Signed)
   CLINICAL SOCIAL WORK PLACEMENT  NOTE  Date:  01/08/2018  Patient Details  Name: Alyssa Cameron MRN: [id number] Date of Birth: Aug 09, 1918  Clinical Social Work is seeking post-discharge placement for this patient at the Skilled  Nursing Facility level of care (*CSW will initial, date and re-position this form in  chart as items are completed):  Yes   Patient/family provided with [facility] Clinical Social Work Department's list of facilities offering this level of care within the geographic area requested by the patient (or if unable, by the patient's family).  Yes   Patient/family informed of their freedom to choose among providers that offer the needed level of care, that participate in Medicare, Medicaid or managed care program needed by the patient, have an available bed and are willing to accept the patient.  Yes   Patient/family informed of [facility]'s ownership interest in [facility] and [facility], as well as of the fact that they are under no obligation to receive care at these facilities.  PASRR submitted to EDS on       PASRR number received on       Existing PASRR number confirmed on 01/05/18     FL2 transmitted to all facilities in geographic area requested by pt/family on 01/05/18     FL2 transmitted to all facilities within larger geographic area on       Patient informed that his/her managed care company has contracts with or will negotiate with certain facilities, including the following:  [facility]         Patient/family informed of bed offers received.  Patient chooses bed at [facility]     Physician recommends and patient chooses bed at      Patient to be transferred to [facility] on 01/08/18.  Patient to be transferred to facility by [facility]     Patient family notified on 01/08/18 of transfer.  Name of family member notified:  Shea Stakes, daughter     PHYSICIAN Please sign DNR     Additional  Comment:    _______________________________________________ Abigail Butts, LCSW 01/08/2018, 9:59 AM

## 2018-01-08 NOTE — Progress Notes (Signed)
Report given to [facility]. EMS here to transport pt.

## 2018-01-08 NOTE — Progress Notes (Signed)
Speech Language Pathology Discharge Patient Details Name: Alyssa Cameron MRN: [id number] DOB: 09-10-1918 Today's Date: 01/08/2018 Time:  10:08  Patient discharged from SLP services secondary to transitioning to comfort care.  Please see latest therapy progress note for current level of functioning and progress toward goals.    Progress and discharge plan discussed with patient and/or caregiver: RN GO     Lindalou Hose Knik-[place]view Grosser, MA, CCC-SLP 01/08/2018 10:09 AM  01/08/2018, 10:08 AM

## 2018-01-08 NOTE — Progress Notes (Signed)
Patient will discharge back to [facility] with hospice services following [facility]). Anticipated discharge date: 01/08/18 Family notified: Shea Stakes, daughter Transportation by: Sharin Mons  Nurse to call report to [phone]. Patient will return to room 303B at the facility.  CSW signing off.  Abigail Butts, LCSWA  Clinical Social Worker

## 2018-02-11 DEATH — deceased

## 2019-08-13 IMAGING — DX DG CHEST 1V
1 series · 1 of 1 positions shown · non-contrast
Comparison: Prior radiograph from 07/28/2017.

CLINICAL DATA: Initial preoperative evaluation.

EXAM:
CHEST  1 VIEW

[chest ap]
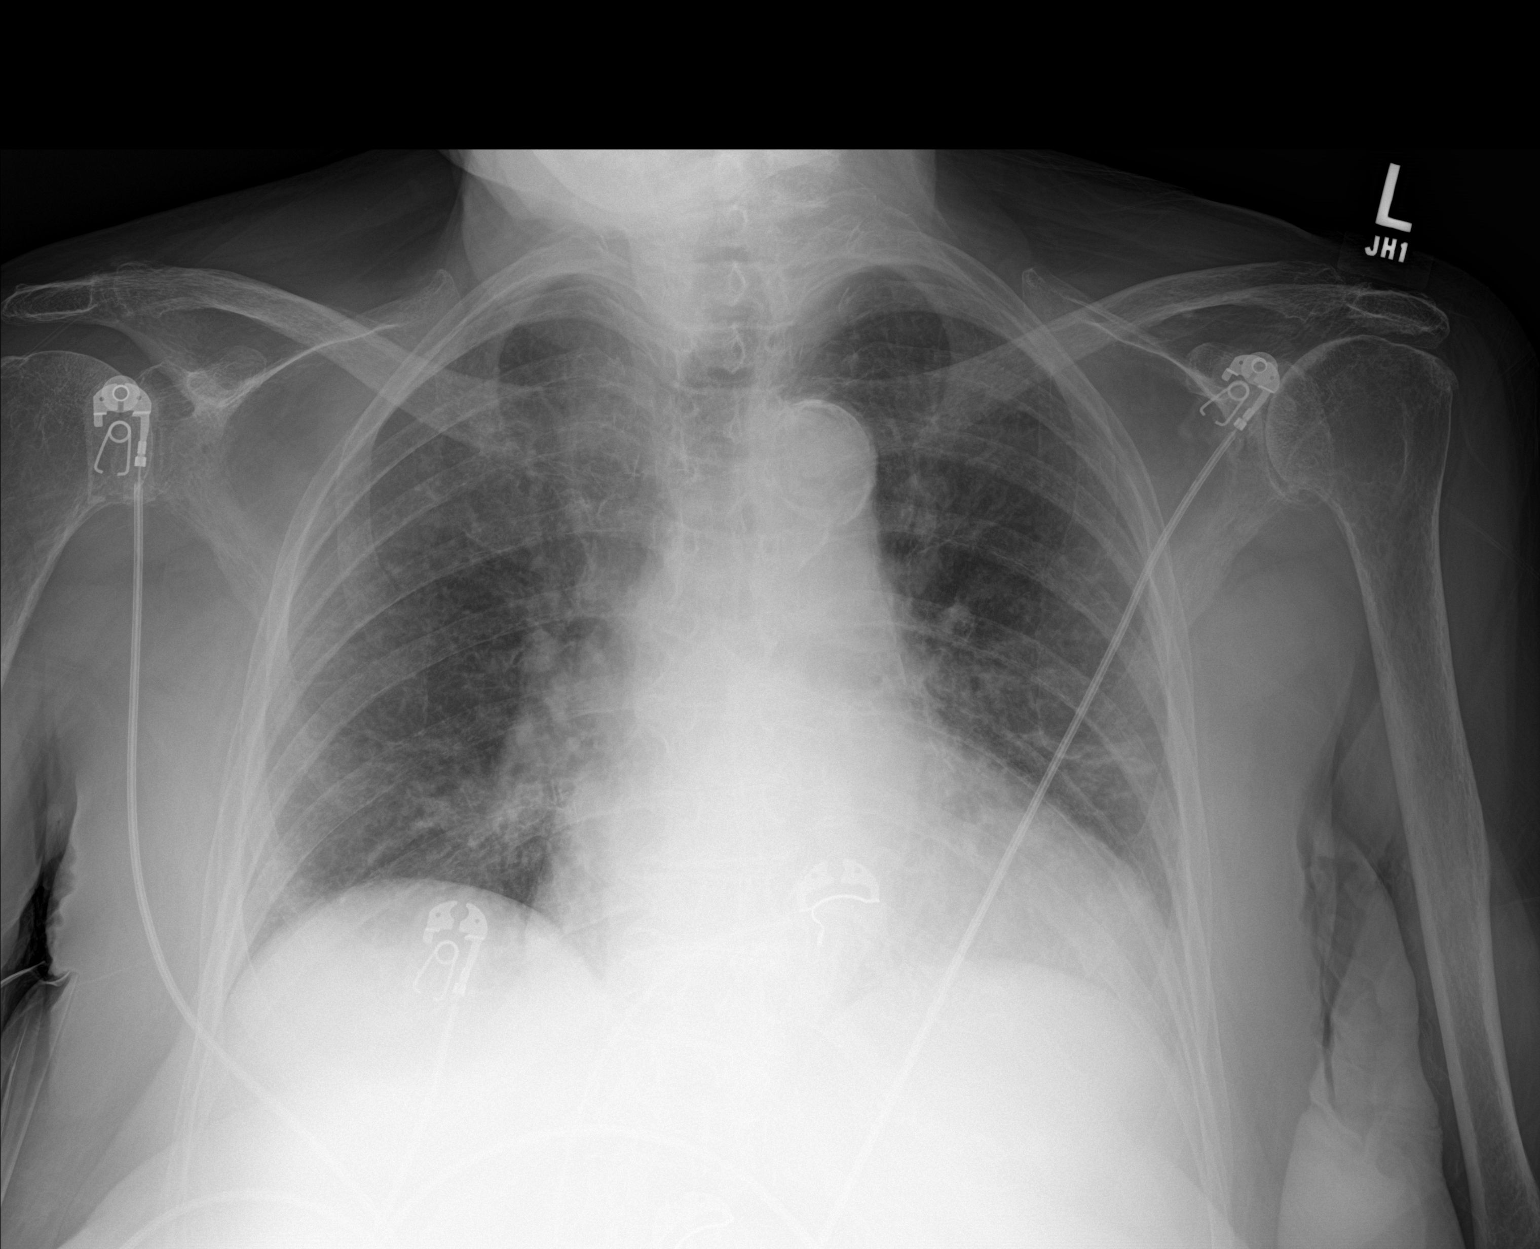

[1 of 1 positions shown; findings below may reference images not displayed]

FINDINGS: Cardiomegaly, stable. Mediastinal silhouette within normal limits.
Aortic atherosclerosis.

Lungs hypoinflated. Diffuse pulmonary vascular congestion with
interstitial prominence, suggesting mild pulmonary interstitial
congestion/edema. Superimposed mild left basilar scarring, similar
to previous. No focal infiltrates. No pleural effusion. No
pneumothorax.

No acute osseous abnormality.
IMPRESSION: 1. Cardiomegaly with mild diffuse pulmonary interstitial
congestion/edema.
2. Aortic atherosclerosis.

## 2019-08-13 IMAGING — CR DG KNEE COMPLETE 4+V*R*
4 series · 4 of 4 positions shown · non-contrast
Comparison: None.

CLINICAL DATA: Fell at [HOSPITAL].  Obvious deformity.

EXAM:
RIGHT KNEE - COMPLETE 4+ VIEW

[x knee lat right]
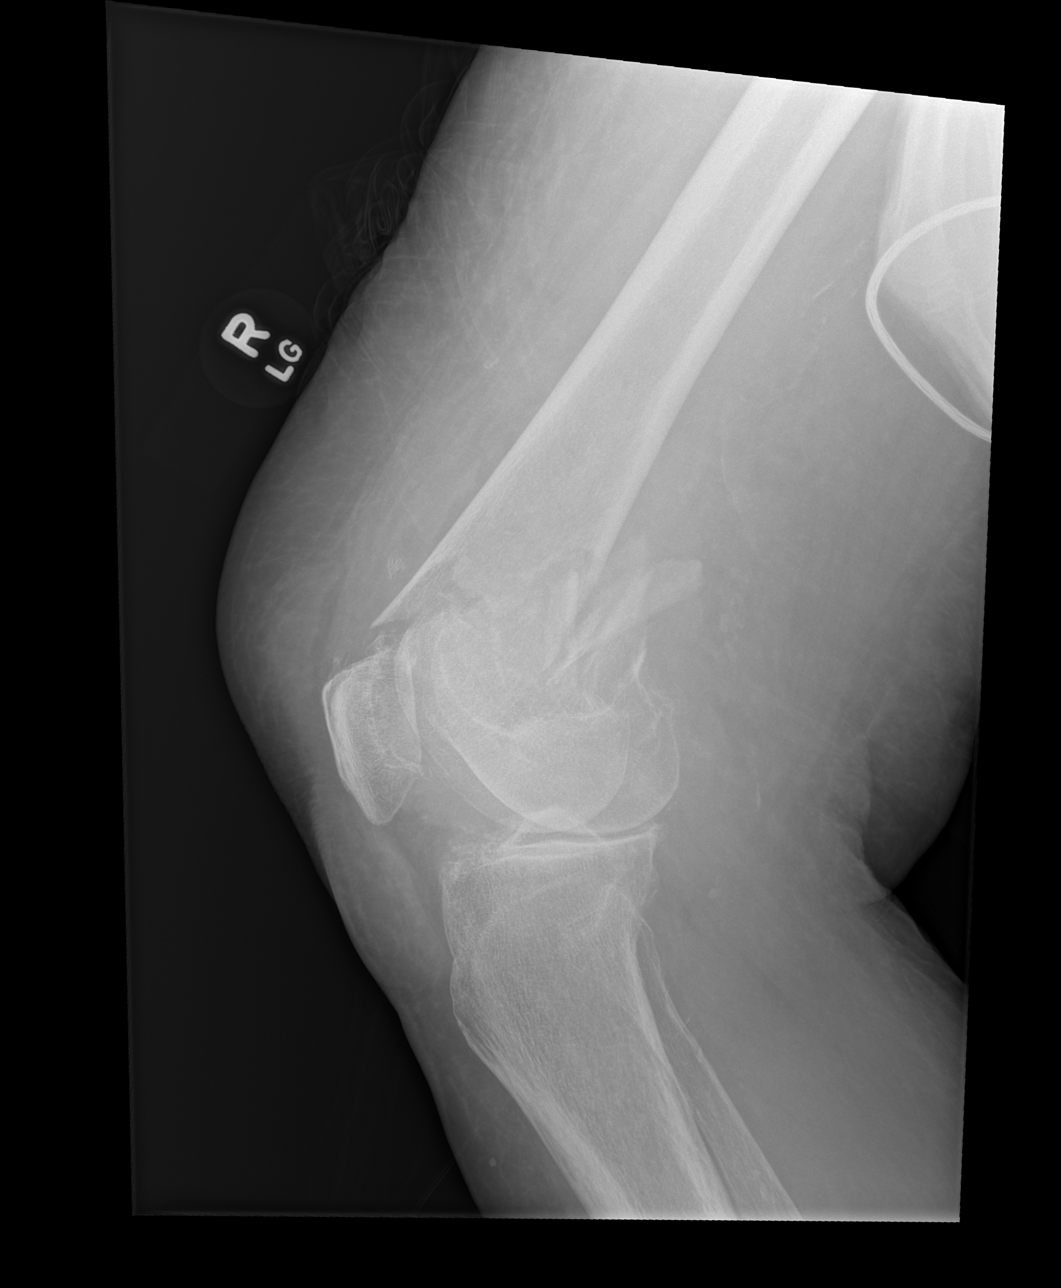

[x knee obl right (1 of 2)]
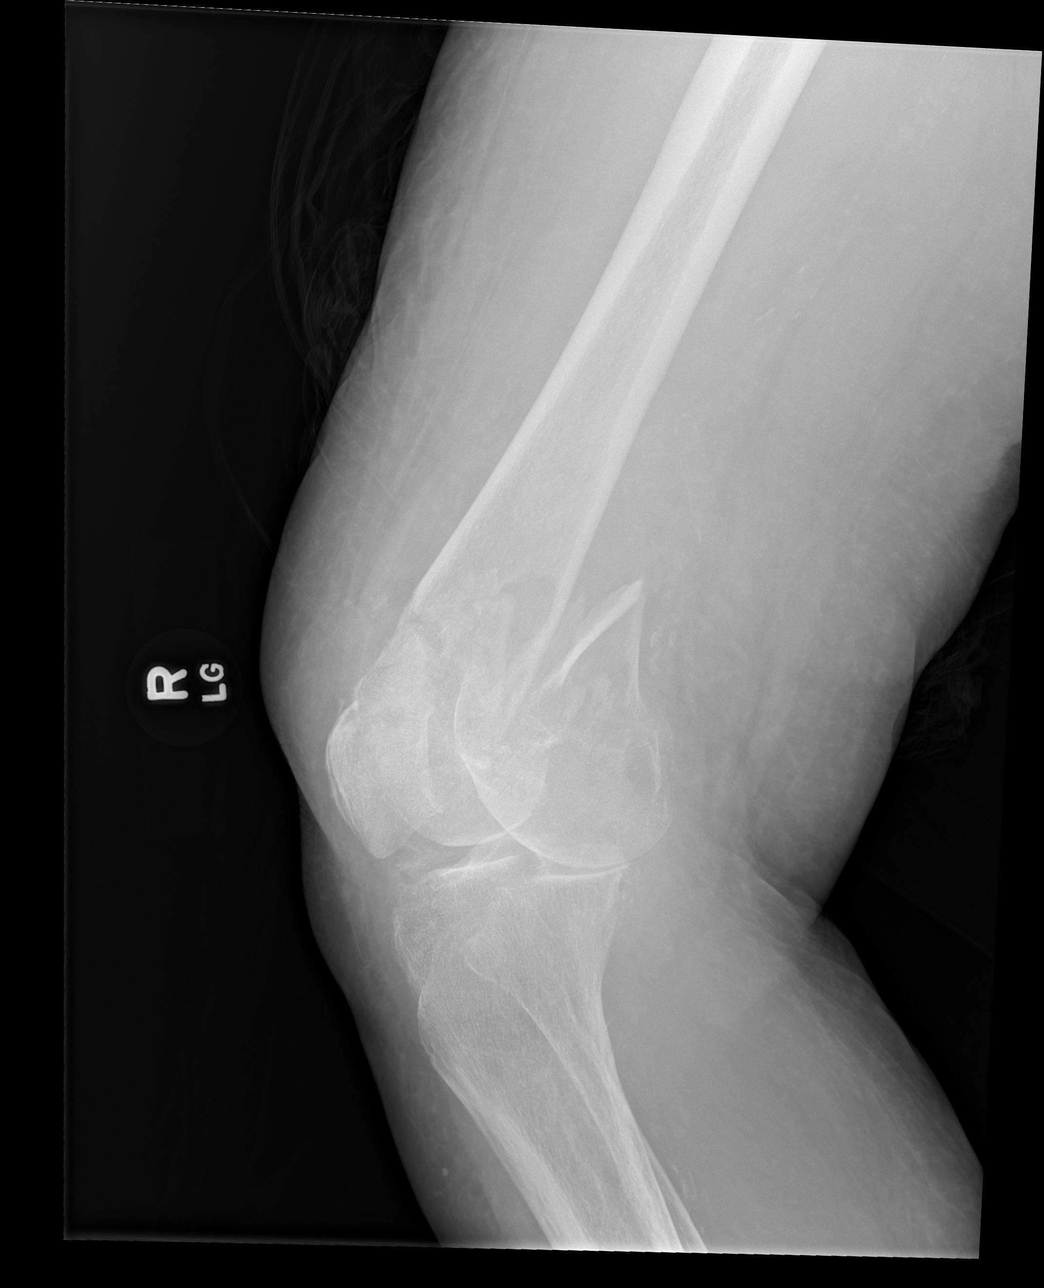

[x knee obl right (2 of 2)]
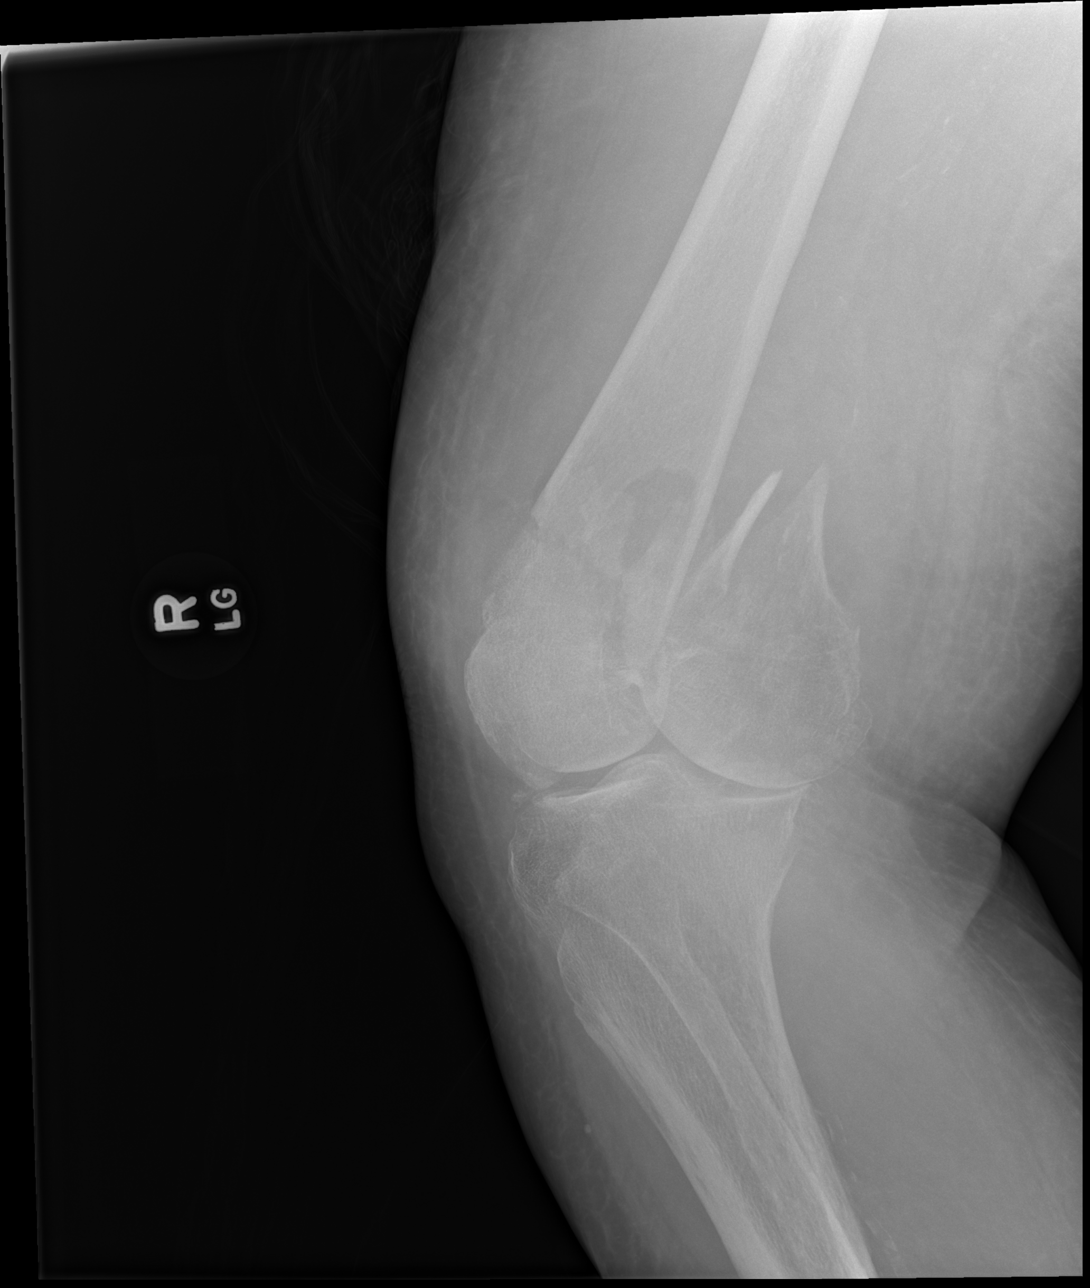

[x knee ap right]
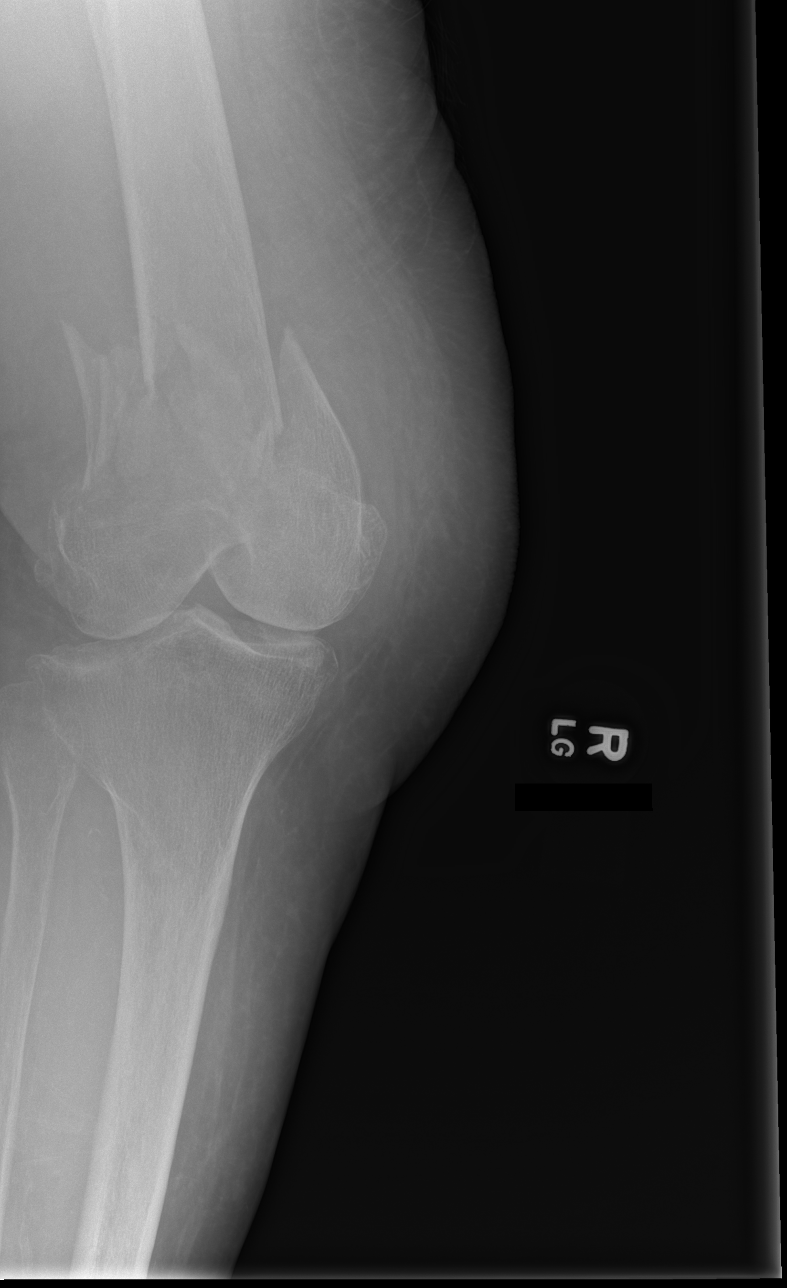

[4 of 4 positions shown; findings below may reference images not displayed]

FINDINGS: Acute comminuted impacted distal femur fracture with intra-articular
extension through the trochlea. Posterior angulation distal bony
fragments. No dislocation. No destructive bony lesions. Osteopenia.
Soft tissue swelling and joint effusion. Mild vascular
calcifications.
IMPRESSION: Acute displaced distal femur fracture.  No dislocation.
